# Patient Record
Sex: Male | Born: 1955
Health system: Southern US, Community
[De-identification: ages and names within clinical notes are randomized; demographics above are authoritative.]

## PROBLEM LIST (undated history)

## (undated) DIAGNOSIS — K922 Gastrointestinal hemorrhage, unspecified: Secondary | ICD-10-CM

## (undated) DIAGNOSIS — K760 Fatty (change of) liver, not elsewhere classified: Secondary | ICD-10-CM

## (undated) DIAGNOSIS — E669 Obesity, unspecified: Secondary | ICD-10-CM

## (undated) DIAGNOSIS — H269 Unspecified cataract: Secondary | ICD-10-CM

## (undated) DIAGNOSIS — E7800 Pure hypercholesterolemia, unspecified: Secondary | ICD-10-CM

## (undated) DIAGNOSIS — K2970 Gastritis, unspecified, without bleeding: Secondary | ICD-10-CM

## (undated) DIAGNOSIS — E291 Testicular hypofunction: Secondary | ICD-10-CM

## (undated) DIAGNOSIS — E66812 Obesity, class 2: Secondary | ICD-10-CM

## (undated) DIAGNOSIS — B9681 Helicobacter pylori [H. pylori] as the cause of diseases classified elsewhere: Secondary | ICD-10-CM

## (undated) DIAGNOSIS — N200 Calculus of kidney: Secondary | ICD-10-CM

## (undated) DIAGNOSIS — K635 Polyp of colon: Secondary | ICD-10-CM

## (undated) DIAGNOSIS — M1990 Unspecified osteoarthritis, unspecified site: Secondary | ICD-10-CM

## (undated) DIAGNOSIS — K219 Gastro-esophageal reflux disease without esophagitis: Secondary | ICD-10-CM

## (undated) DIAGNOSIS — R1011 Right upper quadrant pain: Secondary | ICD-10-CM

## (undated) HISTORY — DX: Pure hypercholesterolemia, unspecified: E78.00

## (undated) HISTORY — DX: Unspecified cataract: H26.9

## (undated) HISTORY — DX: Gastritis, unspecified, without bleeding: K29.70

## (undated) HISTORY — PX: UPPER GASTROINTESTINAL ENDOSCOPY: SHX188

## (undated) HISTORY — DX: Testicular hypofunction: E29.1

## (undated) HISTORY — PX: KNEE SURGERY: SHX244

## (undated) HISTORY — DX: Right upper quadrant pain: R10.11

## (undated) HISTORY — DX: Obesity, unspecified: E66.9

## (undated) HISTORY — DX: Polyp of colon: K63.5

## (undated) HISTORY — DX: Fatty (change of) liver, not elsewhere classified: K76.0

## (undated) HISTORY — PX: COLONOSCOPY: SHX174

## (undated) HISTORY — DX: Obesity, class 2: E66.812

## (undated) HISTORY — DX: Helicobacter pylori (H. pylori) as the cause of diseases classified elsewhere: B96.81

## (undated) HISTORY — PX: POLYPECTOMY: SHX149

## (undated) HISTORY — DX: Gastro-esophageal reflux disease without esophagitis: K21.9

## (undated) HISTORY — DX: Calculus of kidney: N20.0

## (undated) HISTORY — DX: Unspecified osteoarthritis, unspecified site: M19.90

## (undated) HISTORY — PX: ESOPHAGOGASTRODUODENOSCOPY: SHX1529

## (undated) HISTORY — DX: Gastrointestinal hemorrhage, unspecified: K92.2

---

## 1976-12-11 HISTORY — PX: TONSILLECTOMY: SUR1361

## 1979-12-12 HISTORY — PX: NASAL SEPTUM SURGERY: SHX37

## 1998-12-11 HISTORY — PX: LUMBAR SPINE SURGERY: SHX701

## 1999-04-07 ENCOUNTER — Encounter: Payer: Self-pay | Admitting: Neurosurgery

## 1999-04-08 ENCOUNTER — Encounter: Payer: Self-pay | Admitting: Neurosurgery

## 1999-04-08 ENCOUNTER — Inpatient Hospital Stay (HOSPITAL_COMMUNITY): Admission: RE | Admit: 1999-04-08 | Discharge: 1999-04-09 | Payer: Self-pay | Admitting: Neurosurgery

## 1999-04-25 ENCOUNTER — Encounter: Admission: RE | Admit: 1999-04-25 | Discharge: 1999-07-22 | Payer: Self-pay | Admitting: Neurosurgery

## 1999-06-15 ENCOUNTER — Encounter: Admission: RE | Admit: 1999-06-15 | Discharge: 1999-07-22 | Payer: Self-pay | Admitting: Specialist

## 1999-08-16 ENCOUNTER — Encounter: Admission: RE | Admit: 1999-08-16 | Discharge: 1999-09-26 | Payer: Self-pay | Admitting: Specialist

## 2004-03-17 ENCOUNTER — Inpatient Hospital Stay (HOSPITAL_COMMUNITY): Admission: AD | Admit: 2004-03-17 | Discharge: 2004-03-19 | Payer: Self-pay | Admitting: Orthopedic Surgery

## 2008-01-07 ENCOUNTER — Emergency Department (HOSPITAL_COMMUNITY): Admission: EM | Admit: 2008-01-07 | Discharge: 2008-01-07 | Payer: Self-pay | Admitting: Emergency Medicine

## 2008-12-11 HISTORY — PX: CHOLECYSTECTOMY: SHX55

## 2008-12-24 ENCOUNTER — Ambulatory Visit: Payer: Self-pay | Admitting: Family Medicine

## 2008-12-24 DIAGNOSIS — R109 Unspecified abdominal pain: Secondary | ICD-10-CM | POA: Insufficient documentation

## 2008-12-25 ENCOUNTER — Ambulatory Visit: Payer: Self-pay | Admitting: Gastroenterology

## 2008-12-25 DIAGNOSIS — R1011 Right upper quadrant pain: Secondary | ICD-10-CM

## 2008-12-28 ENCOUNTER — Ambulatory Visit (HOSPITAL_COMMUNITY): Admission: RE | Admit: 2008-12-28 | Discharge: 2008-12-28 | Payer: Self-pay | Admitting: Gastroenterology

## 2008-12-28 DIAGNOSIS — K8020 Calculus of gallbladder without cholecystitis without obstruction: Secondary | ICD-10-CM | POA: Insufficient documentation

## 2008-12-28 LAB — CONVERTED CEMR LAB
ALT: 31 units/L (ref 0–53)
Basophils Absolute: 0.1 10*3/uL (ref 0.0–0.1)
CO2: 32 meq/L (ref 19–32)
Calcium: 9.4 mg/dL (ref 8.4–10.5)
Chloride: 103 meq/L (ref 96–112)
GFR calc Af Amer: 100 mL/min
Lymphocytes Relative: 26.4 % (ref 12.0–46.0)
MCHC: 35.8 g/dL (ref 30.0–36.0)
Neutrophils Relative %: 58 % (ref 43.0–77.0)
Potassium: 4.1 meq/L (ref 3.5–5.1)
RBC: 4.69 M/uL (ref 4.22–5.81)
RDW: 12.3 % (ref 11.5–14.6)
Sodium: 140 meq/L (ref 135–145)
Total Protein: 6.8 g/dL (ref 6.0–8.3)

## 2008-12-30 ENCOUNTER — Encounter: Payer: Self-pay | Admitting: Gastroenterology

## 2009-01-08 ENCOUNTER — Encounter: Payer: Self-pay | Admitting: Gastroenterology

## 2009-01-20 ENCOUNTER — Encounter: Payer: Self-pay | Admitting: Gastroenterology

## 2009-01-20 ENCOUNTER — Ambulatory Visit: Payer: Self-pay | Admitting: Gastroenterology

## 2009-01-20 DIAGNOSIS — K635 Polyp of colon: Secondary | ICD-10-CM

## 2009-01-20 HISTORY — PX: COLONOSCOPY W/ POLYPECTOMY: SHX1380

## 2009-01-20 HISTORY — DX: Polyp of colon: K63.5

## 2009-01-25 ENCOUNTER — Encounter: Payer: Self-pay | Admitting: Gastroenterology

## 2009-03-09 ENCOUNTER — Encounter (INDEPENDENT_AMBULATORY_CARE_PROVIDER_SITE_OTHER): Payer: Self-pay | Admitting: General Surgery

## 2009-03-09 ENCOUNTER — Ambulatory Visit (HOSPITAL_COMMUNITY): Admission: RE | Admit: 2009-03-09 | Discharge: 2009-03-09 | Payer: Self-pay | Admitting: General Surgery

## 2009-08-04 ENCOUNTER — Encounter: Payer: Self-pay | Admitting: Gastroenterology

## 2011-04-25 NOTE — Op Note (Signed)
NAMEODES, LOLLI NO.:  [id number]   MEDICAL RECORD NO.:  [id number]          PATIENT TYPE:  AMB   LOCATION:  DAY                          FACILITY:  [facility]   PHYSICIAN:  Sharlet Salina T. Hoxworth, M.D.DATE OF BIRTH:  Jul 08, 1956   DATE OF PROCEDURE:  03/09/2009  DATE OF DISCHARGE:                               OPERATIVE REPORT   PREOPERATIVE DIAGNOSES:  Cholelithiasis and cholecystitis.   POSTOPERATIVE DIAGNOSES:  Cholelithiasis and cholecystitis.   SURGICAL PROCEDURES:  Laparoscopic cholecystectomy with intraoperative  cholangiogram.   SURGEON:  Sharlet Salina T. Hoxworth, M.D.   ASSISTANTAngelia Mould. Derrell Lolling, M.D.   ANESTHESIA:  General.   BRIEF HISTORY:  Mr. Sabino is a 55 year old male with some persistent  episodic right upper quadrant postprandial discomfort over several  months.  He had a workup including a gallbladder ultrasound revealing  multiple gallstones.  He is felt to likely have symptomatic  cholelithiasis and I have recommended proceeding with laparoscopic  cholecystectomy with cholangiogram.  The nature of the procedure, its  indications, the risks of bleeding, infection, bile leak, bile duct  injury were discussed and understood.  He is now brought to the  operating room for this procedure.   DESCRIPTION OF OPERATION:  The patient was brought to the operating  room. placed in the supine position on the operating room table and  general endotracheal anesthesia was induced.  The abdomen was widely  sterilely prepped and draped.  Local anesthesia was used to infiltrate  the trocar sites.  A 1 cm incision was made at the umbilicus and  dissection was carried down to the midline fascia which was sharply  incised 1 cm and the peritoneum entered under direct vision.  Through a  mattress suture of zero Vicryl the Hasson trocar was placed and  pneumoperitoneum established.  Under direct vision an 11 mm trocar was  placed in the subxiphoid area and two 5  mm trocars along the right  subcostal margin.  The gallbladder was visualized and did not appear  acutely inflamed.  The fundus was grasped and elevated up over the  liver.  There were some omental adhesions up to the gallbladder that  were taken down with cautery and then the infundibulum retracted  inferolaterally.  The peritoneum anterior and posterior to Calot  triangle was incised and fibrofatty tissue was stripped off the neck of  the gallbladder toward the porta hepatis.  Calot triangle was thoroughly  dissected and the cystic artery identified coursing up on the  gallbladder wall.  The cystic duct gallbladder junction was dissected  360 degrees and the cystic duct dissected out over about a centimeter.  When the anatomy was clear the cystic artery was doubly clipped  proximally, clipped distally and the cystic duct clipped at the  gallbladder junction.  An operative cholangiogram was then obtained  through the cystic duct which showed good filling of common bile duct,  intrahepatic ducts with free flow into the duodenum and no filling  defects.  Following this, the Cholangiocath was removed and the cystic  duct was triply clipped  proximally and divided and the cystic artery was  divided.  The gallbladder then dissected free from its bed using hook  cautery and removed through the umbilicus.  The right upper quadrant was  thoroughly irrigated and complete hemostasis was assured.  The trocars  were removed and all CO2 evacuated and the mattress suture secured in  the umbilicus.  The skin incisions were closed with subcuticular Monocryl and Dermabond.  Sponge, needle and instrument counts correct.  The patient was taken to recovery in good condition.      Lorne Skeens. Hoxworth, M.D.  Electronically Signed     BTH/MEDQ  D:  03/09/2009  T:  03/09/2009  Job:  [id number]   cc:   Rachael Fee, MD  [address]  [place], [place] [address]   Donetta Potts, D.O.

## 2011-04-28 NOTE — Op Note (Signed)
NAME:  Jordan Berg, Jordan Berg NO.:  [id number]   MEDICAL RECORD NO.:  [id number]                   PATIENT TYPE:  INP   LOCATION:  5028                                 FACILITY:  [facility]   PHYSICIAN:  Katy Fitch. Naaman Plummer., M.D.          DATE OF BIRTH:  [birth year]   DATE OF PROCEDURE:  03/17/2004  DATE OF DISCHARGE:                                 OPERATIVE REPORT   PREOPERATIVE DIAGNOSIS:  Acute septic flexor tenosynovitis left index finger  status post trauma on March 15, 2004, with development of acute septic flexor  tenosynovitis identified at Occup[facility] and referred for an urgent  hand trauma activation consultation for incision and drainage.   POSTOPERATIVE DIAGNOSIS:  Acute septic flexor tenosynovitis left index  finger status post trauma on March 15, 2004, with development of acute septic  flexor tenosynovitis identified at Occupational Health and referred for an  urgent hand trauma activation consultation for incision and drainage.   OPERATION:  Incision and drainage of left index finger flexor sheath with  aerobic and anaerobic culture and subsequent antibiotic solution irrigation  with implantation of a #5 pediatric feeding tube.   SURGEON:  Katy Fitch. Sypher, M.D.   ASSISTANT:  Marveen Reeks. Dasnoit, P.A.-C.   ANESTHESIA:  General by LMA.   ANESTHESIOLOGIST:  Zenon Mayo, MD   INDICATIONS FOR PROCEDURE:  Jordan Berg is a 55 year old [employer] employee  who, on March 15, 2004, sustained a crushing injury to his left index finger.  He split the skin at the P2 segment adjacent to the distal interphalangeal  joint flexion crease and was seen by his plant nurse.  His wound was cleaned  and dressed.  He was not placed on any antibiotic therapy.  He subsequently  developed an acute flexor tenosynovitis within 36 hours and presented to  [facility] today with severe pain, swelling, and rubor.  A  hand trauma activation  consultation was requested.   On examination, he was noted to have all the cardinal signs of flexor  tenosynovitis with significant pain.  His CBC revealed an elevated white  count at 11,100.  His laboratory studies were, otherwise, within normal  limits.  Arrangements were made of urgent incision and drainage followed by  antibiotic irrigation of his flexor sheath and implantation of a pediatric  feeding tube for continuous drainage.   PROCEDURE:  Jordan Berg is brought to the operating room and placed on supine  position on the operating table.  Following induction of general anesthesia  by LMA, the left arm was prepped with Betadine solution and sterilely  draped.  Following exsanguination of the left arm by elevation for a few  minutes, the arterial tourniquet was inflated to 240 mmHg.  The procedure  commenced with a short transverse incision at the level of the A1 pulley  proximally.  The flexor sheath was identified at this level after  careful  retraction of the neurovascular bundles.  The flexor sheath was bulging with  turbid seropurulent fluid.  The membranes of the retinacular sheath were  incised and aerobic and anaerobic culture specimens were obtained.  The area  of the traumatic wound was then explored with a Brunners zig-zag incision.  Once again, purulent fluid was identified within the flexor sheath.  A #5  pediatric feeding tube was threaded deep to the A2 pulley and used to  continuously irrigate the flexor sheath until the affluent was clear.  The  catheter was then sewn in place with a 5-0 nylon suture.  The distal wound  was loosely closed to cover the flexor sheath with a single corner suture of  5-0 nylon.  The finger was dressed with Xeroform, sterile gauze, and a  voluminous dressing.  Jordan Berg was given 3 grams of Unasyn IV and will  begin an IV antibiotic regimen with Unasyn q.6h.  He will be admitted to the  5000 orthopedic floor for observation of his vital  signs and appropriate  analgesic management.                                               Katy Fitch Naaman Plummer., M.D.    RVS/MEDQ  D:  03/17/2004  T:  03/17/2004  Job:  [id number]

## 2014-01-28 ENCOUNTER — Emergency Department
Admission: EM | Admit: 2014-01-28 | Discharge: 2014-01-28 | Disposition: A | Discharge status: Home / Self Care | Payer: BC Managed Care – PPO | Source: Home / Self Care | Attending: Family Medicine | Admitting: Family Medicine

## 2014-01-28 ENCOUNTER — Encounter: Payer: Self-pay | Admitting: Emergency Medicine

## 2014-01-28 DIAGNOSIS — K219 Gastro-esophageal reflux disease without esophagitis: Secondary | ICD-10-CM

## 2014-01-28 DIAGNOSIS — R079 Chest pain, unspecified: Secondary | ICD-10-CM

## 2014-01-28 MED ORDER — OMEPRAZOLE 40 MG PO CPDR: DELAYED_RELEASE_CAPSULE | ORAL | Status: DC

## 2014-01-28 NOTE — Discharge Instructions (Signed)
Gastroesophageal Reflux Disease, Adult  Gastroesophageal reflux disease (GERD) happens when acid from your stomach flows up into the esophagus. When acid comes in contact with the esophagus, the acid causes soreness (inflammation) in the esophagus. Over time, GERD may create small holes (ulcers) in the lining of the esophagus.  CAUSES   · Increased body weight. This puts pressure on the stomach, making acid rise from the stomach into the esophagus.  · Smoking. This increases acid production in the stomach.  · Drinking alcohol. This causes decreased pressure in the lower esophageal sphincter (valve or ring of muscle between the esophagus and stomach), allowing acid from the stomach into the esophagus.  · Late evening meals and a full stomach. This increases pressure and acid production in the stomach.  · A malformed lower esophageal sphincter.  Sometimes, no cause is found.  SYMPTOMS   · Burning pain in the lower part of the mid-chest behind the breastbone and in the mid-stomach area. This may occur twice a week or more often.  · Trouble swallowing.  · Sore throat.  · Dry cough.  · Asthma-like symptoms including chest tightness, shortness of breath, or wheezing.  DIAGNOSIS   Your caregiver may be able to diagnose GERD based on your symptoms. In some cases, X-rays and other tests may be done to check for complications or to check the condition of your stomach and esophagus.  TREATMENT   Your caregiver may recommend over-the-counter or prescription medicines to help decrease acid production. Ask your caregiver before starting or adding any new medicines.   HOME CARE INSTRUCTIONS   · Change the factors that you can control. Ask your caregiver for guidance concerning weight loss, quitting smoking, and alcohol consumption.  · Avoid foods and drinks that make your symptoms worse, such as:  · Caffeine or alcoholic drinks.  · Chocolate.  · Peppermint or mint flavorings.  · Garlic and onions.  · Spicy foods.  · Citrus fruits,  such as oranges, lemons, or limes.  · Tomato-based foods such as sauce, chili, salsa, and pizza.  · Fried and fatty foods.  · Avoid lying down for the 3 hours prior to your bedtime or prior to taking a nap.  · Eat small, frequent meals instead of large meals.  · Wear loose-fitting clothing. Do not wear anything tight around your waist that causes pressure on your stomach.  · Raise the head of your bed 6 to 8 inches with wood blocks to help you sleep. Extra pillows will not help.  · Only take over-the-counter or prescription medicines for pain, discomfort, or fever as directed by your caregiver.  · Do not take aspirin, ibuprofen, or other nonsteroidal anti-inflammatory drugs (NSAIDs).  SEEK IMMEDIATE MEDICAL CARE IF:   · You have pain in your arms, neck, jaw, teeth, or back.  · Your pain increases or changes in intensity or duration.  · You develop nausea, vomiting, or sweating (diaphoresis).  · You develop shortness of breath, or you faint.  · Your vomit is green, yellow, black, or looks like coffee grounds or blood.  · Your stool is red, bloody, or black.  These symptoms could be signs of other problems, such as heart disease, gastric bleeding, or esophageal bleeding.  MAKE SURE YOU:   · Understand these instructions.  · Will watch your condition.  · Will get help right away if you are not doing well or get worse.  Document Released: 09/06/2005 Document Revised: 02/19/2012 Document Reviewed: 06/16/2011  [employer]® Patient   Information ©2014 [employer].

## 2014-01-28 NOTE — ED Notes (Signed)
Throat burning, pressure from stomach up to back of throat and roof of mouth, tenderness x 4 days

## 2014-01-28 NOTE — ED Provider Notes (Signed)
CSN: [id number]     Arrival date & time 01/28/14  1353 History   First MD Initiated Contact with Patient 01/28/14 1530     Chief Complaint  Patient presents with  . Gastrophageal Reflux        HPI Comments: Patient complains of 3 to 4 days history of recurring burning sensation in his throat, especially upon awakening, with pressure-like sensation in his abdomen radiating up to his throat.  He has had occasional nausea without vomiting.  The discomfort does not radiate.  He has mild shortness of breath when the discomfort is worse.  His symptoms are not exacerbated by activity. Family history of MI in paternal grandfather and maternal grandmother.  Patient is a 59 y.o. male presenting with chest pain. The history is provided by the patient.  Chest Pain Pain location:  Substernal area Pain quality: burning and pressure   Radiates to: throat. Pain radiates to the back: no   Pain severity:  Mild Onset quality:  Gradual Timing:  Intermittent Progression:  Waxing and waning Chronicity:  Recurrent Context: at rest   Context: not breathing, no drug use, not eating, not lifting, no movement, not raising an arm, no stress and no trauma   Relieved by:  None tried Worsened by:  Nothing tried Ineffective treatments:  None tried Associated symptoms: abdominal pain, AICD problem, dysphagia, heartburn and nausea   Associated symptoms: no anorexia, no back pain, no cough, no diaphoresis, no dizziness, no fatigue, no fever, no lower extremity edema, no palpitations, no shortness of breath and not vomiting   Risk factors: male sex and obesity     History reviewed. No pertinent past medical history. History reviewed. No pertinent past surgical history. No family history on file. History  Substance Use Topics  . Smoking status: Never Smoker   . Smokeless tobacco: Not on file  . Alcohol Use: No    Review of Systems  Constitutional: Negative for fever, diaphoresis and fatigue.  HENT: Positive  for trouble swallowing.   Respiratory: Negative for cough and shortness of breath.   Cardiovascular: Positive for chest pain. Negative for palpitations.  Gastrointestinal: Positive for heartburn, nausea and abdominal pain. Negative for vomiting and anorexia.  Musculoskeletal: Negative for back pain.  Neurological: Negative for dizziness.  All other systems reviewed and are negative.      Allergies  Levofloxacin  Home Medications   Current Outpatient Rx  Name  Route  Sig  Dispense  Refill  . Testosterone (FORTESTA) 10 MG/ACT (2%) GEL   Transdermal   Place onto the skin.         Marland Kitchen omeprazole (PRILOSEC) 40 MG capsule      Take one capsule by mouth 30 minutes before evening meal   30 capsule   1    BP 160/78  Pulse 82  Temp(Src) 97.8 F (36.6 C) (Oral)  Ht 5\' 11"  (1.803 m)  Wt 248 lb (112.492 kg)  BMI 34.60 kg/m2  SpO2 97% Physical Exam Nursing notes and Vital Signs reviewed. Appearance:  Patient appears stated age, and in no acute distress.  Patient is obese (BMI 34.6) Eyes:  Pupils are equal, round, and reactive to light and accomodation.  Extraocular movement is intact.  Conjunctivae are not inflamed  Ears:  Canals normal.  Tympanic membranes normal.  Nose:   Normal  Pharynx:  Normal Neck:  Supple.  No adenopathy or thyromegaly Lungs:  Clear to auscultation.  Breath sounds are equal.  Heart:  Regular rate and rhythm  without murmurs, rubs, or gallops.  Abdomen:   Mild sub-xiphoid tenderness without masses or hepatosplenomegaly.  Bowel sounds are present.  No CVA or flank tenderness.  Extremities:  No edema.  No calf tenderness Skin:  No rash present.   ED Course  Procedures  None  EKG: RATE:  82 PR:  0.172 QT:  0.356 QRS:  0.10 Axis:  9 degrees Interpretation:  NSR; WNL           MDM   Final diagnoses:  Chest pain  GERD (gastroesophageal reflux disease)    Begin omeprazole 40mg  30 minutes before evening meal. Followup with Family Doctor if  not improved in two weeks.    Kandra Nicolas, MD 01/30/14 1800

## 2014-03-16 ENCOUNTER — Other Ambulatory Visit: Payer: Self-pay | Admitting: Family Medicine

## 2014-04-27 ENCOUNTER — Other Ambulatory Visit: Payer: Self-pay | Admitting: Family Medicine

## 2015-12-09 ENCOUNTER — Emergency Department
Admission: EM | Admit: 2015-12-09 | Discharge: 2015-12-09 | Disposition: A | Discharge status: Home / Self Care | Payer: BLUE CROSS/BLUE SHIELD | Source: Home / Self Care | Attending: Family Medicine | Admitting: Family Medicine

## 2015-12-09 ENCOUNTER — Encounter: Payer: Self-pay | Admitting: Emergency Medicine

## 2015-12-09 DIAGNOSIS — J069 Acute upper respiratory infection, unspecified: Secondary | ICD-10-CM | POA: Diagnosis not present

## 2015-12-09 DIAGNOSIS — H6983 Other specified disorders of Eustachian tube, bilateral: Secondary | ICD-10-CM | POA: Diagnosis not present

## 2015-12-09 DIAGNOSIS — J0110 Acute frontal sinusitis, unspecified: Secondary | ICD-10-CM

## 2015-12-09 DIAGNOSIS — B9789 Other viral agents as the cause of diseases classified elsewhere: Secondary | ICD-10-CM

## 2015-12-09 MED ORDER — TRIAMCINOLONE ACETONIDE 40 MG/ML IJ SUSP
40.0000 mg | Freq: Once | INTRAMUSCULAR | Status: AC
  Administered 2015-12-09: 40 mg via INTRAMUSCULAR

## 2015-12-09 MED ORDER — CEFDINIR 300 MG PO CAPS: 300.0000 mg | ORAL_CAPSULE | Freq: Two times a day (BID) | ORAL | Status: DC

## 2015-12-09 NOTE — Discharge Instructions (Signed)
Take plain guaifenesin (1200mg  extended release tabs such as Mucinex) twice daily, with plenty of water, for cough and congestion.  May add Pseudoephedrine (30mg , one or two every 4 to 6 hours) for sinus congestion.  Get adequate rest.   May use Afrin nasal spray (or generic oxymetazoline) twice daily for about 5 days and then discontinue.  Also recommend using saline nasal spray several times daily and saline nasal irrigation (AYR is a common brand).  Use Flonase nasal spray each morning after using Afrin nasal spray and saline nasal irrigation. Try warm salt water gargles for sore throat.  Stop all antihistamines for now, and other non-prescription cough/cold preparations. May take Delsym Cough Suppressant at bedtime for nighttime cough.  Follow-up with ENT physician if not improved about 10 days

## 2015-12-09 NOTE — ED Provider Notes (Signed)
CSN: [id number]     Arrival date & time 12/09/15  [id number] History   First MD Initiated Contact with Patient 12/09/15 [phone]     Chief Complaint  Patient presents with  . Otalgia      HPI Comments:  About 6 days ago patient developed typical cold-like symptoms including mild sore throat, sinus congestion, headache, fatigue, and cough.  He has developed increasing pressure in his forehead and cheeks, and his upper teeth hurt.  He has had chills during the past two days.  He states that his ears feel full and he is unable to equalize pressure in his ears.  The history is provided by the patient.    History reviewed. No pertinent past medical history. History reviewed. No pertinent past surgical history. No family history on file. Social History  Substance Use Topics  . Smoking status: Never Smoker   . Smokeless tobacco: None  . Alcohol Use: No    Review of Systems + sore throat + cough No pleuritic pain No wheezing + nasal congestion + post-nasal drainage + sinus pain/pressure No itchy/red eyes ? earache No hemoptysis No SOB No fever, + chills No nausea No vomiting No abdominal pain No diarrhea No urinary symptoms No skin rash + fatigue + myalgias + headache Used OTC meds without relief  Allergies  Levofloxacin  Home Medications   Prior to Admission medications   Medication Sig Start Date End Date Taking? Authorizing Provider  cefdinir (OMNICEF) 300 MG capsule Take 1 capsule (300 mg total) by mouth 2 (two) times daily. 12/09/15   Kandra Nicolas, MD  omeprazole (PRILOSEC) 40 MG capsule Take one capsule by mouth 30 minutes before evening meal 01/28/14   Kandra Nicolas, MD  Testosterone (FORTESTA) 10 MG/ACT (2%) GEL Place onto the skin.    Historical Provider, MD   Meds Ordered and Administered this Visit   Medications  triamcinolone acetonide (KENALOG-40) injection 40 mg (not administered)    BP 132/87 mmHg  Pulse 82  Temp(Src) 97.9 F (36.6 C) (Oral)  Ht 5'  11" (1.803 m)  Wt 247 lb (112.038 kg)  BMI 34.46 kg/m2  SpO2 96% No data found.   Physical Exam Nursing notes and Vital Signs reviewed. Appearance:  Patient appears stated age, and in no acute distress Eyes:  Pupils are equal, round, and reactive to light and accomodation.  Extraocular movement is intact.  Conjunctivae are not inflamed  Ears:  Canals normal.  Tympanic membranes normal.  Nose:  ongested turbinates.   Maxillary and frontal sinus tenderness is present.  Pharynx:  Normal Neck:  Supple.   Nontender enlarged posterior nodes are palpated bilaterally  Lungs:  Clear to auscultation.  Breath sounds are equal.  Moving air well. Heart:  Regular rate and rhythm without murmurs, rubs, or gallops.  Abdomen:  Nontender without masses or hepatosplenomegaly.  Bowel sounds are present.  No CVA or flank tenderness.  Extremities:  No edema.  No calf tenderness Skin:  No rash present.   ED Course  Procedures  None    Labs Reviewed -  Tympanogram:  Left ear normal; right ear high peak height.     MDM   1. Acute frontal sinusitis, recurrence not specified   2. Eustachian tube dysfunction, bilateral   3. Viral URI with cough    Kenalog 40mg  IM Begin Omnicef 300mg  BID  Take plain guaifenesin (1200mg  extended release tabs such as Mucinex) twice daily, with plenty of water, for cough and congestion.  May  add Pseudoephedrine (30mg , one or two every 4 to 6 hours) for sinus congestion.  Get adequate rest.   May use Afrin nasal spray (or generic oxymetazoline) twice daily for about 5 days and then discontinue.  Also recommend using saline nasal spray several times daily and saline nasal irrigation (AYR is a common brand).    Try warm salt water gargles for sore throat.  Stop all antihistamines for now, and other non-prescription cough/cold preparations. May take Delsym Cough Suppressant at bedtime for nighttime cough.  Follow-up with ENT physician if not improved about 10  days    Kandra Nicolas, MD 12/09/15 [phone]

## 2015-12-09 NOTE — ED Notes (Signed)
Sinus pain, pressure, headache, ears hurt, eyes burning, headache x 1 week

## 2015-12-11 ENCOUNTER — Telehealth: Payer: Self-pay | Admitting: Emergency Medicine

## 2016-02-05 ENCOUNTER — Emergency Department
Admission: EM | Admit: 2016-02-05 | Discharge: 2016-02-05 | Disposition: A | Discharge status: Home / Self Care | Payer: BLUE CROSS/BLUE SHIELD | Source: Home / Self Care | Attending: Family Medicine | Admitting: Family Medicine

## 2016-02-05 ENCOUNTER — Encounter: Payer: Self-pay | Admitting: Emergency Medicine

## 2016-02-05 DIAGNOSIS — R1030 Lower abdominal pain, unspecified: Secondary | ICD-10-CM

## 2016-02-05 DIAGNOSIS — R195 Other fecal abnormalities: Secondary | ICD-10-CM | POA: Diagnosis not present

## 2016-02-05 LAB — POCT URINALYSIS DIP (MANUAL ENTRY)
Bilirubin, UA: NEGATIVE
Glucose, UA: NEGATIVE
Ketones, POC UA: NEGATIVE
Leukocytes, UA: NEGATIVE
Nitrite, UA: NEGATIVE
Protein Ur, POC: NEGATIVE
Spec Grav, UA: >=1.03 (ref 1.005–1.03)
Urobilinogen, UA: 0.2 (ref 0–1)
pH, UA: 5.5 (ref 5–8)

## 2016-02-05 LAB — POCT CBC W AUTO DIFF (K'VILLE URGENT CARE)

## 2016-02-05 LAB — COMPLETE METABOLIC PANEL WITH GFR
ALT: 32 U/L (ref 9–46)
AST: 28 U/L (ref 10–35)
Albumin: 4.4 g/dL (ref 3.6–5.1)
Alkaline Phosphatase: 39 U/L — ABNORMAL LOW (ref 40–115)
BUN: 16 mg/dL (ref 7–25)
CO2: 23 mmol/L (ref 20–31)
Calcium: 9.2 mg/dL (ref 8.6–10.3)
Chloride: 101 mmol/L (ref 98–110)
Creat: 1.25 mg/dL (ref 0.70–1.25)
GFR, Est African American: 71 mL/min (ref >=60)
GFR, Est Non African American: 62 mL/min (ref >=60)
Glucose, Bld: 83 mg/dL (ref 65–99)
Potassium: 4.4 mmol/L (ref 3.5–5.3)
Sodium: 137 mmol/L (ref 135–146)
Total Bilirubin: 0.9 mg/dL (ref 0.2–1.2)
Total Protein: 6.9 g/dL (ref 6.1–8.1)

## 2016-02-05 NOTE — ED Provider Notes (Signed)
CSN: [id number]     Arrival date & time 02/05/16  1040 History   First MD Initiated Contact with Patient 02/05/16 1129     Chief Complaint  Patient presents with  . Abdominal Pain   (Consider location/radiation/quality/duration/timing/severity/associated sxs/prior Treatment) HPI The pt is a 60yo male presenting to [facility] with c/o lower abdominal pain that is described as a discomfort and cramping/spasm sensation for about 1 week.  He also reports hx of loose stools for about 3-4 weeks. Denies blood or mucous in stool.  He does report a malodorous smell with urination but denies dysuria or hematuria. Denies concern for STDs. Does report hx of kidney stones but denies back pain. He does report getting a routine colonoscopy about 5-6 years ago and had a lot of polyps at that time but was advised not to f/u for another 10 years.  He also reports drinking a special drink with vinegar for about 1-2 weeks and noticed he has to have a BM within 1 hour of drinking the drink.  Denies upper abdominal pain, nausea, vomiting or fevers. Denies hx of diverticulitis. No sick contacts or recent travel. Denies hx of abdominal surgeries.   History reviewed. No pertinent past medical history. History reviewed. No pertinent past surgical history. History reviewed. No pertinent family history. Social History  Substance Use Topics  . Smoking status: Never Smoker   . Smokeless tobacco: None  . Alcohol Use: No    Review of Systems  Constitutional: Negative for fever and chills.  Gastrointestinal: Positive for abdominal pain and diarrhea ( loose stools). Negative for nausea, vomiting and blood in stool.  Genitourinary: Negative for dysuria, urgency, frequency, hematuria, flank pain, decreased urine volume, discharge, penile swelling, scrotal swelling, penile pain and testicular pain.  Musculoskeletal: Negative for myalgias and back pain.    Allergies  Levofloxacin  Home Medications   Prior to Admission  medications   Medication Sig Start Date End Date Taking? Authorizing Provider  cefdinir (OMNICEF) 300 MG capsule Take 1 capsule (300 mg total) by mouth 2 (two) times daily. 12/09/15   Kandra Nicolas, MD  omeprazole (PRILOSEC) 40 MG capsule Take one capsule by mouth 30 minutes before evening meal 01/28/14   Kandra Nicolas, MD  Testosterone (FORTESTA) 10 MG/ACT (2%) GEL Place onto the skin.    Historical Provider, MD   Meds Ordered and Administered this Visit  Medications - No data to display  BP 146/80 mmHg  Pulse 80  Temp(Src) 98.3 F (36.8 C) (Oral)  Resp 16  Ht 5\' 11"  (1.803 m)  Wt 242 lb 4 oz (109.884 kg)  BMI 33.80 kg/m2  SpO2 95% No data found.   Physical Exam  Constitutional: He appears well-developed and well-nourished.  HENT:  Head: Normocephalic and atraumatic.  Mouth/Throat: Oropharynx is clear and moist.  Eyes: Conjunctivae are normal. No scleral icterus.  Neck: Normal range of motion.  Cardiovascular: Normal rate, regular rhythm and normal heart sounds.   Pulmonary/Chest: Effort normal and breath sounds normal. No respiratory distress. He has no wheezes. He has no rales. He exhibits no tenderness.  Abdominal: Soft. Bowel sounds are normal. He exhibits no distension and no mass. There is tenderness. There is no rebound and no guarding.  Soft, non-distended. Minimal tenderness in suprapubic region. No rebound, guarding, or masses palpated. No CVAT  Musculoskeletal: Normal range of motion.  Neurological: He is alert.  Skin: Skin is warm and dry.  Nursing note and vitals reviewed.   ED Course  Procedures (  including critical care time)  Labs Review Labs Reviewed  POCT URINALYSIS DIP (MANUAL ENTRY) - Abnormal; Notable for the following:    Blood, UA small (*)    All other components within normal limits  URINE CULTURE  COMPLETE METABOLIC PANEL WITH GFR  POCT CBC W AUTO DIFF ([facility])    Imaging Review No results found.   MDM   1. Lower  abdominal pain   2. Loose stools    Pt c/o lower abdominal pain and loose stools. Reports drinking a vinegar drink for about 1-2 weeks. Malodorous urine but denies dysuria or hematuria.   Mild tenderness to suprapubic area w/o rebound, guarding or mass. No CVAT. Pt is afebrile. Moist mucous membranes.  Low concern for surgical abdomen at this time. Will get labs. CBC- WNL CMP- pending UA- small blood. Will send culture.  Advised pt we could try a trial of antibiotics for possible diverticulitis, however, unable to definitely dx without CT.  Also advised we would wait on labs and have pt stop drinking vinegar drink to see if any difference.  Pt opted to discontinue drinking vinegar and wait on labs.   Encouraged pt to call 911 or go to closest emergency department if symptoms worsen as he may need stat labs and/or CT scan.  Patient verbalized understanding and agreement with treatment plan.    Noland Fordyce, PA-C 02/05/16 1222

## 2016-02-05 NOTE — ED Notes (Signed)
Patient presents to the [facility] with c/o bladder spasms and a smell to urine he stated he has been drinking a drink with vinegar for a little over a week.

## 2016-02-05 NOTE — Discharge Instructions (Signed)

## 2016-02-06 LAB — URINE CULTURE
Colony Count: NO GROWTH
Organism ID, Bacteria: NO GROWTH

## 2016-02-07 ENCOUNTER — Telehealth: Payer: Self-pay | Admitting: *Deleted

## 2016-02-24 ENCOUNTER — Emergency Department
Admission: EM | Admit: 2016-02-24 | Discharge: 2016-02-24 | Disposition: A | Discharge status: Home / Self Care | Payer: BLUE CROSS/BLUE SHIELD | Source: Home / Self Care

## 2016-02-24 ENCOUNTER — Encounter: Payer: Self-pay | Admitting: Emergency Medicine

## 2016-02-24 DIAGNOSIS — H10023 Other mucopurulent conjunctivitis, bilateral: Secondary | ICD-10-CM | POA: Diagnosis not present

## 2016-02-24 DIAGNOSIS — H6593 Unspecified nonsuppurative otitis media, bilateral: Secondary | ICD-10-CM

## 2016-02-24 MED ORDER — POLYMYXIN B-TRIMETHOPRIM 10000-0.1 UNIT/ML-% OP SOLN: 1.0000 [drp] | OPHTHALMIC | Status: DC

## 2016-02-24 MED ORDER — FLUTICASONE PROPIONATE 50 MCG/ACT NA SUSP: 2.0000 | Freq: Every day | NASAL | Status: DC

## 2016-02-24 NOTE — Discharge Instructions (Signed)
Bacterial Conjunctivitis °Bacterial conjunctivitis, commonly called pink eye, is an inflammation of the clear membrane that covers the white part of the eye (conjunctiva). The inflammation can also happen on the underside of the eyelids. The blood vessels in the conjunctiva become inflamed, causing the eye to become red or pink. Bacterial conjunctivitis may spread easily from one eye to another and from person to person (contagious).  °CAUSES  °Bacterial conjunctivitis is caused by bacteria. The bacteria may come from your own skin, your upper respiratory tract, or from someone else with bacterial conjunctivitis. °SYMPTOMS  °The normally white color of the eye or the underside of the eyelid is usually pink or red. The pink eye is usually associated with irritation, tearing, and some sensitivity to light. Bacterial conjunctivitis is often associated with a thick, yellowish discharge from the eye. The discharge may turn into a crust on the eyelids overnight, which causes your eyelids to stick together. If a discharge is present, there may also be some blurred vision in the affected eye. °DIAGNOSIS  °Bacterial conjunctivitis is diagnosed by your caregiver through an eye exam and the symptoms that you report. Your caregiver looks for changes in the surface tissues of your eyes, which may point to the specific type of conjunctivitis. A sample of any discharge may be collected on a cotton-tip swab if you have a severe case of conjunctivitis, if your cornea is affected, or if you keep getting repeat infections that do not respond to treatment. The sample will be sent to a lab to see if the inflammation is caused by a bacterial infection and to see if the infection will respond to antibiotic medicines. °TREATMENT  °1. Bacterial conjunctivitis is treated with antibiotics. Antibiotic eyedrops are most often used. However, antibiotic ointments are also available. Antibiotics pills are sometimes used. Artificial tears or eye  washes may ease discomfort. °HOME CARE INSTRUCTIONS  °1. To ease discomfort, apply a cool, clean washcloth to your eye for 10-20 minutes, 3-4 times a day. °2. Gently wipe away any drainage from your eye with a warm, wet washcloth or a cotton ball. °3. Wash your hands often with soap and water. Use paper towels to dry your hands. °4. Do not share towels or washcloths. This may spread the infection. °5. Change or wash your pillowcase every day. °6. You should not use eye makeup until the infection is gone. °7. Do not operate machinery or drive if your vision is blurred. °8. Stop using contact lenses. Ask your caregiver how to sterilize or replace your contacts before using them again. This depends on the type of contact lenses that you use. °9. When applying medicine to the infected eye, do not touch the edge of your eyelid with the eyedrop bottle or ointment tube. °SEEK IMMEDIATE MEDICAL CARE IF:  °· Your infection has not improved within 3 days after beginning treatment. °· You had yellow discharge from your eye and it returns. °· You have increased eye pain. °· Your eye redness is spreading. °· Your vision becomes blurred. °· You have a fever or persistent symptoms for more than 2-3 days. °· You have a fever and your symptoms suddenly get worse. °· You have facial pain, redness, or swelling. °MAKE SURE YOU:  °· Understand these instructions. °· Will watch your condition. °· Will get help right away if you are not doing well or get worse. °  °This information is not intended to replace advice given to you by your health care provider. Make sure you   discuss any questions you have with your health care provider. °  °Document Released: 11/27/2005 Document Revised: 12/18/2014 Document Reviewed: 04/29/2012 °Elsevier Interactive Patient Education ©2016 [employer] ° °How to Use Eye Drops and Eye Ointments °HOW TO APPLY EYE DROPS °Follow these steps when applying eye drops: °2. Wash your hands. °3. Tilt your head  back. °4. Put a finger under your eye and use it to gently pull your lower lid downward. Keep that finger in place. °5. Using your other hand, hold the dropper between your thumb and index finger. °6. Position the dropper just over the edge of the lower lid. Hold it as close to your eye as you can without touching the dropper to your eye. °7. Steady your hand. One way to do this is to lean your index finger against your brow. °8. Look up. °9. Slowly and gently squeeze one drop of medicine into your eye. °10. Close your eye. °11. Place a finger between your lower eyelid and your nose. Press gently for 2 minutes. This increases the amount of time that the medicine is exposed to the eye. It also reduces side effects that can develop if the drop gets into the bloodstream through the nose. °HOW TO APPLY EYE OINTMENTS °Follow these steps when applying eye ointments: °10. Wash your hands. °11. Put a finger under your eye and use it to gently pull your lower lid downward. Keep that finger in place. °12. Using your other hand, place the tip of the tube between your thumb and index finger with the remaining fingers braced against your cheek or nose. °13. Hold the tube just over the edge of your lower lid without touching the tube to your lid or eyeball. °14. Look up. °15. Line the inner part of your lower lid with ointment. °16. Gently pull up on your upper lid and look down. This will force the ointment to spread over the surface of the eye. °17. Release the upper lid. °18. If you can, close your eyes for 1-2 minutes. °Do not rub your eyes. If you applied the ointment correctly, your vision will be blurry for a few minutes. This is normal. °ADDITIONAL INFORMATION °· Make sure to use the eye drops or ointment as told by your health care provider. °· If you have been told to use both eye drops and an eye ointment, apply the eye drops first, then wait 3-4 minutes before you apply the ointment. °· Try not to touch the tip of the  dropper or tube to your eye. A dropper or tube that has touched the eye can become contaminated. °  °This information is not intended to replace advice given to you by your health care provider. Make sure you discuss any questions you have with your health care provider. °  °Document Released: 03/05/2001 Document Revised: 04/13/2015 Document Reviewed: 11/23/2014 °Elsevier Interactive Patient Education ©2016 [employer] ° °

## 2016-02-24 NOTE — ED Provider Notes (Signed)
CSN: [id number]     Arrival date & time 02/24/16  1329 History   None    Chief Complaint  Patient presents with  . Eye Problem   (Consider location/radiation/quality/duration/timing/severity/associated sxs/prior Treatment) HPI The pt is a pleasant 60yo male presenting today with c/o bilateral eye redness, burning, itching, and discharge that started about 2 days ago, associated sinus congestion.  He notes his wife was just treated for the same symptoms and prescribed Polytrim.  Her symptoms have since resolved.  He denies cough, fever, chills, sore throat or ear pain but does have occasional ear "fullness."   He wears glasses but not contacts. Denies trauma to his eyes.   History reviewed. No pertinent past medical history. History reviewed. No pertinent past surgical history. History reviewed. No pertinent family history. Social History  Substance Use Topics  . Smoking status: Never Smoker   . Smokeless tobacco: None  . Alcohol Use: No    Review of Systems  Constitutional: Negative for fever and chills.  HENT: Positive for congestion ( mild nasal), ear pain ( "fullness") and rhinorrhea. Negative for sneezing and sore throat.   Eyes: Positive for pain, discharge, redness, itching and visual disturbance. Negative for photophobia.  Respiratory: Negative for cough and wheezing.   Gastrointestinal: Negative for nausea, vomiting and diarrhea.  Neurological: Negative for dizziness, light-headedness and headaches.    Allergies  Levofloxacin  Home Medications   Prior to Admission medications   Medication Sig Start Date End Date Taking? Authorizing Provider  cefdinir (OMNICEF) 300 MG capsule Take 1 capsule (300 mg total) by mouth 2 (two) times daily. 12/09/15   Kandra Nicolas, MD  fluticasone (FLONASE) 50 MCG/ACT nasal spray Place 2 sprays into both nostrils daily. For at least 2 weeks, or seasonally 02/24/16   Noland Fordyce, PA-C  omeprazole (PRILOSEC) 40 MG capsule Take one capsule by  mouth 30 minutes before evening meal 01/28/14   Kandra Nicolas, MD  Testosterone (FORTESTA) 10 MG/ACT (2%) GEL Place onto the skin.    Historical Provider, MD  trimethoprim-polymyxin b (POLYTRIM) ophthalmic solution Place 1 drop into both eyes every 4 (four) hours. For 5-7 days 02/24/16   Noland Fordyce, PA-C   Meds Ordered and Administered this Visit  Medications - No data to display  BP 133/87 mmHg  Pulse 97  Temp(Src) 98.2 F (36.8 C) (Oral)  Ht 5\' 11"  (1.803 m)  Wt 250 lb (113.399 kg)  BMI 34.88 kg/m2  SpO2 96% No data found.   Physical Exam  Constitutional: He appears well-developed and well-nourished.  HENT:  Head: Normocephalic and atraumatic.  Right Ear: Tympanic membrane normal.  Left Ear: Tympanic membrane normal.  Nose: Rhinorrhea present. Right sinus exhibits no maxillary sinus tenderness and no frontal sinus tenderness. Left sinus exhibits no maxillary sinus tenderness and no frontal sinus tenderness.  Mouth/Throat: Uvula is midline, oropharynx is clear and moist and mucous membranes are normal.  Eyes: EOM are normal. Pupils are equal, round, and reactive to light. Right eye exhibits discharge. Left eye exhibits discharge. Right conjunctiva is injected. Left conjunctiva is injected. No scleral icterus.  Bilateral eyes: injected. Right eye: moderate amount of thick yellow-white discharge.  Left eye: minimal amount of thick yellow-white discharge.  No periorbital edema or tenderness.  Neck: Normal range of motion. Neck supple.  Cardiovascular: Normal rate, regular rhythm and normal heart sounds.   Pulmonary/Chest: Effort normal and breath sounds normal. No stridor. No respiratory distress. He has no wheezes. He has no rales.  Abdominal: Soft. He exhibits no distension. There is no tenderness.  Musculoskeletal: Normal range of motion.  Lymphadenopathy:    He has no cervical adenopathy.  Neurological: He is alert.  Skin: Skin is warm and dry.  Nursing note and vitals  reviewed.   ED Course  Procedures (including critical care time)  Labs Review Labs Reviewed - No data to display  Imaging Review No results found.   Visual Acuity Review  Right Eye Distance: 20/25 Left Eye Distance: 20/30 Bilateral Distance: 20/25 (with correction)   MDM   1. Acute bacterial conjunctivitis, bilateral   2. Middle ear effusion, bilateral    Pt c/o bilateral eye redness, pain, and itching.  Wife recently treated for conjunctivitis.   Exam c/w bacterial conjunctivitis w/o evidence of periorbital cellulitis.  Encouraged good hand washing between cleaning away discharge and when applying antibiotic eyedrops.  Rx: Polytrim and Flonase (pt c/o bilateral ear fullness, no evidence of AOM) also discussed use of sinus rinses.  Encouraged to f/u with PCP in 1 week if not improving, sooner if worsening. No indication for PO antibiotics at this time, however, if sinus congestion or symptoms worsen or persistent fever develops, PO antibiotics may be called in.    Noland Fordyce, PA-C 02/24/16 1444

## 2016-02-24 NOTE — ED Notes (Addendum)
Bi-lateral eye redness, itching, burning, discharge x 2 days, Sinus congestion

## 2016-03-01 ENCOUNTER — Emergency Department
Admission: EM | Admit: 2016-03-01 | Discharge: 2016-03-01 | Disposition: A | Discharge status: Home / Self Care | Payer: BLUE CROSS/BLUE SHIELD | Source: Home / Self Care | Attending: Family Medicine | Admitting: Family Medicine

## 2016-03-01 ENCOUNTER — Encounter: Payer: Self-pay | Admitting: *Deleted

## 2016-03-01 DIAGNOSIS — J029 Acute pharyngitis, unspecified: Secondary | ICD-10-CM | POA: Diagnosis not present

## 2016-03-01 DIAGNOSIS — R1030 Lower abdominal pain, unspecified: Secondary | ICD-10-CM | POA: Diagnosis not present

## 2016-03-01 DIAGNOSIS — R1011 Right upper quadrant pain: Secondary | ICD-10-CM

## 2016-03-01 DIAGNOSIS — H109 Unspecified conjunctivitis: Secondary | ICD-10-CM

## 2016-03-01 LAB — POCT URINALYSIS DIP (MANUAL ENTRY)
BILIRUBIN UA: NEGATIVE
BILIRUBIN UA: NEGATIVE
Glucose, UA: NEGATIVE
LEUKOCYTES UA: NEGATIVE
Nitrite, UA: NEGATIVE
Protein Ur, POC: NEGATIVE
Urobilinogen, UA: 1 (ref 0–1)
pH, UA: 5.5 (ref 5–8)

## 2016-03-01 LAB — POCT CBC W AUTO DIFF (K'VILLE URGENT CARE)

## 2016-03-01 LAB — POCT RAPID STREP A (OFFICE): Rapid Strep A Screen: NEGATIVE

## 2016-03-01 MED ORDER — GENTAMICIN SULFATE 0.3 % OP SOLN: 2.0000 [drp] | OPHTHALMIC | Status: DC

## 2016-03-01 MED ORDER — PENICILLIN G BENZATHINE 1200000 UNIT/2ML IM SUSP
1.2000 10*6.[IU] | Freq: Once | INTRAMUSCULAR | Status: AC
  Administered 2016-03-01: 1.2 10*6.[IU] via INTRAMUSCULAR

## 2016-03-01 NOTE — ED Notes (Signed)
Pt c/o lower abd pain and "movement" feeling in his lower abd, his pink eye is worse, sore throat and dysuria x 3 wks.

## 2016-03-01 NOTE — ED Provider Notes (Signed)
CSN: [id number]     Arrival date & time 03/01/16  1450 History   First MD Initiated Contact with Patient 03/01/16 1539     Chief Complaint  Patient presents with  . Abdominal Pain  . Conjunctivitis      HPI Comments: Patient presents with several complaints: 1)  One week ago he developed redness in his left eye, followed by the right eye.  He was started on Polytrim ophthalmic suspension without improvement.  His eyes have become more red with increased discharge, especially in the morning. 2)  He complains of mild dysuria for about a week without frequency, urethral discharge, hesitancy, or urgency. 3)  About 3 weeks ago he developed an intermittent vague "quivering" sensation in his lower abdomen/pelvic area.  He states that 3 months ago he delivered a calf, which required straining/pulling and he wonders if he could have developed a hernia.  The history is provided by the patient.    History reviewed. No pertinent past medical history. History reviewed. No pertinent past surgical history. History reviewed. No pertinent family history. Social History  Substance Use Topics  . Smoking status: Never Smoker   . Smokeless tobacco: None  . Alcohol Use: No    Review of Systems + sore throat No cough No pleuritic pain No wheezing No nasal congestion ? post-nasal drainage No sinus pain/pressure + itchy/red eyes ? earache No hemoptysis No SOB No fever/chills No nausea No vomiting + abdominal pain No diarrhea + dysuria No skin rash + fatigue No myalgias No headache Used OTC meds without relief  Allergies  Levofloxacin  Home Medications   Prior to Admission medications   Medication Sig Start Date End Date Taking? Authorizing Provider  fluticasone (FLONASE) 50 MCG/ACT nasal spray Place 2 sprays into both nostrils daily. For at least 2 weeks, or seasonally 02/24/16   Noland Fordyce, PA-C  omeprazole (PRILOSEC) 40 MG capsule Take one capsule by mouth 30 minutes before  evening meal 01/28/14   Kandra Nicolas, MD  Testosterone (FORTESTA) 10 MG/ACT (2%) GEL Place onto the skin.    Historical Provider, MD  trimethoprim-polymyxin b (POLYTRIM) ophthalmic solution Place 1 drop into both eyes every 4 (four) hours. For 5-7 days 02/24/16   Noland Fordyce, PA-C   Meds Ordered and Administered this Visit   Medications  penicillin g benzathine (BICILLIN LA) 1200000 UNIT/2ML injection 1.2 Million Units (not administered)    BP 150/96 mmHg  Pulse 93  Temp(Src) 97.8 F (36.6 C) (Oral)  Resp 18  Ht 5\' 11"  (1.803 m)  Wt 243 lb (110.224 kg)  BMI 33.91 kg/m2  SpO2 96% No data found.   Physical Exam  Constitutional: He is oriented to person, place, and time. He appears well-developed and well-nourished. No distress.  HENT:  Head: Normocephalic.  Right Ear: Tympanic membrane and ear canal normal.  Left Ear: Tympanic membrane and ear canal normal.  Pharynx erythematous. Tonsillar nodes enlarged and tender to palpation.  Eyes: EOM are normal. Pupils are equal, round, and reactive to light. Right eye exhibits no discharge. Left eye exhibits no discharge.  Conjunctivae are diffusely injected bilaterally without exudate.  No photophobia.  No lid swelling or tenderness.  No peri-orbital swelling.  Neck: Neck supple.  Cardiovascular: Normal heart sounds.   Pulmonary/Chest: Breath sounds normal.  Abdominal: Soft. Bowel sounds are normal. He exhibits no distension and no mass. There is tenderness.  Genitourinary: Testes normal and penis normal.    Right testis shows no tenderness. Left testis shows  no tenderness.  No indirect hernia palpated.  On the right, there is suggestion of a direct hernia on exam, as noted on diagram.  Mild pain is elicited with valsalva.    Musculoskeletal: He exhibits no edema.  Lymphadenopathy:    He has cervical adenopathy.  Neurological: He is alert and oriented to person, place, and time.  Skin: Skin is warm and dry. No rash noted.    Nursing note and vitals reviewed.   ED Course  Procedures None    Labs Reviewed  POCT URINALYSIS DIP (MANUAL ENTRY) - Abnormal; Notable for the following:    Blood, UA small (*)   SG >= 1.030     All other components within normal limits  POCT RAPID STREP A (OFFICE) negative  POCT CBC W AUTO DIFF ([facility]):  WBC 9.9; LY 21.3; MO 3.8; GR 74.9; Hgb 17.1; Platelets 214      MDM   1. Bilateral conjunctivitis   2. Suprapubic abdominal pain, unspecified laterality   3. Acute pharyngitis, unspecified etiology; ?false negative rapid strep    Schedule ultrasound lower abdomen to rule out direct inguinal hernia. Begin gentamycin ophth soln Bicillin LA IM Followup with PCP.            Kandra Nicolas, MD 03/06/16 [phone]

## 2016-03-01 NOTE — ED Notes (Signed)
appt sch'ed for abd [place] for 03/03/16 @ 1:15pm. Pt notified. Charna Archer, LPN

## 2016-03-01 NOTE — Discharge Instructions (Signed)
Increase fluid intake   Bacterial Conjunctivitis Bacterial conjunctivitis, commonly called pink eye, is an inflammation of the clear membrane that covers the white part of the eye (conjunctiva). The inflammation can also happen on the underside of the eyelids. The blood vessels in the conjunctiva become inflamed, causing the eye to become red or pink. Bacterial conjunctivitis may spread easily from one eye to another and from person to person (contagious).  CAUSES  Bacterial conjunctivitis is caused by bacteria. The bacteria may come from your own skin, your upper respiratory tract, or from someone else with bacterial conjunctivitis. SYMPTOMS  The normally white color of the eye or the underside of the eyelid is usually pink or red. The pink eye is usually associated with irritation, tearing, and some sensitivity to light. Bacterial conjunctivitis is often associated with a thick, yellowish discharge from the eye. The discharge may turn into a crust on the eyelids overnight, which causes your eyelids to stick together. If a discharge is present, there may also be some blurred vision in the affected eye. DIAGNOSIS  Bacterial conjunctivitis is diagnosed by your caregiver through an eye exam and the symptoms that you report. Your caregiver looks for changes in the surface tissues of your eyes, which may point to the specific type of conjunctivitis. A sample of any discharge may be collected on a cotton-tip swab if you have a severe case of conjunctivitis, if your cornea is affected, or if you keep getting repeat infections that do not respond to treatment. The sample will be sent to a lab to see if the inflammation is caused by a bacterial infection and to see if the infection will respond to antibiotic medicines. TREATMENT   Bacterial conjunctivitis is treated with antibiotics. Antibiotic eyedrops are most often used. However, antibiotic ointments are also available. Antibiotics pills are sometimes used.  Artificial tears or eye washes may ease discomfort. HOME CARE INSTRUCTIONS   To ease discomfort, apply a cool, clean washcloth to your eye for 10-20 minutes, 3-4 times a day.  Gently wipe away any drainage from your eye with a warm, wet washcloth or a cotton ball.  Wash your hands often with soap and water. Use paper towels to dry your hands.  Do not share towels or washcloths. This may spread the infection.  Change or wash your pillowcase every day.  You should not use eye makeup until the infection is gone.  Do not operate machinery or drive if your vision is blurred.  Stop using contact lenses. Ask your caregiver how to sterilize or replace your contacts before using them again. This depends on the type of contact lenses that you use.  When applying medicine to the infected eye, do not touch the edge of your eyelid with the eyedrop bottle or ointment tube. SEEK IMMEDIATE MEDICAL CARE IF:   Your infection has not improved within 3 days after beginning treatment.  You had yellow discharge from your eye and it returns.  You have increased eye pain.  Your eye redness is spreading.  Your vision becomes blurred.  You have a fever or persistent symptoms for more than 2-3 days.  You have a fever and your symptoms suddenly get worse.  You have facial pain, redness, or swelling. MAKE SURE YOU:   Understand these instructions.  Will watch your condition.  Will get help right away if you are not doing well or get worse.   This information is not intended to replace advice given to you by your health  care provider. Make sure you discuss any questions you have with your health care provider.   Document Released: 11/27/2005 Document Revised: 12/18/2014 Document Reviewed: 04/29/2012 Elsevier Interactive Patient Education [employer].

## 2016-03-02 LAB — STREP A DNA PROBE: GASP: NOT DETECTED

## 2016-03-03 ENCOUNTER — Telehealth: Payer: Self-pay | Admitting: *Deleted

## 2016-03-03 ENCOUNTER — Other Ambulatory Visit: Payer: BLUE CROSS/BLUE SHIELD

## 2016-03-03 ENCOUNTER — Ambulatory Visit (INDEPENDENT_AMBULATORY_CARE_PROVIDER_SITE_OTHER): Payer: BLUE CROSS/BLUE SHIELD

## 2016-03-03 DIAGNOSIS — R1030 Lower abdominal pain, unspecified: Secondary | ICD-10-CM | POA: Diagnosis not present

## 2016-03-03 LAB — URINE CULTURE
COLONY COUNT: NO GROWTH
Organism ID, Bacteria: NO GROWTH

## 2016-03-04 ENCOUNTER — Telehealth: Payer: Self-pay

## 2017-07-19 ENCOUNTER — Other Ambulatory Visit: Payer: Self-pay | Admitting: Family Medicine

## 2017-07-19 ENCOUNTER — Emergency Department (INDEPENDENT_AMBULATORY_CARE_PROVIDER_SITE_OTHER)
Admission: EM | Admit: 2017-07-19 | Discharge: 2017-07-19 | Disposition: A | Discharge status: Home / Self Care | Payer: BLUE CROSS/BLUE SHIELD | Source: Home / Self Care | Attending: Family Medicine | Admitting: Family Medicine

## 2017-07-19 DIAGNOSIS — W57XXXA Bitten or stung by nonvenomous insect and other nonvenomous arthropods, initial encounter: Secondary | ICD-10-CM

## 2017-07-19 DIAGNOSIS — R5383 Other fatigue: Secondary | ICD-10-CM | POA: Diagnosis not present

## 2017-07-19 LAB — POCT CBC W AUTO DIFF (K'VILLE URGENT CARE)

## 2017-07-19 MED ORDER — DOXYCYCLINE HYCLATE 100 MG PO CAPS: 100.0000 mg | ORAL_CAPSULE | Freq: Two times a day (BID) | ORAL | 0 refills | Status: DC

## 2017-07-19 NOTE — ED Triage Notes (Signed)
Patient c/o receiving multiple tick bites 5 days ago while clearing out woods. He c/o 3-4 days of fatigue, HA, weakness, head and chest pressure with SOB at times.

## 2017-07-19 NOTE — Discharge Instructions (Signed)
Wear sun protection during sun exposure while taking doxycycline.

## 2017-07-19 NOTE — ED Provider Notes (Signed)
[name]    CSN: [id number] Arrival date & time: 07/19/17  1618     History   Chief Complaint Chief Complaint  Patient presents with  . Tick Bites  . Fatigue  . Headache  . Chest Pain    HPI Jordan Berg is a 61 y.o. male.   Patient complains of receiving multiple tick bites five days ago while clearing out woods.   During the past 3 to 4 days he has felt fatigued and developed headache and occasional shortness of breath.  He denies rash.  No fevers, chills, and sweats.     The history is provided by the patient.    No past medical history on file.  Patient Active Problem List   Diagnosis Date Noted  . CHOLELITHIASIS 12/28/2008  . ABDOMINAL PAIN-RUQ 12/25/2008  . ABDOMINAL PAIN 12/24/2008    No past surgical history on file.     Home Medications    Prior to Admission medications   Medication Sig Start Date End Date Taking? Authorizing Provider  doxycycline (VIBRAMYCIN) 100 MG capsule Take 1 capsule (100 mg total) by mouth 2 (two) times daily. Take with food. 07/19/17   Kandra Nicolas, MD  fluticasone (FLONASE) 50 MCG/ACT nasal spray Place 2 sprays into both nostrils daily. For at least 2 weeks, or seasonally 02/24/16   Noe Gens, PA-C  omeprazole (PRILOSEC) 40 MG capsule Take one capsule by mouth 30 minutes before evening meal 01/28/14   Kandra Nicolas, MD  Testosterone (FORTESTA) 10 MG/ACT (2%) GEL Place onto the skin.    [provider]    Family History No family history on file.  Social History Social History  Substance Use Topics  . Smoking status: Never Smoker  . Smokeless tobacco: Not on file  . Alcohol use No     Allergies   Levofloxacin   Review of Systems Review of Systems  No sore throat No cough No pleuritic pain, but has tightness in anterior chest No wheezing No nasal congestion No post-nasal drainage No sinus pain/pressure No itchy/red eyes No earache No hemoptysis + SOB No fever/chills +  nausea No vomiting No abdominal pain No diarrhea No urinary symptoms No skin rash + fatigue No myalgias + headache Used OTC meds without relief    Physical Exam Triage Vital Signs ED Triage Vitals [07/19/17 1650]  Enc Vitals Group     BP (!) 151/99     Pulse Rate 78     Resp      Temp 97.8 F (36.6 C)     Temp Source Oral     SpO2 96 %     Weight 254 lb (115.2 kg)     Height      Head Circumference      Peak Flow      Pain Score 3     Pain Loc      Pain Edu?      Excl. in [place]?    No data found.   Updated Vital Signs BP (!) 151/99 (BP Location: Left Arm)   Pulse 78   Temp 97.8 F (36.6 C) (Oral)   Wt 254 lb (115.2 kg)   SpO2 96%   BMI 35.43 kg/m   Visual Acuity Right Eye Distance:   Left Eye Distance:   Bilateral Distance:    Right Eye Near:   Left Eye Near:    Bilateral Near:     Physical Exam Nursing notes and Vital Signs reviewed.  Appearance:  Patient appears stated age, and in no acute distress Eyes:  Pupils are equal, round, and reactive to light and accomodation.  Extraocular movement is intact.  Conjunctivae are not inflamed  Ears:  Canals normal.  Tympanic membranes normal.  Nose:  Mildly congested turbinates.  No sinus tenderness.   Pharynx:  Normal Neck:  Supple.  No adenopathy.   Lungs:  Clear to auscultation.  Breath sounds are equal.  Moving air well. Heart:  Regular rate and rhythm without murmurs, rubs, or gallops.  Abdomen:  Nontender without masses or hepatosplenomegaly.  Bowel sounds are present.  No CVA or flank tenderness.  Extremities:  No edema.  Skin:  No rash present.  No obvious tick bites present.  UC Treatments / Results  Labs (all labs ordered are listed, but only abnormal results are displayed) Labs Reviewed  B. BURGDORFI ANTIBODIES  ROCKY MTN SPOTTED FVR ABS PNL(IGG+IGM)  POCT CBC W AUTO DIFF ([facility]):  WBC 7.1; LY 32.5; MO 8.7; GR 58.8; Hgb 16.1; Platelets 165     EKG  EKG Interpretation None         Radiology No results found.  Procedures Procedures (including critical care time)  Medications Ordered in UC Medications - No data to display   Initial Impression / Assessment and Plan / UC Course  I have reviewed the triage vital signs and the nursing notes.  Pertinent labs & imaging results that were available during my care of the patient were reviewed by me and considered in my medical decision making (see chart for details).    RMSF and Lyme Disease titers pending. Begin empiric doxycycline. Wear sun protection during sun exposure while taking doxycycline. Followup with Family Doctor if not improved in 10 days.    Final Clinical Impressions(s) / UC Diagnoses   Final diagnoses:  Fatigue, unspecified type  Tick bite, initial encounter    New Prescriptions New Prescriptions   DOXYCYCLINE (VIBRAMYCIN) 100 MG CAPSULE    Take 1 capsule (100 mg total) by mouth 2 (two) times daily. Take with food.         Kandra Nicolas, MD 07/30/17 1410

## 2017-07-20 LAB — ROCKY MTN SPOTTED FVR ABS PNL(IGG+IGM)
RMSF IGG: NOT DETECTED
RMSF IGM: NOT DETECTED

## 2017-07-20 LAB — LYME AB/WESTERN BLOT REFLEX: B burgdorferi Ab IgG+IgM: <0.9 Index (ref <0.90)

## 2017-07-23 ENCOUNTER — Telehealth: Payer: Self-pay | Admitting: *Deleted

## 2017-07-23 NOTE — Telephone Encounter (Signed)
Spoke to pt given lab results.  

## 2017-12-11 DIAGNOSIS — E7800 Pure hypercholesterolemia, unspecified: Secondary | ICD-10-CM | POA: Insufficient documentation

## 2017-12-11 HISTORY — DX: Pure hypercholesterolemia, unspecified: E78.00

## 2017-12-13 ENCOUNTER — Ambulatory Visit: Payer: Self-pay

## 2017-12-13 NOTE — Telephone Encounter (Signed)
FYI

## 2017-12-13 NOTE — Telephone Encounter (Signed)
Patient called in with c/o "abdominal pain." He described the location of the pain as "from my waist on the right side half way up to my arm pit. It's a round spot there and it hurts the most after I eat, but then calms down once the food is digested. I had my gallbladder out and it's in the same area as where my gallbladder would be." He reports over 4-6 weeks, the pain has gradually gotten worse, not better, but not severe pain. He says the pain is "4 when I'm not eating all the time, but increases higher when I eat." He also reports after he eats having 3-4 soft BM's a day x 3-4 days, which is not his normal, then it goes back to his normal pattern. He denies the BM's are diarrhea or constipated. The patient does not have a PCP and says he wants to go to [place], if possible.  He denies having yellow sclera, abdominal swelling, constipation, nausea, vomiting. A new established patient appointment is made with Dr. Anitra Lauth for 12/14/17 and this is within the protocol to see a PCP in 24 hours. Care advice given, patient verbalized understanding.   Reason for Disposition . Age > 60 years  Answer Assessment - Initial Assessment Questions 1. LOCATION: "Where does it hurt?"      Rt. Side from waist to arm pit 1/2 way up to one round spot 2. RADIATION: "Does the pain shoot anywhere else?" (e.g., chest, back)     Denies 3. ONSET: "When did the pain begin?" (Minutes, hours or days ago)      4-6 weeks ago 4. SUDDEN: "Gradual or sudden onset?"     Gradual 5. PATTERN "Does the pain come and go, or is it constant?"    - If constant: "Is it getting better, staying the same, or worsening?"      (Note: Constant means the pain never goes away completely; most serious pain is constant and it progresses)     - If intermittent: "How long does it last?" "Do you have pain now?"     (Note: Intermittent means the pain goes away completely between bouts)     Worse after eat, constant low grade all the time 6. SEVERITY:  "How bad is the pain?"  (e.g., Scale 1-10; mild, moderate, or severe)    - MILD (1-3): doesn't interfere with normal activities, abdomen soft and not tender to touch     - MODERATE (4-7): interferes with normal activities or awakens from sleep, tender to touch     - SEVERE (8-10): excruciating pain, doubled over, unable to do any normal activities       4 7. RECURRENT SYMPTOM: "Have you ever had this type of abdominal pain before?" If so, ask: "When was the last time?" and "What happened that time?"      Not like this. When I had my gallbladder, the pain was similar to that side. 8. CAUSE: "What do you think is causing the abdominal pain?"     I have no idea 9. RELIEVING/AGGRAVATING FACTORS: "What makes it better or worse?" (e.g., movement, antacids, bowel movement)     Eating makes it worse. Better-not eating 10. OTHER SYMPTOMS: "Has there been any vomiting, diarrhea, constipation, or urine problems?"       BM's 3-4 times a day, soft for 3-4 days, then back to normal  Protocols used: ABDOMINAL PAIN - MALE-A-AH

## 2017-12-13 NOTE — Telephone Encounter (Signed)
Noted  

## 2017-12-14 ENCOUNTER — Ambulatory Visit (INDEPENDENT_AMBULATORY_CARE_PROVIDER_SITE_OTHER): Payer: BLUE CROSS/BLUE SHIELD | Admitting: Family Medicine

## 2017-12-14 ENCOUNTER — Encounter: Payer: Self-pay | Admitting: Family Medicine

## 2017-12-14 ENCOUNTER — Other Ambulatory Visit: Payer: Self-pay | Admitting: Family Medicine

## 2017-12-14 ENCOUNTER — Ambulatory Visit (HOSPITAL_BASED_OUTPATIENT_CLINIC_OR_DEPARTMENT_OTHER)
Admission: RE | Admit: 2017-12-14 | Discharge: 2017-12-14 | Disposition: A | Discharge status: Home / Self Care | Payer: BLUE CROSS/BLUE SHIELD | Source: Ambulatory Visit | Attending: Family Medicine | Admitting: Family Medicine

## 2017-12-14 VITALS — BP 135/85 | HR 81 | Temp 98.5°F | Resp 16 | Ht 71.5 in | Wt 256.5 lb

## 2017-12-14 DIAGNOSIS — R1011 Right upper quadrant pain: Secondary | ICD-10-CM | POA: Diagnosis not present

## 2017-12-14 DIAGNOSIS — Z9049 Acquired absence of other specified parts of digestive tract: Secondary | ICD-10-CM | POA: Diagnosis present

## 2017-12-14 LAB — CBC WITH DIFFERENTIAL/PLATELET
Basophils Absolute: 0.1 10*3/uL (ref 0.0–0.1)
Basophils Relative: 1.2 % (ref 0.0–3.0)
Eosinophils Absolute: 0.4 10*3/uL (ref 0.0–0.7)
Eosinophils Relative: 5.4 % — ABNORMAL HIGH (ref 0.0–5.0)
HCT: 51.4 % (ref 39.0–52.0)
Hemoglobin: 17.2 g/dL — ABNORMAL HIGH (ref 13.0–17.0)
LYMPHS ABS: 1.6 10*3/uL (ref 0.7–4.0)
Lymphocytes Relative: 24.2 % (ref 12.0–46.0)
MCHC: 33.5 g/dL (ref 30.0–36.0)
MCV: 95.7 fl (ref 78.0–100.0)
MONO ABS: 0.7 10*3/uL (ref 0.1–1.0)
MONOS PCT: 10.5 % (ref 3.0–12.0)
NEUTROS ABS: 4 10*3/uL (ref 1.4–7.7)
NEUTROS PCT: 58.7 % (ref 43.0–77.0)
PLATELETS: 193 10*3/uL (ref 150.0–400.0)
RBC: 5.37 Mil/uL (ref 4.22–5.81)
RDW: 13.7 % (ref 11.5–15.5)
WBC: 6.7 10*3/uL (ref 4.0–10.5)

## 2017-12-14 LAB — COMPREHENSIVE METABOLIC PANEL
ALBUMIN: 4.5 g/dL (ref 3.5–5.2)
ALK PHOS: 47 U/L (ref 39–117)
ALT: 29 U/L (ref 0–53)
AST: 21 U/L (ref 0–37)
BUN: 16 mg/dL (ref 6–23)
CO2: 30 mEq/L (ref 19–32)
Calcium: 9.5 mg/dL (ref 8.4–10.5)
Chloride: 100 mEq/L (ref 96–112)
Creatinine, Ser: 1.09 mg/dL (ref 0.40–1.50)
GFR: 72.61 mL/min (ref >60.00)
Glucose, Bld: 76 mg/dL (ref 70–99)
POTASSIUM: 4.1 meq/L (ref 3.5–5.1)
Sodium: 138 mEq/L (ref 135–145)
TOTAL PROTEIN: 7 g/dL (ref 6.0–8.3)
Total Bilirubin: 0.9 mg/dL (ref 0.2–1.2)

## 2017-12-14 LAB — POCT URINALYSIS DIPSTICK
Bilirubin, UA: NEGATIVE
GLUCOSE UA: NEGATIVE
KETONES UA: NEGATIVE
Leukocytes, UA: NEGATIVE
Nitrite, UA: NEGATIVE
Protein, UA: NEGATIVE
SPEC GRAV UA: 1.025 (ref 1.010–1.025)
Urobilinogen, UA: 0.2 E.U./dL
pH, UA: 6 (ref 5.0–8.0)

## 2017-12-14 LAB — GAMMA GT: GGT: 42 U/L (ref 7–51)

## 2017-12-14 LAB — SEDIMENTATION RATE: SED RATE: 8 mm/h (ref 0–20)

## 2017-12-14 LAB — C-REACTIVE PROTEIN: CRP: 0.3 mg/dL — AB (ref 0.5–20.0)

## 2017-12-14 LAB — LIPASE: LIPASE: 28 U/L (ref 11.0–59.0)

## 2017-12-14 NOTE — Progress Notes (Signed)
Office Note 12/14/2017  CC:  Chief Complaint  Patient presents with  . Establish Care  . Abdominal Pain    Right side x 4-6 weeks    HPI:  Jordan Berg is a 62 y.o. male who is here to establish care and discuss acute problem of abdominal pain. Patient's most recent primary MD: none Old records EPIC/HL EMR were reviewed prior to or during today's visit.  Onset about 6 weeks ago, started having RUQ discomfort, worse after eating, describes a focal RUQ burning pressure that occasionally shoots down R middle abd.  No nausea or vomiting.  He sometimes notes RUQ distention. Consistently gets worse when he eats, but the sensation is present all the time at mild intensity. Any type of food makes it worse.  Sometimes quantity makes it worse (larger meal=worse).   Denies GERD.  Has 4-5 BMs per day during this time but they are formed and often his abd distention improves after this. No meds tried for these sx's.  No significant use of NSAIDs.  Past Medical History:  Diagnosis Date  . Colon polyps 01/20/2009   NON-adenomatous-recall 10 yrs  . Hypogonadism male     Past Surgical History:  Procedure Laterality Date  . BACK SURGERY  2000  . CHOLECYSTECTOMY  2010  . COLONOSCOPY W/ POLYPECTOMY  01/20/2009   Multiple nonadenomatous polyps.  Recall 10 yrs.  Marland Kitchen KNEE SURGERY Left 2000 and 2017  . NASAL SEPTUM SURGERY  1981  . TONSILLECTOMY  1978    Family History  Problem Relation Age of Onset  . Arthritis Mother   . Depression Mother   . Alzheimer's disease Mother   . Alcohol abuse Father   . Arthritis Father   . COPD Father   . Hearing loss Father   . Heart disease Father   . Colon cancer Sister     Social History   Socioeconomic History  . Marital status: Married    Spouse name: Not on file  . Number of children: Not on file  . Years of education: Not on file  . Highest education level: Not on file  Social Needs  . Financial resource strain: Not on file  . Food  insecurity - worry: Not on file  . Food insecurity - inability: Not on file  . Transportation needs - medical: Not on file  . Transportation needs - non-medical: Not on file  Occupational History  . Not on file  Tobacco Use  . Smoking status: Former Smoker    Packs/day: 1.50    Years: 3.00    Pack years: 4.50    Types: Cigarettes    Last attempt to quit: 12/11/1976    Years since quitting: 41.0  . Smokeless tobacco: Never Used  Substance and Sexual Activity  . Alcohol use: No  . Drug use: No  . Sexual activity: Not on file  Other Topics Concern  . Not on file  Social History Narrative  . Not on file    Outpatient Encounter Medications as of 12/14/2017  Medication Sig  . B-D 3CC LUER-LOK SYR 18GX1-1/2 18G X 1-1/2" 3 ML MISC UUD FOR TESTOSTERONE  . clobetasol cream (TEMOVATE) 1.61 % 1 APPLICATION TO AFFECTED AREA TWICE A DAY EXTERNALLY 14  . PAZEO 0.7 % SOLN Place 1 drop into both eyes daily.  Marland Kitchen testosterone cypionate (DEPOTESTOSTERONE CYPIONATE) 200 MG/ML injection Inject 0.4 mLs into the muscle once a week.  . [DISCONTINUED] doxycycline (VIBRAMYCIN) 100 MG capsule Take 1 capsule (100 mg  total) by mouth 2 (two) times daily. Take with food.  . [DISCONTINUED] fluticasone (FLONASE) 50 MCG/ACT nasal spray Place 2 sprays into both nostrils daily. For at least 2 weeks, or seasonally  . [DISCONTINUED] omeprazole (PRILOSEC) 40 MG capsule Take one capsule by mouth 30 minutes before evening meal  . [DISCONTINUED] Testosterone (FORTESTA) 10 MG/ACT (2%) GEL Place onto the skin.   No facility-administered encounter medications on file as of 12/14/2017.     Allergies  Allergen Reactions  . Levofloxacin     REACTION: whelps itching    ROS Review of Systems  Constitutional: Negative for appetite change, chills, fatigue, fever and unexpected weight change.  HENT: Negative for congestion, dental problem, ear pain and sore throat.   Eyes: Negative for discharge, redness and visual disturbance.   Respiratory: Negative for cough, chest tightness, shortness of breath and wheezing.   Cardiovascular: Negative for chest pain, palpitations and leg swelling.  Gastrointestinal: Positive for abdominal pain (see hpi). Negative for blood in stool, diarrhea, nausea and vomiting.  Genitourinary: Negative for difficulty urinating, dysuria, flank pain, frequency, hematuria and urgency.  Musculoskeletal: Negative for arthralgias, back pain, joint swelling, myalgias and neck stiffness.  Skin: Negative for pallor and rash.  Neurological: Negative for dizziness, speech difficulty, weakness and headaches.  Hematological: Negative for adenopathy. Does not bruise/bleed easily.  Psychiatric/Behavioral: Negative for confusion and sleep disturbance. The patient is not nervous/anxious.     PE; Blood pressure 135/85, pulse 81, temperature 98.5 F (36.9 C), temperature source Oral, resp. rate 16, height 5' 11.5" (1.816 m), weight 256 lb 8 oz (116.3 kg), SpO2 94 %. Gen: Alert, well appearing.  Patient is oriented to person, place, time, and situation. AFFECT: pleasant, lucid thought and speech. WNI:OEVO: no injection, icteris, swelling, or exudate.  EOMI, PERRLA. Mouth: lips without lesion/swelling.  Oral mucosa pink and moist. Oropharynx without erythema, exudate, or swelling.  Neck - No masses or thyromegaly or limitation in range of motion CV: RRR, no m/r/g.   LUNGS: CTA bilat, nonlabored resps, good aeration in all lung fields. ABD: soft, rotund, ND, BS normal.  No hepatospenomegaly or mass.  No bruits. Focal RUQ TTP in anterior axillary line just inferior to the ribs.  No guarding or rebound. EXT: no clubbing, cyanosis, or edema.  Skin - no sores or suspicious lesions or rashes or color changes Neuro: CN 2-12 intact bilaterally, strength 5/5 in proximal and distal upper extremities and lower extremities bilaterally.   No tremor.  No ataxia.  Marland Kitchen  No pronator drift.   Pertinent labs:  No results found  for: TSH Lab Results  Component Value Date   WBC 7.5 12/25/2008   HGB 15.8 03/09/2009   HCT 46.6 03/09/2009   MCV 92.3 12/25/2008   PLT 198 12/25/2008   Lab Results  Component Value Date   CREATININE 1.25 02/05/2016   BUN 16 02/05/2016   NA 137 02/05/2016   K 4.4 02/05/2016   CL 101 02/05/2016   CO2 23 02/05/2016   Lab Results  Component Value Date   ALT 32 02/05/2016   AST 28 02/05/2016   ALKPHOS 39 (L) 02/05/2016   BILITOT 0.9 02/05/2016    CC UA today: trace lysed blood, o/w normal.  ASSESSMENT AND PLAN:   New pt: no prior PCP records available.  1) Subacute RUQ pain, mild low intensity all the time, made much worse with eating. R/o biliary tract abnormality: stone vs mass vs bile duct stricture.  ?Sphincter of oddi dysfunction much less  likely.  Abd wall pain?--dx of exclusion at this point in time. Start with checking CBC, CMET, lipase, and UA. UA normal here today. Check acute abdominal series. Next step if all of this is unrevealing is MRCP vs GI referral.  An After Visit Summary was printed and given to the patient.  F/u 2 weeks for recheck abd pain + get CPE.  Signed:  Crissie Sickles, MD           12/14/2017

## 2017-12-17 ENCOUNTER — Encounter: Payer: Self-pay | Admitting: *Deleted

## 2017-12-24 ENCOUNTER — Other Ambulatory Visit: Payer: Self-pay | Admitting: Family Medicine

## 2017-12-24 ENCOUNTER — Encounter: Payer: Self-pay | Admitting: Family Medicine

## 2017-12-24 ENCOUNTER — Ambulatory Visit (INDEPENDENT_AMBULATORY_CARE_PROVIDER_SITE_OTHER): Payer: BLUE CROSS/BLUE SHIELD

## 2017-12-24 DIAGNOSIS — K760 Fatty (change of) liver, not elsewhere classified: Secondary | ICD-10-CM

## 2017-12-24 DIAGNOSIS — R1011 Right upper quadrant pain: Secondary | ICD-10-CM

## 2017-12-24 DIAGNOSIS — Z9049 Acquired absence of other specified parts of digestive tract: Secondary | ICD-10-CM

## 2017-12-24 MED ORDER — GADOBENATE DIMEGLUMINE 529 MG/ML IV SOLN
20.0000 mL | Freq: Once | INTRAVENOUS | Status: AC | PRN
  Administered 2017-12-24: 20 mL via INTRAVENOUS

## 2017-12-25 ENCOUNTER — Encounter: Payer: Self-pay | Admitting: Gastroenterology

## 2017-12-27 ENCOUNTER — Ambulatory Visit (INDEPENDENT_AMBULATORY_CARE_PROVIDER_SITE_OTHER): Payer: BLUE CROSS/BLUE SHIELD | Admitting: Family Medicine

## 2017-12-27 ENCOUNTER — Encounter: Payer: Self-pay | Admitting: Family Medicine

## 2017-12-27 VITALS — BP 125/81 | HR 79 | Temp 98.6°F | Resp 16 | Ht 71.5 in | Wt 255.5 lb

## 2017-12-27 DIAGNOSIS — Z0000 Encounter for general adult medical examination without abnormal findings: Secondary | ICD-10-CM

## 2017-12-27 DIAGNOSIS — Z9189 Other specified personal risk factors, not elsewhere classified: Secondary | ICD-10-CM | POA: Diagnosis not present

## 2017-12-27 DIAGNOSIS — R1011 Right upper quadrant pain: Secondary | ICD-10-CM

## 2017-12-27 DIAGNOSIS — E66812 Obesity, class 2: Secondary | ICD-10-CM

## 2017-12-27 DIAGNOSIS — Z1159 Encounter for screening for other viral diseases: Secondary | ICD-10-CM | POA: Diagnosis not present

## 2017-12-27 DIAGNOSIS — Z23 Encounter for immunization: Secondary | ICD-10-CM

## 2017-12-27 DIAGNOSIS — Z114 Encounter for screening for human immunodeficiency virus [HIV]: Secondary | ICD-10-CM | POA: Diagnosis not present

## 2017-12-27 DIAGNOSIS — E669 Obesity, unspecified: Secondary | ICD-10-CM

## 2017-12-27 LAB — LIPID PANEL
CHOL/HDL RATIO: 5
Cholesterol: 168 mg/dL (ref 0–200)
HDL: 36.8 mg/dL — ABNORMAL LOW (ref >39.00)
LDL CALC: 112 mg/dL — AB (ref 0–99)
NonHDL: 131.61
TRIGLYCERIDES: 96 mg/dL (ref 0.0–149.0)
VLDL: 19.2 mg/dL (ref 0.0–40.0)

## 2017-12-27 NOTE — Patient Instructions (Signed)
Please drop off your record of flu and shingrix vaccines that were done through [facility], [facility] A healthy lifestyle and preventive care is important for your health and wellness. Ask your health care provider about what schedule of regular examinations is right for you. What should I know about weight and diet? Eat a Healthy Diet  Eat plenty of vegetables, fruits, whole grains, low-fat dairy products, and lean protein.  Do not eat a lot of foods high in solid fats, added sugars, or salt.  Maintain a Healthy Weight Regular exercise can help you achieve or maintain a healthy weight. You should:  Do at least 150 minutes of exercise each week. The exercise should increase your heart rate and make you sweat (moderate-intensity exercise).  Do strength-training exercises at least twice a week.  Watch Your Levels of Cholesterol and Blood Lipids  Have your blood tested for lipids and cholesterol every 5 years starting at 64 years of age. If you are at high risk for heart disease, you should start having your blood tested when you are 64 years old. You may need to have your cholesterol levels checked more often if: ? Your lipid or cholesterol levels are high. ? You are older than 64 years of age. ? You are at high risk for heart disease.  What should I know about cancer screening? Many types of cancers can be detected early and may often be prevented. Lung Cancer  You should be screened every year for lung cancer if: ? You are a current smoker who has smoked for at least 30 years. ? You are a former smoker who has quit within the past 15 years.  Talk to your health care provider about your screening options, when you should start screening, and how often you should be screened.  Colorectal Cancer  Routine colorectal cancer screening usually begins at 64 years of age and should be repeated every 5-10 years until you are 64 years old. You may need to be screened more  often if early forms of precancerous polyps or small growths are found. Your health care provider may recommend screening at an earlier age if you have risk factors for colon cancer.  Your health care provider may recommend using home test kits to check for hidden blood in the stool.  A small camera at the end of a tube can be used to examine your colon (sigmoidoscopy or colonoscopy). This checks for the earliest forms of colorectal cancer.  Prostate and Testicular Cancer  Depending on your age and overall health, your health care provider may do certain tests to screen for prostate and testicular cancer.  Talk to your health care provider about any symptoms or concerns you have about testicular or prostate cancer.  Skin Cancer  Check your skin from head to toe regularly.  Tell your health care provider about any new moles or changes in moles, especially if: ? There is a change in a mole's size, shape, or color. ? You have a mole that is larger than a pencil eraser.  Always use sunscreen. Apply sunscreen liberally and repeat throughout the day.  Protect yourself by wearing long sleeves, pants, a wide-brimmed hat, and sunglasses when outside.  What should I know about heart disease, diabetes, and high blood pressure?  If you are 36-31 years of age, have your blood pressure checked every 3-5 years. If you are 69 years of age or older, have your blood pressure checked every year. You should  have your blood pressure measured twice-once when you are at a hospital or clinic, and once when you are not at a hospital or clinic. Record the average of the two measurements. To check your blood pressure when you are not at a hospital or clinic, you can use: ? An automated blood pressure machine at a pharmacy. ? A home blood pressure monitor.  Talk to your health care provider about your target blood pressure.  If you are between 74-45 years old, ask your health care provider if you should take  aspirin to prevent heart disease.  Have regular diabetes screenings by checking your fasting blood sugar level. ? If you are at a normal weight and have a low risk for diabetes, have this test once every three years after the age of 24. ? If you are overweight and have a high risk for diabetes, consider being tested at a younger age or more often.  A one-time screening for abdominal aortic aneurysm (AAA) by ultrasound is recommended for men aged 9-75 years who are current or former smokers. What should I know about preventing infection? Hepatitis B If you have a higher risk for hepatitis B, you should be screened for this virus. Talk with your health care provider to find out if you are at risk for hepatitis B infection. Hepatitis C Blood testing is recommended for:  Everyone born from [birth year] through 1967.  Anyone with known risk factors for hepatitis C.  Sexually Transmitted Diseases (STDs)  You should be screened each year for STDs including gonorrhea and chlamydia if: ? You are sexually active and are younger than 64 years of age. ? You are older than 64 years of age and your health care provider tells you that you are at risk for this type of infection. ? Your sexual activity has changed since you were last screened and you are at an increased risk for chlamydia or gonorrhea. Ask your health care provider if you are at risk.  Talk with your health care provider about whether you are at high risk of being infected with HIV. Your health care provider may recommend a prescription medicine to help prevent HIV infection.  What else can I do?  Schedule regular health, dental, and eye exams.  Stay current with your vaccines (immunizations).  Do not use any tobacco products, such as cigarettes, chewing tobacco, and e-cigarettes. If you need help quitting, ask your health care provider.  Limit alcohol intake to no more than 2 drinks per day. One drink equals 12 ounces of beer, 5 ounces of  wine, or 1 ounces of hard liquor.  Do not use street drugs.  Do not share needles.  Ask your health care provider for help if you need support or information about quitting drugs.  Tell your health care provider if you often feel depressed.  Tell your health care provider if you have ever been abused or do not feel safe at home. This information is not intended to replace advice given to you by your health care provider. Make sure you discuss any questions you have with your health care provider. Document Released: 05/25/2008 Document Revised: 07/26/2016 Document Reviewed: 08/31/2015 Elsevier Interactive Patient Education  [employer].

## 2017-12-27 NOTE — Progress Notes (Signed)
Office Note 12/27/2017  CC:  Chief Complaint  Patient presents with  . Annual Exam    Pt is fasting.   . Follow-up    Abdominal Pain    HPI:  Jordan Berg is a 62 y.o. White male who is 2 week f/u abd pain plus annual health maintenance exam. Recent w/u for RUQ pain has been unrevealing: see PMH section below. I have referred him to GI as the next step. Update: had a great day 2 d/a---no pain at all.  Then yesterday he ate a large shrimp supper and pain returned but mild in intensity.  NO associated sx's.  No pain today so far.  Gets hypogonadism managed through his urologist.  Exercise: none. Diet: not working on anything in particular.   Past Medical History:  Diagnosis Date  . Colon polyps 01/20/2009   NON-adenomatous-recall 10 yrs  . Hepatic steatosis    Noted on MRI abd 12/2017 (done for RUQ pain)  . Hypogonadism male   . RUQ abdominal pain Dec 2018;Jan 2019   Labs nl, abd plain films nl, MRCP nl.    Past Surgical History:  Procedure Laterality Date  . CHOLECYSTECTOMY  2010  . COLONOSCOPY W/ POLYPECTOMY  01/20/2009   Multiple nonadenomatous polyps.  Recall 10 yrs.  Marland Kitchen KNEE SURGERY Left 2000 and 2017   arthroscopic  . LUMBAR SPINE SURGERY  2000   No hardware  . NASAL SEPTUM SURGERY  1981  . TONSILLECTOMY  1978    Family History  Problem Relation Age of Onset  . Arthritis Mother   . Depression Mother   . Alzheimer's disease Mother   . Alcohol abuse Father   . Arthritis Father   . COPD Father   . Hearing loss Father   . Heart disease Father   . Colon cancer Sister     Social History   Socioeconomic History  . Marital status: Married    Spouse name: Not on file  . Number of children: Not on file  . Years of education: Not on file  . Highest education level: Not on file  Social Needs  . Financial resource strain: Not on file  . Food insecurity - worry: Not on file  . Food insecurity - inability: Not on file  . Transportation needs - medical:  Not on file  . Transportation needs - non-medical: Not on file  Occupational History  . Not on file  Tobacco Use  . Smoking status: Former Smoker    Packs/day: 1.50    Years: 3.00    Pack years: 4.50    Types: Cigarettes    Last attempt to quit: 12/11/1976    Years since quitting: 41.0  . Smokeless tobacco: Never Used  Substance and Sexual Activity  . Alcohol use: No  . Drug use: No  . Sexual activity: Not on file  Other Topics Concern  . Not on file  Social History Narrative  . Not on file    Outpatient Medications Prior to Visit  Medication Sig Dispense Refill  . B-D 3CC LUER-LOK SYR 18GX1-1/2 18G X 1-1/2" 3 ML MISC UUD FOR TESTOSTERONE  6  . clobetasol cream (TEMOVATE) 2.44 % 1 APPLICATION TO AFFECTED AREA TWICE A DAY EXTERNALLY 14  0  . PAZEO 0.7 % SOLN Place 1 drop into both eyes daily.    . Salicylic Acid 3 % OINT Apply 1 application topically daily as needed.    . testosterone cypionate (DEPOTESTOSTERONE CYPIONATE) 200 MG/ML injection Inject 0.4  mLs into the muscle once a week.  1   No facility-administered medications prior to visit.     Allergies  Allergen Reactions  . Levofloxacin     REACTION: whelps itching    ROS Review of Systems  Constitutional: Negative for appetite change, chills, fatigue and fever.  HENT: Negative for congestion, dental problem, ear pain and sore throat.   Eyes: Negative for discharge, redness and visual disturbance.  Respiratory: Negative for cough, chest tightness, shortness of breath and wheezing.   Cardiovascular: Negative for chest pain, palpitations and leg swelling.  Gastrointestinal: Positive for abdominal pain (see HPI and note done on 12/14/17 for details). Negative for blood in stool, diarrhea, nausea and vomiting.  Genitourinary: Negative for difficulty urinating, dysuria, flank pain, frequency, hematuria and urgency.  Musculoskeletal: Positive for neck pain (he has neurosurgeon who he wants to see about this.). Negative for  arthralgias, back pain, joint swelling, myalgias and neck stiffness.  Skin: Negative for pallor and rash.  Neurological: Negative for dizziness, speech difficulty, weakness and headaches.  Hematological: Negative for adenopathy. Does not bruise/bleed easily.  Psychiatric/Behavioral: Negative for confusion and sleep disturbance. The patient is not nervous/anxious.    PE; Blood pressure 125/81, pulse 79, temperature 98.6 F (37 C), temperature source Oral, resp. rate 16, height 5' 11.5" (1.816 m), weight 255 lb 8 oz (115.9 kg), SpO2 95 %. Body mass index is 35.14 kg/m.  Gen: Alert, well appearing.  Patient is oriented to person, place, time, and situation. AFFECT: pleasant, lucid thought and speech. ENT: Ears: EACs clear, normal epithelium.  TMs with good light reflex and landmarks bilaterally.  Eyes: no injection, icteris, swelling, or exudate.  EOMI, PERRLA. Nose: no drainage or turbinate edema/swelling.  No injection or focal lesion.  Mouth: lips without lesion/swelling.  Oral mucosa pink and moist.  Dentition intact and without obvious caries or gingival swelling.  Oropharynx without erythema, exudate, or swelling.  Neck: supple/nontender.  No LAD, mass, or TM.  Carotid pulses 2+ bilaterally, without bruits. CV: RRR, no m/r/g.   LUNGS: CTA bilat, nonlabored resps, good aeration in all lung fields. ABD: soft, NT, ND, BS normal.  No hepatospenomegaly or mass.  No bruits. EXT: no clubbing, cyanosis, or edema.  Musculoskeletal: no joint swelling, erythema, warmth, or tenderness.  ROM of all joints intact. Skin - no sores or suspicious lesions or rashes or color changes   Pertinent labs:  No results found for: TSH Lab Results  Component Value Date   WBC 6.7 12/14/2017   HGB 17.2 (H) 12/14/2017   HCT 51.4 12/14/2017   MCV 95.7 12/14/2017   PLT 193.0 12/14/2017   Lab Results  Component Value Date   CREATININE 1.09 12/14/2017   BUN 16 12/14/2017   NA 138 12/14/2017   K 4.1 12/14/2017    CL 100 12/14/2017   CO2 30 12/14/2017   Lab Results  Component Value Date   ALT 29 12/14/2017   AST 21 12/14/2017   ALKPHOS 47 12/14/2017   BILITOT 0.9 12/14/2017   No results found for: CHOL No results found for: HDL No results found for: LDLCALC No results found for: TRIG No results found for: CHOLHDL No results found for: PSA  No results found for: HGBA1C   ASSESSMENT AND PLAN:   Health maintenance exam: Reviewed age and gender appropriate health maintenance issues (prudent diet, regular exercise, health risks of tobacco and excessive alcohol, use of seatbelts, fire alarms in home, use of sunscreen).  Also reviewed age and  gender appropriate health screening as well as vaccine recommendations. Vaccines: Tdap-- given today.  Shingrix-- had #1 via employer [employer]).  Gave #2 today. Flu UTD through [facility] as well (will have pt bring in records of vaccines through [place]). Labs: FLP, HIV and Hep C screening.   Recent CBC and CMET were fine. Prostate ca screening: DRE and PSA is done biannually by urology b/c they manage his testosterone replacement. Colon ca screening: next colonoscopy 2020.  Recent 7 weeks of R sided abd pain--no etiology found on w/u so far. He will see his GI MD about this soon.  An After Visit Summary was printed and given to the patient.  FOLLOW UP:  Return in about 1 year (around 12/27/2018) for annual CPE (fasting).  Signed:  Crissie Sickles, MD           12/27/2017

## 2017-12-27 NOTE — Addendum Note (Signed)
Addended by: Onalee Hua on: 12/27/2017 09:32 AM   Modules accepted: Orders

## 2017-12-28 ENCOUNTER — Encounter: Payer: Self-pay | Admitting: Family Medicine

## 2017-12-28 LAB — HIV ANTIBODY (ROUTINE TESTING W REFLEX): HIV 1&2 Ab, 4th Generation: NONREACTIVE

## 2017-12-28 LAB — HEPATITIS C ANTIBODY
Hepatitis C Ab: NONREACTIVE
SIGNAL TO CUT-OFF: 0.01 (ref <1.00)

## 2017-12-31 ENCOUNTER — Encounter: Payer: Self-pay | Admitting: *Deleted

## 2017-12-31 ENCOUNTER — Other Ambulatory Visit: Payer: Self-pay | Admitting: *Deleted

## 2017-12-31 DIAGNOSIS — E7800 Pure hypercholesterolemia, unspecified: Secondary | ICD-10-CM

## 2018-02-01 ENCOUNTER — Encounter: Payer: Self-pay | Admitting: Gastroenterology

## 2018-02-01 ENCOUNTER — Ambulatory Visit (INDEPENDENT_AMBULATORY_CARE_PROVIDER_SITE_OTHER): Payer: BLUE CROSS/BLUE SHIELD | Admitting: Gastroenterology

## 2018-02-01 VITALS — BP 140/80 | HR 78 | Ht 71.0 in | Wt 249.0 lb

## 2018-02-01 DIAGNOSIS — R1011 Right upper quadrant pain: Secondary | ICD-10-CM

## 2018-02-01 NOTE — Progress Notes (Signed)
Review of pertinent gastrointestinal problems: 1.  Symptomatic cholelithiasis 2010 confirmed by ultrasound, eventual laparoscopic cholecystectomy with normal-appearing Intra-Op cholangiogram 2010. 2.  Routine risk for colon cancer, colonoscopy Dr. 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  He was recommended to have repeat screening at 10-year interval   HPI: This is a very pleasant 62 year old man who was referred to me by Tammi Sou, MD  to evaluate right upper quadrant pains.    Chief complaint is right upper quadrant, right flank pain  For the past 3 months he has had a stabbing pain in the right upper quadrant, right flank pain that is extremely similar to his gallstone related pain 8 or 9 years ago.  He does not have any associated nausea or vomiting.  The pain comes on with excessive eating and sometimes with lifting heavy objects.  He has not lost any weight.  He has had no fevers or chills.   No weight changes  nsaids rarely.  No GERD since he stopped caffeine.    Old Data Reviewed: Blood work January 2019: Normal CBC, normal complete metabolic profile, normal lipase, normal sed rate and CRP. MRI with MRCP images January 2019 was essentially normal for some possible fatty liver.  Biliary tract was completely normal status post remote cholecystectomy.     Review of systems: Pertinent positive and negative review of systems were noted in the above HPI section. All other review negative.   Past Medical History:  Diagnosis Date  . Colon polyps 01/20/2009   NON-adenomatous-recall 10 yrs  . Hepatic steatosis    Noted on MRI abd 12/2017 (done for RUQ pain)  . Hypercholesterolemia 12/2017   Recommended statin 12/2017: pt chose [facility] and repeat chol 3 mo.  .  Hypogonadism male    Managed by pt's urologist as of 12/2017 (including biannual DREs and PSAs).  . RUQ abdominal pain Dec 2018;Jan 2019   Labs nl, abd plain films nl, MRCP nl.    Past Surgical History:  Procedure Laterality Date  . CHOLECYSTECTOMY  2010  . COLONOSCOPY W/ POLYPECTOMY  01/20/2009   Multiple nonadenomatous polyps.  Recall 10 yrs.  Marland Kitchen KNEE SURGERY Left 2000 and 2017   arthroscopic  . LUMBAR SPINE SURGERY  2000   No hardware  . NASAL SEPTUM SURGERY  1981  . TONSILLECTOMY  1978    Current Outpatient Medications  Medication Sig Dispense Refill  . B-D 3CC LUER-LOK SYR 18GX1-1/2 18G X 1-1/2" 3 ML MISC UUD FOR TESTOSTERONE  6  . clobetasol cream (TEMOVATE) 7.86 % 1 APPLICATION TO AFFECTED AREA TWICE A DAY EXTERNALLY 14  0  . PAZEO 0.7 % SOLN Place 1 drop into both eyes daily.    . Salicylic Acid 3 % OINT Apply 1 application topically daily as needed.    . testosterone cypionate (DEPOTESTOSTERONE CYPIONATE) 200 MG/ML injection Inject 0.4 mLs into the muscle once a week.  1   No current facility-administered medications for this visit.     Allergies as of 02/01/2018 - Review Complete 02/01/2018  Allergen Reaction Noted  . Levofloxacin  12/23/2002    Family History  Problem Relation Age of Onset  . Arthritis Mother   . Depression Mother   . Alzheimer's disease Mother   . Alcohol abuse Father   . Arthritis  Father   . COPD Father   . Hearing loss Father   . Heart disease Father   . Colon cancer Sister     Social History   Socioeconomic History  . Marital status: Married    Spouse name: Not on file  . Number of children: Not on file  . Years of education: Not on file  . Highest education level: Not on file  Social Needs  . Financial resource strain: Not on file  . Food insecurity - worry: Not on file  . Food insecurity - inability: Not on file  . Transportation needs - medical: Not on file  . Transportation needs - non-medical: Not on file  Occupational  History  . Not on file  Tobacco Use  . Smoking status: Former Smoker    Packs/day: 1.50    Years: 3.00    Pack years: 4.50    Types: Cigarettes    Last attempt to quit: 12/11/1976    Years since quitting: 41.1  . Smokeless tobacco: Never Used  Substance and Sexual Activity  . Alcohol use: No  . Drug use: No  . Sexual activity: Not on file  Other Topics Concern  . Not on file  Social History Narrative  . Not on file     Physical Exam: BP 140/80   Pulse 78   Ht 5\' 11"  (1.803 m)   Wt 249 lb (112.9 kg)   BMI 34.73 kg/m  Constitutional: generally well-appearing Psychiatric: alert and oriented x3 Eyes: extraocular movements intact Mouth: oral pharynx moist, no lesions Neck: supple no lymphadenopathy Cardiovascular: heart regular rate and rhythm Lungs: clear to auscultation bilaterally Abdomen: soft, nontender, nondistended, no obvious ascites, no peritoneal signs, normal bowel sounds Extremities: no lower extremity edema bilaterally Skin: no lesions on visible extremities   Assessment and plan: 62 y.o. male with right upper quadrant, right flank pain  Unclear etiology but MRI and normal liver tests seem to argue against retained bile duct stone.  Given how similar the symptoms are to his previous biliary colic however I want to proceed with upper endoscopy with endoscopic ultrasound at the same time since MRI can sometimes miss small bile duct stones.  In the meantime he will start an over-the-counter proton pump inhibitor in case this is GERD related.    Please see the "Patient Instructions" section for addition details about the plan.   Owens Loffler, MD [facility] 02/01/2018, 8:31 AM  Cc: McGowen, Adrian Blackwater, MD

## 2018-02-01 NOTE — H&P (View-Only) (Signed)
Review of pertinent gastrointestinal problems: 1.  Symptomatic cholelithiasis 2010 confirmed by ultrasound, eventual laparoscopic cholecystectomy with normal-appearing Intra-Op cholangiogram 2010. 2.  Routine risk for colon cancer, colonoscopy Dr. 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  He was recommended to have repeat screening at 10-year interval   HPI: This is a very pleasant 62 year old man who was referred to me by Tammi Sou, MD  to evaluate right upper quadrant pains.    Chief complaint is right upper quadrant, right flank pain  For the past 3 months he has had a stabbing pain in the right upper quadrant, right flank pain that is extremely similar to his gallstone related pain 8 or 9 years ago.  He does not have any associated nausea or vomiting.  The pain comes on with excessive eating and sometimes with lifting heavy objects.  He has not lost any weight.  He has had no fevers or chills.   No weight changes  nsaids rarely.  No GERD since he stopped caffeine.    Old Data Reviewed: Blood work January 2019: Normal CBC, normal complete metabolic profile, normal lipase, normal sed rate and CRP. MRI with MRCP images January 2019 was essentially normal for some possible fatty liver.  Biliary tract was completely normal status post remote cholecystectomy.     Review of systems: Pertinent positive and negative review of systems were noted in the above HPI section. All other review negative.   Past Medical History:  Diagnosis Date  . Colon polyps 01/20/2009   NON-adenomatous-recall 10 yrs  . Hepatic steatosis    Noted on MRI abd 12/2017 (done for RUQ pain)  . Hypercholesterolemia 12/2017   Recommended statin 12/2017: pt chose TLC and repeat chol 3 mo.  .  Hypogonadism male    Managed by pt's urologist as of 12/2017 (including biannual DREs and PSAs).  . RUQ abdominal pain Dec 2018;Jan 2019   Labs nl, abd plain films nl, MRCP nl.    Past Surgical History:  Procedure Laterality Date  . CHOLECYSTECTOMY  2010  . COLONOSCOPY W/ POLYPECTOMY  01/20/2009   Multiple nonadenomatous polyps.  Recall 10 yrs.  Marland Kitchen KNEE SURGERY Left 2000 and 2017   arthroscopic  . LUMBAR SPINE SURGERY  2000   No hardware  . NASAL SEPTUM SURGERY  1981  . TONSILLECTOMY  1978    Current Outpatient Medications  Medication Sig Dispense Refill  . B-D 3CC LUER-LOK SYR 18GX1-1/2 18G X 1-1/2" 3 ML MISC UUD FOR TESTOSTERONE  6  . clobetasol cream (TEMOVATE) 0.86 % 1 APPLICATION TO AFFECTED AREA TWICE A DAY EXTERNALLY 14  0  . PAZEO 0.7 % SOLN Place 1 drop into both eyes daily.    . Salicylic Acid 3 % OINT Apply 1 application topically daily as needed.    . testosterone cypionate (DEPOTESTOSTERONE CYPIONATE) 200 MG/ML injection Inject 0.4 mLs into the muscle once a week.  1   No current facility-administered medications for this visit.     Allergies as of 02/01/2018 - Review Complete 02/01/2018  Allergen Reaction Noted  . Levofloxacin  12/23/2002    Family History  Problem Relation Age of Onset  . Arthritis Mother   . Depression Mother   . Alzheimer's disease Mother   . Alcohol abuse Father   . Arthritis  Father   . COPD Father   . Hearing loss Father   . Heart disease Father   . Colon cancer Sister     Social History   Socioeconomic History  . Marital status: Married    Spouse name: Not on file  . Number of children: Not on file  . Years of education: Not on file  . Highest education level: Not on file  Social Needs  . Financial resource strain: Not on file  . Food insecurity - worry: Not on file  . Food insecurity - inability: Not on file  . Transportation needs - medical: Not on file  . Transportation needs - non-medical: Not on file  Occupational  History  . Not on file  Tobacco Use  . Smoking status: Former Smoker    Packs/day: 1.50    Years: 3.00    Pack years: 4.50    Types: Cigarettes    Last attempt to quit: 12/11/1976    Years since quitting: 41.1  . Smokeless tobacco: Never Used  Substance and Sexual Activity  . Alcohol use: No  . Drug use: No  . Sexual activity: Not on file  Other Topics Concern  . Not on file  Social History Narrative  . Not on file     Physical Exam: BP 140/80   Pulse 78   Ht 5\' 11"  (1.803 m)   Wt 249 lb (112.9 kg)   BMI 34.73 kg/m  Constitutional: generally well-appearing Psychiatric: alert and oriented x3 Eyes: extraocular movements intact Mouth: oral pharynx moist, no lesions Neck: supple no lymphadenopathy Cardiovascular: heart regular rate and rhythm Lungs: clear to auscultation bilaterally Abdomen: soft, nontender, nondistended, no obvious ascites, no peritoneal signs, normal bowel sounds Extremities: no lower extremity edema bilaterally Skin: no lesions on visible extremities   Assessment and plan: 62 y.o. male with right upper quadrant, right flank pain  Unclear etiology but MRI and normal liver tests seem to argue against retained bile duct stone.  Given how similar the symptoms are to his previous biliary colic however I want to proceed with upper endoscopy with endoscopic ultrasound at the same time since MRI can sometimes miss small bile duct stones.  In the meantime he will start an over-the-counter proton pump inhibitor in case this is GERD related.    Please see the "Patient Instructions" section for addition details about the plan.   Owens Loffler, MD [facility] 02/01/2018, 8:31 AM  Cc: McGowen, Adrian Blackwater, MD

## 2018-02-01 NOTE — Patient Instructions (Addendum)
Upper EUS with radial at [facility] for RUQ pains.  Next available Thursday.  Omeprazole 20mg  OTC once daily for now.  Normal BMI (Body Mass Index- based on height and weight) is between 19 and 25. Your BMI today is Body mass index is 34.73 kg/m. Marland Kitchen Please consider follow up  regarding your BMI with your Primary Care Provider.

## 2018-02-08 DIAGNOSIS — K2970 Gastritis, unspecified, without bleeding: Secondary | ICD-10-CM

## 2018-02-08 DIAGNOSIS — B9681 Helicobacter pylori [H. pylori] as the cause of diseases classified elsewhere: Secondary | ICD-10-CM

## 2018-02-08 HISTORY — DX: Gastritis, unspecified, without bleeding: K29.70

## 2018-02-08 HISTORY — DX: Helicobacter pylori (H. pylori) as the cause of diseases classified elsewhere: B96.81

## 2018-02-10 ENCOUNTER — Encounter: Payer: Self-pay | Admitting: Family Medicine

## 2018-02-12 ENCOUNTER — Other Ambulatory Visit: Payer: Self-pay

## 2018-02-12 ENCOUNTER — Encounter (HOSPITAL_COMMUNITY): Payer: Self-pay | Admitting: Emergency Medicine

## 2018-02-14 ENCOUNTER — Other Ambulatory Visit: Payer: Self-pay

## 2018-02-14 ENCOUNTER — Ambulatory Visit (HOSPITAL_COMMUNITY): Payer: BLUE CROSS/BLUE SHIELD | Admitting: Anesthesiology

## 2018-02-14 ENCOUNTER — Encounter (HOSPITAL_COMMUNITY): Payer: Self-pay | Admitting: *Deleted

## 2018-02-14 ENCOUNTER — Encounter (HOSPITAL_COMMUNITY)
Admission: RE | Disposition: A | Discharge status: Home / Self Care | Payer: Self-pay | Source: Ambulatory Visit | Attending: Gastroenterology

## 2018-02-14 ENCOUNTER — Ambulatory Visit (HOSPITAL_COMMUNITY)
Admission: RE | Admit: 2018-02-14 | Discharge: 2018-02-14 | Disposition: A | Discharge status: Home / Self Care | Payer: BLUE CROSS/BLUE SHIELD | Source: Ambulatory Visit | Attending: Gastroenterology | Admitting: Gastroenterology

## 2018-02-14 DIAGNOSIS — Z87891 Personal history of nicotine dependence: Secondary | ICD-10-CM | POA: Insufficient documentation

## 2018-02-14 DIAGNOSIS — Z9049 Acquired absence of other specified parts of digestive tract: Secondary | ICD-10-CM | POA: Diagnosis not present

## 2018-02-14 DIAGNOSIS — K2990 Gastroduodenitis, unspecified, without bleeding: Secondary | ICD-10-CM

## 2018-02-14 DIAGNOSIS — R1011 Right upper quadrant pain: Secondary | ICD-10-CM

## 2018-02-14 DIAGNOSIS — K2950 Unspecified chronic gastritis without bleeding: Secondary | ICD-10-CM | POA: Insufficient documentation

## 2018-02-14 DIAGNOSIS — K2970 Gastritis, unspecified, without bleeding: Secondary | ICD-10-CM

## 2018-02-14 DIAGNOSIS — E7800 Pure hypercholesterolemia, unspecified: Secondary | ICD-10-CM | POA: Diagnosis not present

## 2018-02-14 DIAGNOSIS — Z79899 Other long term (current) drug therapy: Secondary | ICD-10-CM | POA: Diagnosis not present

## 2018-02-14 DIAGNOSIS — B9681 Helicobacter pylori [H. pylori] as the cause of diseases classified elsewhere: Secondary | ICD-10-CM | POA: Insufficient documentation

## 2018-02-14 DIAGNOSIS — Z86010 Personal history of colonic polyps: Secondary | ICD-10-CM | POA: Diagnosis not present

## 2018-02-14 HISTORY — PX: ESOPHAGOGASTRODUODENOSCOPY: SHX1529

## 2018-02-14 HISTORY — PX: EUS: SHX5427

## 2018-02-14 SURGERY — UPPER ENDOSCOPIC ULTRASOUND (EUS) LINEAR
Anesthesia: Monitor Anesthesia Care

## 2018-02-14 MED ORDER — PROPOFOL 500 MG/50ML IV EMUL
INTRAVENOUS | Status: DC | PRN
  Administered 2018-02-14: 150 ug/kg/min via INTRAVENOUS

## 2018-02-14 MED ORDER — PROPOFOL 10 MG/ML IV BOLUS
INTRAVENOUS | Status: AC
  Filled 2018-02-14: qty 40

## 2018-02-14 MED ORDER — PROPOFOL 500 MG/50ML IV EMUL
INTRAVENOUS | Status: DC | PRN
  Administered 2018-02-14: 10 mg via INTRAVENOUS
  Administered 2018-02-14: 60 mg via INTRAVENOUS

## 2018-02-14 MED ORDER — ONDANSETRON HCL 4 MG/2ML IJ SOLN
INTRAMUSCULAR | Status: DC | PRN
  Administered 2018-02-14: 4 mg via INTRAVENOUS

## 2018-02-14 MED ORDER — LACTATED RINGERS IV SOLN
INTRAVENOUS | Status: DC
  Administered 2018-02-14: 1000 mL via INTRAVENOUS

## 2018-02-14 MED ORDER — SODIUM CHLORIDE 0.9 % IV SOLN: INTRAVENOUS | Status: DC

## 2018-02-14 MED ORDER — PROPOFOL 10 MG/ML IV BOLUS
INTRAVENOUS | Status: AC
  Filled 2018-02-14: qty 20

## 2018-02-14 NOTE — Discharge Instructions (Signed)
YOU HAD AN ENDOSCOPIC PROCEDURE TODAY: Refer to the procedure report and other information in the discharge instructions given to you for any specific questions about what was found during the examination. If this information does not answer your questions, please call [place] office at [phone] to clarify.   YOU SHOULD EXPECT: Some feelings of bloating in the abdomen. Passage of more gas than usual. Walking can help get rid of the air that was put into your GI tract during the procedure and reduce the bloating. If you had a lower endoscopy (such as a colonoscopy or flexible sigmoidoscopy) you may notice spotting of blood in your stool or on the toilet paper. Some abdominal soreness may be present for a day or two, also.  DIET: Your first meal following the procedure should be a light meal and then it is ok to progress to your normal diet. A half-sandwich or bowl of soup is an example of a good first meal. Heavy or fried foods are harder to digest and may make you feel nauseous or bloated. Drink plenty of fluids but you should avoid alcoholic beverages for 24 hours. If you had a esophageal dilation, please see attached instructions for diet.    ACTIVITY: Your care partner should take you home directly after the procedure. You should plan to take it easy, moving slowly for the rest of the day. You can resume normal activity the day after the procedure however YOU SHOULD NOT DRIVE, use power tools, machinery or perform tasks that involve climbing or major physical exertion for 24 hours (because of the sedation medicines used during the test).   SYMPTOMS TO REPORT IMMEDIATELY: A gastroenterologist can be reached at any hour. Please call [phone]  for any of the following symptoms:   Following upper endoscopy (EGD, EUS, ERCP, esophageal dilation) Vomiting of blood or coffee ground material  New, significant abdominal pain  New, significant chest pain or pain under the shoulder blades  Painful or  persistently difficult swallowing  New shortness of breath  Black, tarry-looking or red, bloody stools  FOLLOW UP:  If any biopsies were taken you will be contacted by phone or by letter within the next 1-3 weeks. Call [phone]  if you have not heard about the biopsies in 3 weeks.  Please also call with any specific questions about appointments or follow up tests.  

## 2018-02-14 NOTE — Interval H&P Note (Signed)
History and Physical Interval Note:  02/14/2018 9:55 AM  Jordan Berg  has presented today for surgery, with the diagnosis of RUQ pain  The various methods of treatment have been discussed with the patient and family. After consideration of risks, benefits and other options for treatment, the patient has consented to  Procedure(s): UPPER ENDOSCOPIC ULTRASOUND (EUS) LINEAR (N/A) as a surgical intervention .  The patient's history has been reviewed, patient examined, no change in status, stable for surgery.  I have reviewed the patient's chart and labs.  Questions were answered to the patient's satisfaction.     Milus Banister

## 2018-02-14 NOTE — Anesthesia Preprocedure Evaluation (Addendum)
Anesthesia Evaluation  Patient identified by MRN, date of birth, ID band Patient awake    Reviewed: Allergy & Precautions, NPO status , Patient's Chart, lab work & pertinent test results  Airway Mallampati: II  TM Distance: >3 FB Neck ROM: Full    Dental   Pulmonary former smoker,    breath sounds clear to auscultation       Cardiovascular negative cardio ROS   Rhythm:Regular Rate:Normal     Neuro/Psych negative neurological ROS     GI/Hepatic Neg liver ROS, GERD  Medicated,  Endo/Other  negative endocrine ROS  Renal/GU negative Renal ROS     Musculoskeletal   Abdominal (+) + obese,   Peds  Hematology negative hematology ROS (+)   Anesthesia Other Findings   Reproductive/Obstetrics                             Anesthesia Physical Anesthesia Plan  ASA: II  Anesthesia Plan: MAC   Post-op Pain Management:    Induction: Intravenous  PONV Risk Score and Plan: 1 and Propofol infusion, Ondansetron and Treatment may vary due to age or medical condition  Airway Management Planned: Natural Airway and Nasal Cannula  Additional Equipment:   Intra-op Plan:   Post-operative Plan:   Informed Consent: I have reviewed the patients History and Physical, chart, labs and discussed the procedure including the risks, benefits and alternatives for the proposed anesthesia with the patient or authorized representative who has indicated his/her understanding and acceptance.     Plan Discussed with:   Anesthesia Plan Comments:         Anesthesia Quick Evaluation

## 2018-02-14 NOTE — Anesthesia Postprocedure Evaluation (Signed)
Anesthesia Post Note  Patient: Jordan Berg  Procedure(s) Performed: UPPER ENDOSCOPIC ULTRASOUND (EUS) LINEAR (N/A )     Patient location during evaluation: PACU Anesthesia Type: MAC Level of consciousness: awake and alert Pain management: pain level controlled Vital Signs Assessment: post-procedure vital signs reviewed and stable Respiratory status: spontaneous breathing, nonlabored ventilation, respiratory function stable and patient connected to nasal cannula oxygen Cardiovascular status: stable and blood pressure returned to baseline Postop Assessment: no apparent nausea or vomiting Anesthetic complications: no    Last Vitals:  Vitals:   02/14/18 1110 02/14/18 1120  BP: 123/82 (!) 155/91  Pulse: 63 63  Resp: 11 14  Temp:    SpO2: 95% 98%    Last Pain:  Vitals:   02/14/18 1056  TempSrc: Oral                 Tiajuana Amass

## 2018-02-14 NOTE — Op Note (Signed)
[facility] Patient Name: Jordan Berg Procedure Date: 02/14/2018 MRN: [id number] Attending MD: Milus Banister , MD Date of Birth: 27-Jul-1956 CSN: [id number] Age: 62 Admit Type: Outpatient Procedure:                Upper EUS Indications:              Abdominal pain in the right upper quadrant;                            previous cholecystectomy; normal recent MRI/MRCP,                            normal LFTS Providers:                Milus Banister, MD, Zenon Mayo, RN, Laurena Spies, Technician Referring MD:              Medicines:                Monitored Anesthesia Care Complications:            No immediate complications. Estimated blood loss:                            None. Estimated Blood Loss:     Estimated blood loss: none. Procedure:                Pre-Anesthesia Assessment:                           - Prior to the procedure, a History and Physical                            was performed, and patient medications and                            allergies were reviewed. The patient's tolerance of                            previous anesthesia was also reviewed. The risks                            and benefits of the procedure and the sedation                            options and risks were discussed with the patient.                            All questions were answered, and informed consent                            was obtained. Prior Anticoagulants: The patient has                            taken no previous anticoagulant or antiplatelet  agents. ASA Grade Assessment: II - A patient with                            mild systemic disease. After reviewing the risks                            and benefits, the patient was deemed in                            satisfactory condition to undergo the procedure.                           After obtaining informed consent, the endoscope was              passed under direct vision. Throughout the                            procedure, the patient's blood pressure, pulse, and                            oxygen saturations were monitored continuously. The                            [id number] ([id number]) scope was introduced through                            the mouth, and advanced to the second part of                            duodenum. The upper EUS was accomplished without                            difficulty. The patient tolerated the procedure                            well. Scope In: Scope Out: Findings:      Endoscopic Finding :      1. Mild inflammation was found in the gastric antrum. Biopsies were       taken with a cold forceps for histology.      2. Otherwise normal UGI tract.      Endosonographic Finding :      1. Gallbladder surgicall absent      2. CBD was normal (non-dilated and without filling defects).      3. Pancreatic parenchyma and main pancreatic duct were normal.      4. No periportal adenopathy.      5. Limited views of the liver, spleen, portal and splenic vessels were       all normal. Impression:               - No retained bile duct stones.                           - Mild non-specific gastritis, biopsied to check  for H. pylori.                           - Pending the biopsy results, will consider further                            workup (CT scan abdomen) for the RUQ pains. These                            may be scar tissue related. Moderate Sedation:      N/A- Per Anesthesia Care Recommendation:           - Discharge patient to home (ambulatory). Procedure Code(s):        --- Professional ---                           [phone], Esophagogastroduodenoscopy, flexible,                            transoral; with endoscopic ultrasound examination                            limited to the esophagus, stomach or duodenum, and                            adjacent structures                            43239, 59, Esophagogastroduodenoscopy, flexible,                            transoral; with biopsy, single or multiple Diagnosis Code(s):        --- Professional ---                           K29.70, Gastritis, unspecified, without bleeding                           R10.11, Right upper quadrant pain CPT copyright 2016 American Medical Association. All rights reserved. The codes documented in this report are preliminary and upon coder review may  be revised to meet current compliance requirements. Milus Banister, MD 02/14/2018 10:54:43 AM This report has been signed electronically. Number of Addenda: 0

## 2018-02-14 NOTE — Transfer of Care (Signed)
Immediate Anesthesia Transfer of Care Note  Patient: Jordan Berg  Procedure(s) Performed: UPPER ENDOSCOPIC ULTRASOUND (EUS) LINEAR (N/A )  Patient Location: PACU  Anesthesia Type:MAC  Level of Consciousness: drowsy, patient cooperative and responds to stimulation  Airway & Oxygen Therapy: Patient Spontanous Breathing and Patient connected to nasal cannula oxygen  Post-op Assessment: Report given to RN and Post -op Vital signs reviewed and stable  Post vital signs: Reviewed and stable  Last Vitals:  Vitals:   02/14/18 0926  BP: (!) 141/97  Pulse: 67  Resp: 15  Temp: 36.7 C    Last Pain:  Vitals:   02/14/18 0926  TempSrc: Oral         Complications: No apparent anesthesia complications

## 2018-02-14 NOTE — Anesthesia Procedure Notes (Signed)
Date/Time: 02/14/2018 10:13 AM Performed by: Glory Buff, CRNA Oxygen Delivery Method: Nasal cannula

## 2018-02-18 ENCOUNTER — Encounter: Payer: Self-pay | Admitting: Family Medicine

## 2018-02-19 ENCOUNTER — Telehealth: Payer: Self-pay | Admitting: Gastroenterology

## 2018-02-19 ENCOUNTER — Other Ambulatory Visit: Payer: Self-pay

## 2018-02-19 MED ORDER — BIS SUBCIT-METRONID-TETRACYC 140-125-125 MG PO CAPS: 3.0000 | ORAL_CAPSULE | Freq: Three times a day (TID) | ORAL | 0 refills | Status: DC

## 2018-02-19 MED ORDER — OMEPRAZOLE 20 MG PO CPDR: 20.0000 mg | DELAYED_RELEASE_CAPSULE | Freq: Two times a day (BID) | ORAL | 0 refills | Status: DC

## 2018-02-19 NOTE — Telephone Encounter (Signed)
Patient wife concerned about pt not seeing Dr.Jaocbs until May for follow up and still has several questions regarding results of endo done on 3.7.19.

## 2018-02-20 NOTE — Telephone Encounter (Signed)
The pt has been advised to call PCP for musculoskeletal pain he feels when lifting, etc.  The right pain is on the side and felt to be possible scar tissue per Dr Ardis Hughs note.  The pt will call PCP and discuss.

## 2018-04-09 ENCOUNTER — Other Ambulatory Visit: Payer: Self-pay

## 2018-04-09 ENCOUNTER — Encounter: Payer: Self-pay | Admitting: *Deleted

## 2018-04-09 ENCOUNTER — Emergency Department
Admission: EM | Admit: 2018-04-09 | Discharge: 2018-04-09 | Disposition: A | Discharge status: Home / Self Care | Payer: BLUE CROSS/BLUE SHIELD | Source: Home / Self Care

## 2018-04-09 DIAGNOSIS — H65191 Other acute nonsuppurative otitis media, right ear: Secondary | ICD-10-CM | POA: Diagnosis not present

## 2018-04-09 MED ORDER — AMOXICILLIN-POT CLAVULANATE 875-125 MG PO TABS: 1.0000 | ORAL_TABLET | Freq: Two times a day (BID) | ORAL | 0 refills | Status: DC

## 2018-04-09 NOTE — ED Provider Notes (Signed)
[facility]    CSN: [id number] Arrival date & time: 04/09/18  1345     History   Chief Complaint Chief Complaint  Patient presents with  . Otalgia    HPI Jordan Berg is a 62 y.o. male.   The history is provided by the patient. No language interpreter was used.  Otalgia  Location:  Right Behind ear:  No abnormality Quality:  Aching Severity:  Moderate Duration:  3 days Timing:  Constant Progression:  Worsening Chronicity:  New Relieved by:  Nothing Worsened by:  Nothing Ineffective treatments:  None tried Associated symptoms: congestion   Risk factors: recent travel     Past Medical History:  Diagnosis Date  . Colon polyps 01/20/2009   NON-adenomatous-recall 10 yrs  . Helicobacter pylori gastritis 02/2018   EGD+  . Hepatic steatosis    Noted on MRI abd 12/2017 (done for RUQ pain)  . Hypercholesterolemia 12/2017   Recommended statin 12/2017: pt chose TLC and repeat chol 3 mo.  . Hypogonadism male    Managed by pt's urologist as of 12/2017 (including biannual DREs and PSAs).  . RUQ abdominal pain Dec 2018;Jan 2019   Labs nl, abd plain films nl, MRCP nl.  GI 01/2018--plan is upper EUS.    Patient Active Problem List   Diagnosis Date Noted  . Gastritis and gastroduodenitis   . Hypercholesterolemia 12/11/2017  . CHOLELITHIASIS 12/28/2008  . RUQ pain 12/25/2008  . ABDOMINAL PAIN 12/24/2008    Past Surgical History:  Procedure Laterality Date  . CHOLECYSTECTOMY  2010  . COLONOSCOPY W/ POLYPECTOMY  01/20/2009   Multiple nonadenomatous polyps.  Recall 10 yrs.  . ESOPHAGOGASTRODUODENOSCOPY  02/14/2018   Chronic active gastritis, + H PYLORI  . EUS N/A 02/14/2018   Procedure: UPPER ENDOSCOPIC ULTRASOUND (EUS) LINEAR;  Surgeon: Milus Banister, MD;  Location: [facility];  Service: Endoscopy;  Laterality: N/A;  . KNEE SURGERY Left 2000 and 2017   arthroscopic  . LUMBAR SPINE SURGERY  2000   No hardware  . NASAL SEPTUM SURGERY  1981  .  TONSILLECTOMY  1978       Home Medications    Prior to Admission medications   Medication Sig Start Date End Date Taking? Authorizing Provider  amoxicillin-clavulanate (AUGMENTIN) 875-125 MG tablet Take 1 tablet by mouth every 12 (twelve) hours. 04/09/18   Fransico Meadow, PA-C  bismuth-metronidazole-tetracycline [facility]) [phone] MG capsule Take 3 capsules by mouth 4 (four) times daily -  before meals and at bedtime. 02/19/18   Milus Banister, MD  clobetasol cream (TEMOVATE) 0.05 % Apply to affected area twice daily as needed for dry skin 11/06/17   [provider]  ibuprofen (ADVIL,MOTRIN) 200 MG tablet Take 400 mg by mouth every 6 (six) hours as needed for headache or moderate pain.    [provider]  Misc Natural Products (PROSTATE HEALTH PO) Take 1 capsule by mouth daily.    [provider]  omeprazole (PRILOSEC) 20 MG capsule Take 20 mg by mouth daily. For 14 days    [provider]  omeprazole (PRILOSEC) 20 MG capsule Take 1 capsule (20 mg total) by mouth 2 (two) times daily before a meal. 02/19/18   Milus Banister, MD  PAZEO 0.7 % SOLN Place 1 drop into both eyes daily. 11/24/17   [provider]  Salicylic Acid 3 % OINT Apply 1 application topically daily as needed (for remove dry, dead skin).     [provider]  testosterone cypionate (DEPOTESTOSTERONE CYPIONATE) 200 MG/ML injection Inject 60 mg into the muscle every Thursday.  11/02/17   [provider]    Family History Family History  Problem Relation Age of Onset  . Arthritis Mother   . Depression Mother   . Alzheimer's disease Mother   . Alcohol abuse Father   . Arthritis Father   . COPD Father   . Hearing loss Father   . Heart disease Father   . Colon cancer Sister     Social History Social History   Tobacco Use  . Smoking status: Former Smoker    Packs/day: 1.50    Years: 3.00    Pack years: 4.50    Types: Cigarettes    Last attempt to  quit: 12/11/1976    Years since quitting: 41.3  . Smokeless tobacco: Never Used  Substance Use Topics  . Alcohol use: No  . Drug use: No     Allergies   Levofloxacin   Review of Systems Review of Systems  HENT: Positive for congestion and ear pain.   All other systems reviewed and are negative.    Physical Exam Triage Vital Signs ED Triage Vitals [04/09/18 1406]  Enc Vitals Group     BP 124/86     Pulse Rate 99     Resp      Temp 98.4 F (36.9 C)     Temp Source Oral     SpO2 95 %     Weight 252 lb (114.3 kg)     Height      Head Circumference      Peak Flow      Pain Score 6     Pain Loc      Pain Edu?      Excl. in [place]?    No data found.  Updated Vital Signs BP 124/86 (BP Location: Right Arm)   Pulse 99   Temp 98.4 F (36.9 C) (Oral)   Wt 252 lb (114.3 kg)   SpO2 95%   BMI 35.15 kg/m   Visual Acuity Right Eye Distance:   Left Eye Distance:   Bilateral Distance:    Right Eye Near:   Left Eye Near:    Bilateral Near:     Physical Exam  Constitutional: He appears well-developed and well-nourished.  HENT:  Head: Normocephalic and atraumatic.  Nose: Nose normal.  Mouth/Throat: Oropharynx is clear and moist.  Right tm erythematous, canal red distally,    Eyes: Conjunctivae are normal.  Neck: Neck supple.  Cardiovascular: Normal rate and regular rhythm.  No murmur heard. Pulmonary/Chest: Effort normal and breath sounds normal. No respiratory distress.  Abdominal: Soft. There is no tenderness.  Musculoskeletal: He exhibits no edema.  Neurological: He is alert.  Skin: Skin is warm and dry.  Psychiatric: He has a normal mood and affect.  Nursing note and vitals reviewed.    UC Treatments / Results  Labs (all labs ordered are listed, but only abnormal results are displayed) Labs Reviewed - No data to display  EKG None  Radiology No results found.  Procedures Procedures (including critical care time)  Medications Ordered in  UC Medications - No data to display  Initial Impression / Assessment and Plan / UC Course  I have reviewed the triage vital signs and the nursing notes.  Pertinent labs & imaging results that were available during my care of the patient were reviewed by me and considered in my medical decision making (see chart for details).  Final Clinical Impressions(s) / UC Diagnoses   Final diagnoses:  Other non-recurrent acute nonsuppurative otitis media of right ear   Discharge Instructions   None    ED Prescriptions    Medication Sig Dispense Auth. Provider   amoxicillin-clavulanate (AUGMENTIN) 875-125 MG tablet Take 1 tablet by mouth every 12 (twelve) hours. 20 tablet Fransico Meadow, [place]     Controlled Substance Prescriptions [place] Controlled Substance Registry consulted? Not Applicable  An After Visit Summary was printed and given to the patient.    Fransico Meadow, [place] 04/09/18 1433

## 2018-04-09 NOTE — ED Triage Notes (Signed)
Patient c/o eight ear pain that is now radiating to his throat x 2 days.

## 2018-05-01 ENCOUNTER — Encounter: Payer: Self-pay | Admitting: Gastroenterology

## 2018-05-01 ENCOUNTER — Ambulatory Visit: Payer: BLUE CROSS/BLUE SHIELD | Admitting: Gastroenterology

## 2018-05-01 ENCOUNTER — Other Ambulatory Visit: Payer: BLUE CROSS/BLUE SHIELD

## 2018-05-01 ENCOUNTER — Ambulatory Visit (INDEPENDENT_AMBULATORY_CARE_PROVIDER_SITE_OTHER): Payer: BLUE CROSS/BLUE SHIELD | Admitting: Gastroenterology

## 2018-05-01 VITALS — BP 120/70 | HR 76 | Ht 71.0 in | Wt 243.0 lb

## 2018-05-01 DIAGNOSIS — Z8619 Personal history of other infectious and parasitic diseases: Secondary | ICD-10-CM | POA: Diagnosis not present

## 2018-05-01 NOTE — Patient Instructions (Addendum)
H. Pylori stool antigen testing to check for eradication.  Normal BMI (Body Mass Index- based on height and weight) is between 19 and 25. Your BMI today is Body mass index is 33.89 kg/m. Marland Kitchen Please consider follow up  regarding your BMI with your Primary Care Provider.

## 2018-05-01 NOTE — Progress Notes (Signed)
Review of pertinent gastrointestinal problems: 1.  Symptomatic cholelithiasis 2010 confirmed by ultrasound, eventual laparoscopic cholecystectomy with normal-appearing Intra-Op cholangiogram 2010. 2.  Routine risk for colon cancer, colonoscopy Dr. 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  He was recommended to have repeat screening at 10-year interval 3. RUQ pains 2019, likely from H. Pylori gastritis: LFTs normal, MRI with MRCP essentially normal except fatty appearing liver.  EUS Dr. Ardis Hughs 02/2018 was essentially normal as well however mild gastritis was biospied and path + for H. Pylori. Was treated with pylera.  HPI: This is a very pleasant 62 year old man whom I last saw the time of an upper endoscopic ultrasound with EGD about 2 months ago.  See those results summarized above  Chief complaint is right upper quadrant pain, H. pylori gastritis He completed the Pylera treatment for H pylori along with over-the-counter proton pump inhibitor at the same time.  His right upper quadrant pains have nearly completely subsided.  He still does have them occasionally with certain body positions.  He had a vomiting illness about 10 days ago and was having the same pain again.   ROS: complete GI ROS as described in HPI, all other review negative.  Constitutional:  No unintentional weight loss   Past Medical History:  Diagnosis Date  . Colon polyps 01/20/2009   NON-adenomatous-recall 10 yrs  . Helicobacter pylori gastritis 02/2018   EGD+  . Hepatic steatosis    Noted on MRI abd 12/2017 (done for RUQ pain)  . Hypercholesterolemia 12/2017   Recommended statin 12/2017: pt chose [facility] and repeat chol 3 mo.  . Hypogonadism male    Managed by pt's urologist as of 12/2017 (including biannual DREs and  PSAs).  . RUQ abdominal pain Dec 2018;Jan 2019   Labs nl, abd plain films nl, MRCP nl.  GI 01/2018--plan is upper EUS.    Past Surgical History:  Procedure Laterality Date  . CHOLECYSTECTOMY  2010  . COLONOSCOPY W/ POLYPECTOMY  01/20/2009   Multiple nonadenomatous polyps.  Recall 10 yrs.  . ESOPHAGOGASTRODUODENOSCOPY  02/14/2018   Chronic active gastritis, + H PYLORI  . EUS N/A 02/14/2018   Procedure: UPPER ENDOSCOPIC ULTRASOUND (EUS) LINEAR;  Surgeon: Milus Banister, MD;  Location: [facility];  Service: Endoscopy;  Laterality: N/A;  . KNEE SURGERY Left 2000 and 2017   arthroscopic  . LUMBAR SPINE SURGERY  2000   No hardware  . NASAL SEPTUM SURGERY  1981  . TONSILLECTOMY  1978    Current Outpatient Medications  Medication Sig Dispense Refill  . bismuth-metronidazole-tetracycline (PYLERA) 140-125-125 MG capsule Take 3 capsules by mouth 4 (four) times daily -  before meals and at bedtime. 168 capsule 0  . clobetasol cream (TEMOVATE) 0.05 % Apply to affected area twice daily as needed for dry skin  0  . ibuprofen (ADVIL,MOTRIN) 200 MG tablet Take 400 mg by mouth every 6 (six) hours as needed for headache or moderate pain.    . Misc Natural Products (PROSTATE HEALTH PO) Take 1 capsule by mouth daily.    Marland Kitchen PAZEO 0.7 % SOLN Place 1 drop into both eyes daily.    . Salicylic Acid 3 % OINT Apply 1 application topically daily as needed (for remove dry, dead skin).     Marland Kitchen testosterone cypionate (DEPOTESTOSTERONE CYPIONATE) 200  MG/ML injection Inject 60 mg into the muscle every Thursday.   1   No current facility-administered medications for this visit.     Allergies as of 05/01/2018 - Review Complete 05/01/2018  Allergen Reaction Noted  . Levofloxacin Itching and Other (See Comments) 12/23/2002    Family History  Problem Relation Age of Onset  . Arthritis Mother   . Depression Mother   . Alzheimer's disease Mother   . Alcohol abuse Father   . Arthritis Father   . COPD Father   .  Hearing loss Father   . Heart disease Father   . Colon cancer Sister     Social History   Socioeconomic History  . Marital status: Married    Spouse name: Not on file  . Number of children: Not on file  . Years of education: Not on file  . Highest education level: Not on file  Occupational History  . Not on file  Social Needs  . Financial resource strain: Not on file  . Food insecurity:    Worry: Not on file    Inability: Not on file  . Transportation needs:    Medical: Not on file    Non-medical: Not on file  Tobacco Use  . Smoking status: Former Smoker    Packs/day: 1.50    Years: 3.00    Pack years: 4.50    Types: Cigarettes    Last attempt to quit: 12/11/1976    Years since quitting: 41.4  . Smokeless tobacco: Never Used  Substance and Sexual Activity  . Alcohol use: No  . Drug use: No  . Sexual activity: Not on file  Lifestyle  . Physical activity:    Days per week: Not on file    Minutes per session: Not on file  . Stress: Not on file  Relationships  . Social connections:    Talks on phone: Not on file    Gets together: Not on file    Attends religious service: Not on file    Active member of club or organization: Not on file    Attends meetings of clubs or organizations: Not on file    Relationship status: Not on file  . Intimate partner violence:    Fear of current or ex partner: Not on file    Emotionally abused: Not on file    Physically abused: Not on file    Forced sexual activity: Not on file  Other Topics Concern  . Not on file  Social History Narrative  . Not on file     Physical Exam: BP 120/70   Pulse 76   Ht 5\' 11"  (1.803 m)   Wt 243 lb (110.2 kg)   BMI 33.89 kg/m  Constitutional: generally well-appearing Psychiatric: alert and oriented x3 Abdomen: soft, nontender, nondistended, no obvious ascites, no peritoneal signs, normal bowel sounds No peripheral edema noted in lower extremities  Assessment and plan: 62 y.o. male with H.  pylori gastritis, right upper quadrant pains  First it is not clear to me that all of his right upper quadrant discomforts are related to the H pylori.  Either way work-up for the right upper quadrant pains has been otherwise unrevealing and the pains are nearly completely gone.  I would like to check to see if the H. pylori was eradicated with pyler with stool antigen testing.a   He may be changing insurance companies later this year, possibly retiring and was wondering if he would be able to have colon  cancer screening with colonoscopy of a bit sooner than his due date February 2020.  I told him as we get closer to that time if he knows if he is going to be changing his insurance finds he should contact us and I am sure we will be able to accommodate him.  Please see the "Patient Instructions" section for addition details about the plan.  Owens Loffler, MD [facility] 05/01/2018, 2:03 PM

## 2018-05-07 ENCOUNTER — Other Ambulatory Visit: Payer: BLUE CROSS/BLUE SHIELD

## 2018-05-07 DIAGNOSIS — Z8619 Personal history of other infectious and parasitic diseases: Secondary | ICD-10-CM

## 2018-05-08 LAB — HELICOBACTER PYLORI  SPECIAL ANTIGEN
MICRO NUMBER: 90640496
SPECIMEN QUALITY: ADEQUATE

## 2018-05-09 ENCOUNTER — Telehealth: Payer: Self-pay | Admitting: Gastroenterology

## 2018-05-09 NOTE — Telephone Encounter (Signed)
-----   Message from Milus Banister, MD sent at 05/09/2018  8:11 AM EDT -----   Please call the patient.  His stool testing was negative; the H. Pylori is gone.  Should continue with the suggestions outlined at recent visit.

## 2018-05-09 NOTE — Telephone Encounter (Signed)
I spoke to patient to inform him that his stool testing was negative. The H. Pylori is gone. Patient voiced understanding.

## 2018-05-21 ENCOUNTER — Encounter: Payer: Self-pay | Admitting: Family Medicine

## 2018-10-04 IMAGING — MR MR 3D RECON AT [facility]
18 of 19 series · 43 of 48 positions shown · IV contrast (20 ML MULTIHANCE)
Comparison: None.

CLINICAL DATA: Right upper quadrant pain.  Prior cholecystectomy.

EXAM:
MRI ABDOMEN WITHOUT AND WITH CONTRAST (INCLUDING MRCP)
TECHNIQUE: Multiplanar multisequence MR imaging of the abdomen was performed
both before and after the administration of intravenous contrast.
Heavily T2-weighted images of the biliary and pancreatic ducts were
obtained, and three-dimensional MRCP images were rendered by post
processing.
CONTRAST:  20mL MULTIHANCE GADOBENATE DIMEGLUMINE 529 MG/ML IV SOLN

[Series 2: bSSFP · coronal · 6.5mm · 0.74mm/px · 1 of 31 slices shown]
[im 1/31]
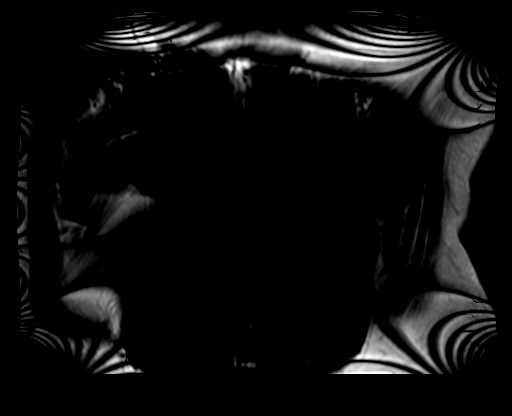

[Series 3: T2 fat-sat · axial · 6.5mm · 1.56mm/px · 1 of 32 slices shown]
[im 1/32]
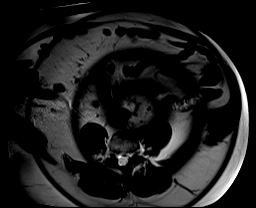

[Series 4: axial ssfse / · axial · 6.5mm · 1.25mm/px · 1 of 32 slices shown]
[im 1/32]
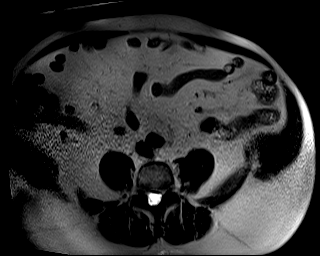

[Series 5: T1 · axial · 6.5mm · 0.78mm/px · z∈[-120,+132]mm · 2 of 64 slices shown]
[im 1/64]
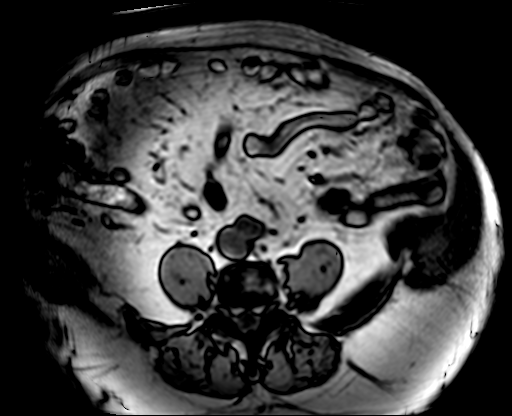
[im 64/64]
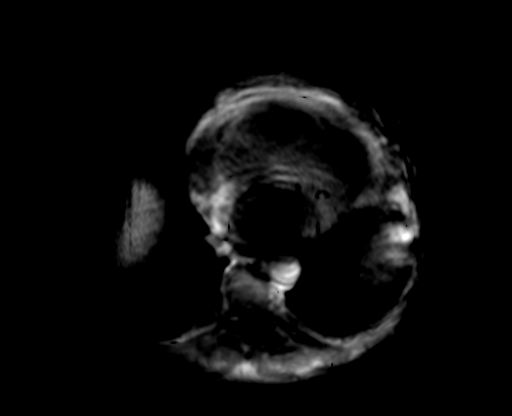

[Series 6: axial dynamic pre · axial · non-contrast · 4.0mm · 1.31mm/px · z∈[-120,+132]mm · 3 of 64 slices shown]
[im 1/64]
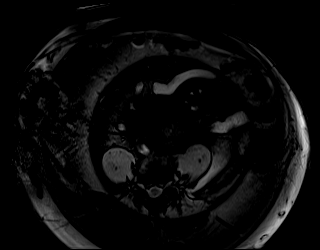
[im 32/64]
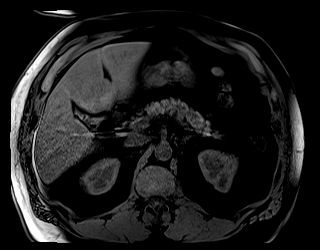
[im 64/64]
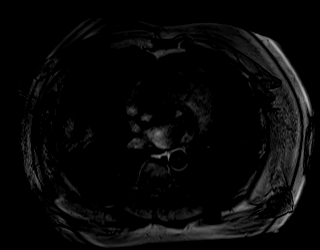

[Series 7: MRCP · coronal · 50.0mm · 0.78mm/px · 1 of 3 slices shown]
[im 1/3]
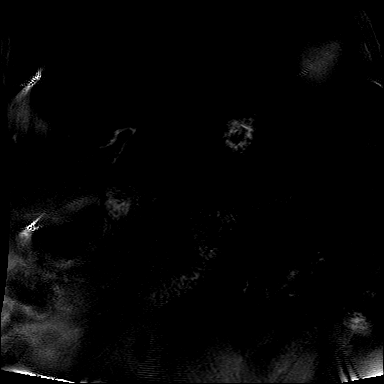

[Series 8: cor thins · coronal · 3.5mm · 0.94mm/px · 1 of 14 slices shown]
[im 1/14]
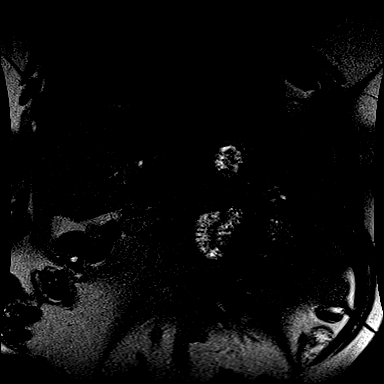

[Series 9: DWI · axial · 6.5mm · 2.08mm/px · z∈[-136,+148]mm · 5 of 108 slices shown]
[im 1/108]
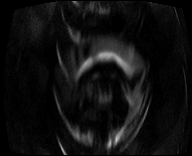
[im 27/108]
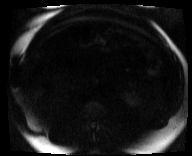
[im 54/108]
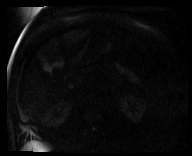
[im 81/108]
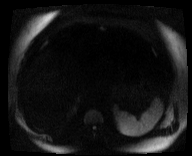
[im 108/108]
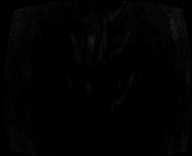

[Series 10: ax dwi_adc · axial · 6.5mm · 2.08mm/px · z∈[-136,+148]mm · 2 of 36 slices shown]
[im 1/36]
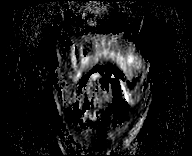
[im 36/36]
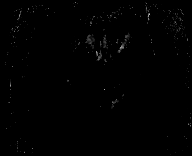

[Series 13: T2 · coronal · 1.5mm · 0.99mm/px · 2 of 52 slices shown]
[im 1/52]
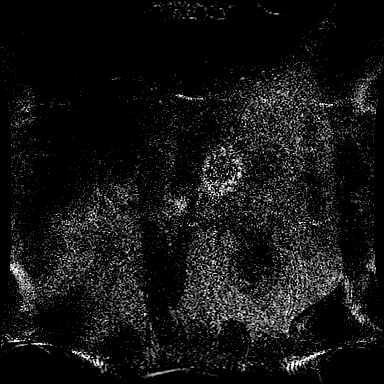
[im 52/52]
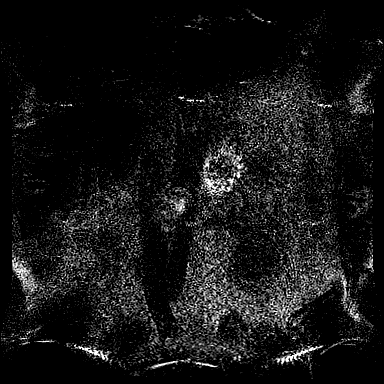

[Series 15: axial dynamic post · axial · 4.0mm · 1.31mm/px · z∈[-120,+132]mm · 3 of 64 slices shown (1 of 6)]
[im 1/64]
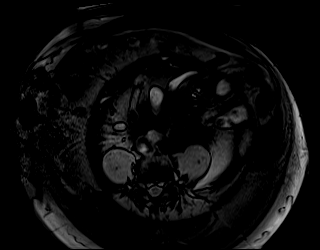
[im 32/64]
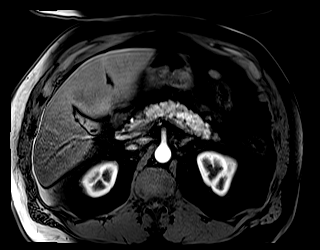
[im 64/64]
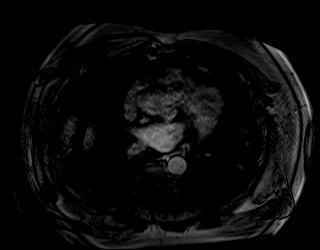

[Series 16: axial dynamic post · axial · 4.0mm · 1.31mm/px · z∈[-120,+132]mm · 3 of 64 slices shown (2 of 6)]
[im 1/64]
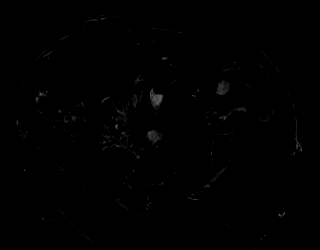
[im 32/64]
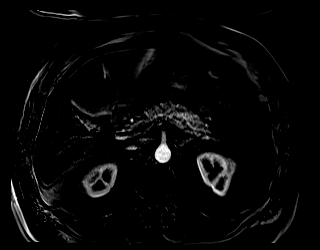
[im 64/64]
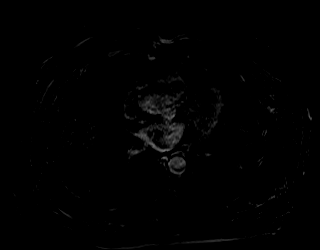

[Series 17: axial dynamic post · axial · 4.0mm · 1.31mm/px · z∈[-120,+132]mm · 3 of 64 slices shown (3 of 6)]
[im 1/64]
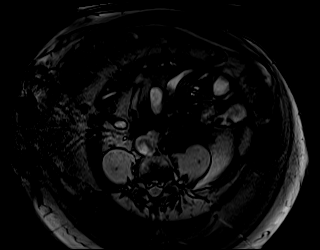
[im 32/64]
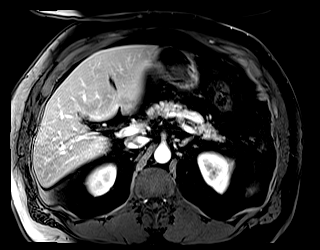
[im 64/64]
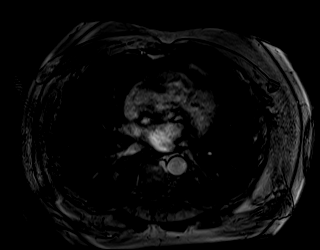

[Series 18: axial dynamic post · axial · 4.0mm · 1.31mm/px · z∈[-120,+132]mm · 3 of 64 slices shown (4 of 6)]
[im 1/64]
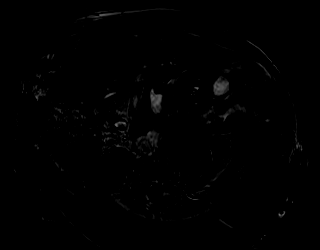
[im 32/64]
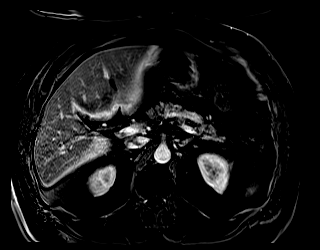
[im 64/64]
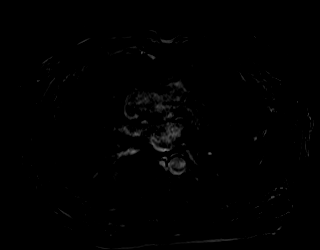

[Series 19: axial dynamic post · axial · 4.0mm · 1.31mm/px · z∈[-120,+132]mm · 3 of 64 slices shown (5 of 6)]
[im 1/64]
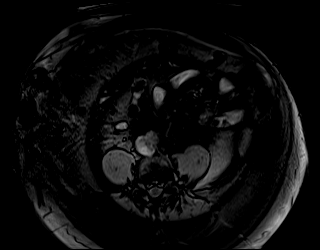
[im 32/64]
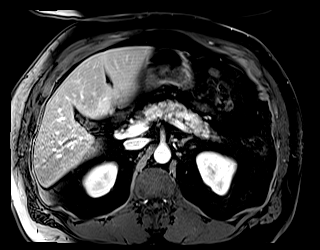
[im 64/64]
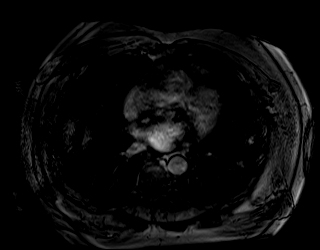

[Series 20: axial dynamic post · axial · 4.0mm · 1.31mm/px · z∈[-120,+132]mm · 3 of 64 slices shown (6 of 6)]
[im 1/64]
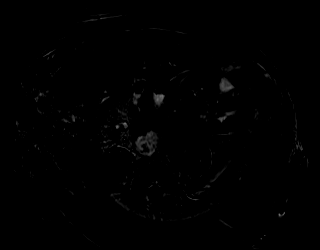
[im 32/64]
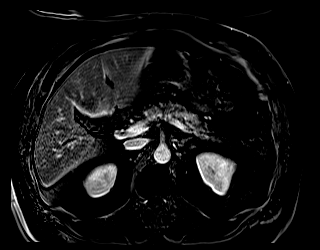
[im 64/64]
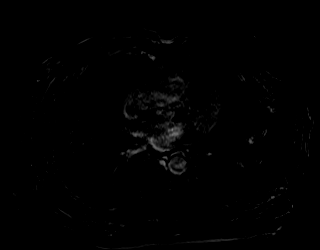

[Series 23: axial dynamic delayed · axial · 4.0mm · 1.31mm/px · z∈[-120,+132]mm · 3 of 64 slices shown]
[im 1/64]
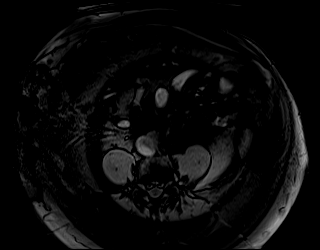
[im 32/64]
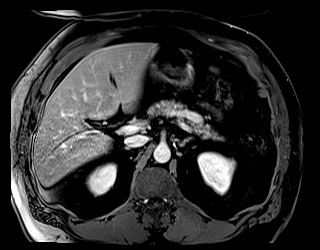
[im 64/64]
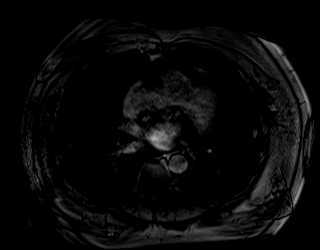

[Series 24: axial dynamic delayed_sub · axial · 4.0mm · 1.31mm/px · z∈[-120,+132]mm · 3 of 64 slices shown]
[im 1/64]
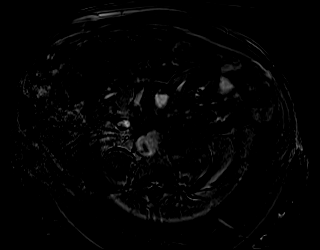
[im 32/64]
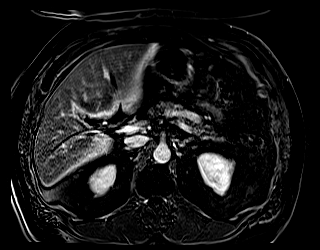
[im 64/64]
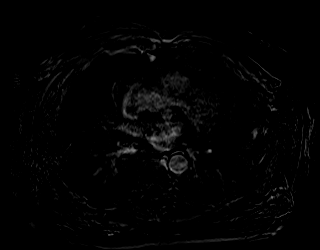

[43 of 48 positions shown; findings below may reference images not displayed]

FINDINGS: Lower chest: No acute findings.

Hepatobiliary: No hepatic masses identified. Mild diffuse hepatic
steatosis. Prior cholecystectomy. No evidence of biliary ductal
dilatation common bile duct measuring approximately 4 mm. No
evidence of choledocholithiasis.

Pancreas: No mass or inflammatory changes. No evidence of pancreatic
ductal dilatation or pancreas divisum.

Spleen:  Within normal limits in size and appearance.

Adrenals/Urinary Tract: No masses identified. No evidence of
hydronephrosis.

Stomach/Bowel: Visualized abdominal bowel unremarkable.

Vascular/Lymphatic: No pathologically enlarged lymph nodes
identified. No abdominal aortic aneurysm.

Other:  None.

Musculoskeletal:  No suspicious bone lesions identified.
IMPRESSION: Prior cholecystectomy. No evidence of biliary dilatation,
choledocholithiasis, or other acute findings.

Mild hepatic steatosis.

## 2018-11-11 ENCOUNTER — Encounter: Payer: Self-pay | Admitting: Gastroenterology

## 2018-11-15 ENCOUNTER — Ambulatory Visit (AMBULATORY_SURGERY_CENTER): Payer: Self-pay | Admitting: *Deleted

## 2018-11-15 ENCOUNTER — Encounter: Payer: Self-pay | Admitting: Gastroenterology

## 2018-11-15 VITALS — Ht 71.0 in | Wt 260.0 lb

## 2018-11-15 DIAGNOSIS — Z800 Family history of malignant neoplasm of digestive organs: Secondary | ICD-10-CM

## 2018-11-15 MED ORDER — PEG 3350-KCL-NA BICARB-NACL 420 G PO SOLR: 4000.0000 mL | Freq: Once | ORAL | 0 refills | Status: AC

## 2018-11-15 NOTE — Progress Notes (Signed)
No egg or soy allergy known to patient issues with past sedation with any surgeries  or procedures of PONV , no intubation problems  No diet pills per patient No home 02 use per patient  No blood thinners per patient  Pt denies issues with constipation  No A fib or A flutter  EMMI video sent to pt's e mail -

## 2018-11-20 LAB — TESTOSTERONE: Testosterone: 606.5

## 2018-11-20 LAB — PSA: PSA: 1.09

## 2018-11-22 ENCOUNTER — Encounter: Payer: Self-pay | Admitting: Gastroenterology

## 2018-11-22 ENCOUNTER — Ambulatory Visit (AMBULATORY_SURGERY_CENTER): Payer: BLUE CROSS/BLUE SHIELD | Admitting: Gastroenterology

## 2018-11-22 VITALS — BP 123/80 | HR 72 | Temp 98.4°F | Resp 12 | Ht 71.0 in | Wt 260.0 lb

## 2018-11-22 DIAGNOSIS — D124 Benign neoplasm of descending colon: Secondary | ICD-10-CM

## 2018-11-22 DIAGNOSIS — D122 Benign neoplasm of ascending colon: Secondary | ICD-10-CM

## 2018-11-22 DIAGNOSIS — D125 Benign neoplasm of sigmoid colon: Secondary | ICD-10-CM

## 2018-11-22 DIAGNOSIS — Z800 Family history of malignant neoplasm of digestive organs: Secondary | ICD-10-CM

## 2018-11-22 DIAGNOSIS — Z1211 Encounter for screening for malignant neoplasm of colon: Secondary | ICD-10-CM | POA: Diagnosis not present

## 2018-11-22 MED ORDER — SODIUM CHLORIDE 0.9 % IV SOLN: 500.0000 mL | Freq: Once | INTRAVENOUS | Status: DC

## 2018-11-22 NOTE — Patient Instructions (Signed)
YOU HAD AN ENDOSCOPIC PROCEDURE TODAY AT THE [facility]:   Refer to the procedure report that was given to you for any specific questions about what was found during the examination.  If the procedure report does not answer your questions, please call your gastroenterologist to clarify.  If you requested that your care partner not be given the details of your procedure findings, then the procedure report has been included in a sealed envelope for you to review at your convenience later.  YOU SHOULD EXPECT: Some feelings of bloating in the abdomen. Passage of more gas than usual.  Walking can help get rid of the air that was put into your GI tract during the procedure and reduce the bloating. If you had a lower endoscopy (such as a colonoscopy or flexible sigmoidoscopy) you may notice spotting of blood in your stool or on the toilet paper. If you underwent a bowel prep for your procedure, you may not have a normal bowel movement for a few days.  Please Note:  You might notice some irritation and congestion in your nose or some drainage.  This is from the oxygen used during your procedure.  There is no need for concern and it should clear up in a day or so.  SYMPTOMS TO REPORT IMMEDIATELY:   Following lower endoscopy (colonoscopy or flexible sigmoidoscopy):  Excessive amounts of blood in the stool  Significant tenderness or worsening of abdominal pains  Swelling of the abdomen that is new, acute  Fever of 100F or higher   For urgent or emergent issues, a gastroenterologist can be reached at any hour by calling [phone].   DIET:  We do recommend a small meal at first, but then you may proceed to your regular diet.  Drink plenty of fluids but you should avoid alcoholic beverages for 24 hours.  ACTIVITY:  You should plan to take it easy for the rest of today and you should NOT DRIVE or use heavy machinery until tomorrow (because of the sedation medicines used during the test).     FOLLOW UP: Our staff will call the number listed on your records the next business day following your procedure to check on you and address any questions or concerns that you may have regarding the information given to you following your procedure. If we do not reach you, we will leave a message.  However, if you are feeling well and you are not experiencing any problems, there is no need to return our call.  We will assume that you have returned to your regular daily activities without incident.  If any biopsies were taken you will be contacted by phone or by letter within the next 1-3 weeks.  Please call us at [phone] if you have not heard about the biopsies in 3 weeks.    SIGNATURES/CONFIDENTIALITY: You and/or your care partner have signed paperwork which will be entered into your electronic medical record.  These signatures attest to the fact that that the information above on your After Visit Summary has been reviewed and is understood.  Full responsibility of the confidentiality of this discharge information lies with you and/or your care-partner.  Polyp information given. 

## 2018-11-22 NOTE — Progress Notes (Signed)
Called to room to assist during endoscopic procedure.  Patient ID and intended procedure confirmed with present staff. Received instructions for my participation in the procedure from the performing physician.  

## 2018-11-22 NOTE — Progress Notes (Signed)
PT taken to PACU. Monitors in place. VSS. Report given to RN. 

## 2018-11-22 NOTE — Op Note (Signed)
[facility] Patient Name: Jordan Berg Procedure Date: 11/22/2018 3:40 PM MRN: [id number] Endoscopist: Milus Banister , MD Age: 62 Referring MD:  Date of Birth: November 06, 1956 Gender: Male Account #: [id number] Procedure:                Colonoscopy Indications:              Screening for colorectal malignant neoplasm Medicines:                Monitored Anesthesia Care Procedure:                Pre-Anesthesia Assessment:                           - Prior to the procedure, a History and Physical                            was performed, and patient medications and                            allergies were reviewed. The patient's tolerance of                            previous anesthesia was also reviewed. The risks                            and benefits of the procedure and the sedation                            options and risks were discussed with the patient.                            All questions were answered, and informed consent                            was obtained. Prior Anticoagulants: The patient has                            taken no previous anticoagulant or antiplatelet                            agents. ASA Grade Assessment: II - A patient with                            mild systemic disease. After reviewing the risks                            and benefits, the patient was deemed in                            satisfactory condition to undergo the procedure.                           After obtaining informed consent, the colonoscope  was passed under direct vision. Throughout the                            procedure, the patient's blood pressure, pulse, and                            oxygen saturations were monitored continuously. The                            Colonoscope was introduced through the anus and                            advanced to the the cecum, identified by                            appendiceal orifice and  ileocecal valve. The                            colonoscopy was performed without difficulty. The                            patient tolerated the procedure well. The quality                            of the bowel preparation was good. The ileocecal                            valve, appendiceal orifice, and rectum were                            photographed. Scope In: 3:47:55 PM Scope Out: 4:16:23 PM Scope Withdrawal Time: 0 hours 23 minutes 35 seconds  Total Procedure Duration: 0 hours 28 minutes 28 seconds  Findings:                 Three sessile polyps were found in the sigmoid                            colon, descending colon and ascending colon. The                            polyps were 2 to 4 mm in size. These polyps were                            removed with a cold snare. Resection and retrieval                            were complete. jar 1                           A 17 mm polyp was found in the ascending colon. The                            polyp was sessile. The polyp was  removed with a                            piecemeal using mixed snare/cautery and cold snare                            techniques. Resection and retrieval were complete.                            jar 2                           The exam was otherwise without abnormality on                            direct and retroflexion views. Complications:            No immediate complications. Estimated blood loss:                            None. Estimated Blood Loss:     Estimated blood loss: none. Impression:               - Three 2 to 4 mm polyps in the sigmoid colon, in                            the descending colon and in the ascending colon,                            removed with a cold snare. Resected and retrieved.                           - One 17 mm polyp in the ascending colon, removed                            piecemeal using a hot snare. Resected and retrieved.                           -  The examination was otherwise normal on direct                            and retroflexion views. Recommendation:           - Patient has a contact number available for                            emergencies. The signs and symptoms of potential                            delayed complications were discussed with the                            patient. Return to normal activities tomorrow.                            Written  discharge instructions were provided to the                            patient.                           - Resume previous diet.                           - Continue present medications.                           - Repeat colonoscopy is recommended. The                            colonoscopy date will be determined after pathology                            results from today's exam become available for                            review. Likely you will need repeat colonoscopy in                            6 months given the piecemeal resection requried to                            remove the larger ascending colon polyp. Milus Banister, MD 11/22/2018 4:22:09 PM This report has been signed electronically.

## 2018-11-24 ENCOUNTER — Observation Stay (HOSPITAL_COMMUNITY)
Admission: EM | Admit: 2018-11-24 | Discharge: 2018-11-26 | Disposition: A | Discharge status: Home / Self Care | Payer: BLUE CROSS/BLUE SHIELD | Attending: Internal Medicine | Admitting: Internal Medicine

## 2018-11-24 ENCOUNTER — Other Ambulatory Visit: Payer: Self-pay

## 2018-11-24 ENCOUNTER — Emergency Department (EMERGENCY_DEPARTMENT_HOSPITAL)
Admission: EM | Admit: 2018-11-24 | Discharge: 2018-11-24 | Disposition: A | Discharge status: Home / Self Care | Payer: BLUE CROSS/BLUE SHIELD | Source: Home / Self Care | Attending: Emergency Medicine | Admitting: Emergency Medicine

## 2018-11-24 ENCOUNTER — Encounter (HOSPITAL_COMMUNITY): Payer: Self-pay

## 2018-11-24 ENCOUNTER — Telehealth: Payer: Self-pay | Admitting: Gastroenterology

## 2018-11-24 ENCOUNTER — Encounter (HOSPITAL_COMMUNITY): Payer: Self-pay | Admitting: Emergency Medicine

## 2018-11-24 DIAGNOSIS — Z881 Allergy status to other antibiotic agents status: Secondary | ICD-10-CM | POA: Diagnosis not present

## 2018-11-24 DIAGNOSIS — Z860100 Personal history of colon polyps, unspecified: Secondary | ICD-10-CM

## 2018-11-24 DIAGNOSIS — K921 Melena: Secondary | ICD-10-CM | POA: Diagnosis not present

## 2018-11-24 DIAGNOSIS — Z6835 Body mass index (BMI) 35.0-35.9, adult: Secondary | ICD-10-CM | POA: Diagnosis not present

## 2018-11-24 DIAGNOSIS — K633 Ulcer of intestine: Secondary | ICD-10-CM | POA: Diagnosis not present

## 2018-11-24 DIAGNOSIS — Z87891 Personal history of nicotine dependence: Secondary | ICD-10-CM | POA: Insufficient documentation

## 2018-11-24 DIAGNOSIS — E669 Obesity, unspecified: Secondary | ICD-10-CM | POA: Diagnosis not present

## 2018-11-24 DIAGNOSIS — K625 Hemorrhage of anus and rectum: Secondary | ICD-10-CM

## 2018-11-24 DIAGNOSIS — K91840 Postprocedural hemorrhage and hematoma of a digestive system organ or structure following a digestive system procedure: Principal | ICD-10-CM | POA: Insufficient documentation

## 2018-11-24 DIAGNOSIS — E7800 Pure hypercholesterolemia, unspecified: Secondary | ICD-10-CM | POA: Diagnosis not present

## 2018-11-24 DIAGNOSIS — K922 Gastrointestinal hemorrhage, unspecified: Secondary | ICD-10-CM | POA: Diagnosis present

## 2018-11-24 DIAGNOSIS — K219 Gastro-esophageal reflux disease without esophagitis: Secondary | ICD-10-CM | POA: Diagnosis not present

## 2018-11-24 DIAGNOSIS — Z79899 Other long term (current) drug therapy: Secondary | ICD-10-CM | POA: Insufficient documentation

## 2018-11-24 DIAGNOSIS — I10 Essential (primary) hypertension: Secondary | ICD-10-CM | POA: Diagnosis not present

## 2018-11-24 LAB — CBC
HCT: 46.9 % (ref 39.0–52.0)
HEMATOCRIT: 47.9 % (ref 39.0–52.0)
HEMOGLOBIN: 15.7 g/dL (ref 13.0–17.0)
Hemoglobin: 15.3 g/dL (ref 13.0–17.0)
MCH: 32 pg (ref 26.0–34.0)
MCH: 31.5 pg (ref 26.0–34.0)
MCHC: 32.8 g/dL (ref 30.0–36.0)
MCHC: 32.6 g/dL (ref 30.0–36.0)
MCV: 97.6 fL (ref 80.0–100.0)
MCV: 96.7 fL (ref 80.0–100.0)
Platelets: 182 10*3/uL (ref 150–400)
Platelets: 200 10*3/uL (ref 150–400)
RBC: 4.91 MIL/uL (ref 4.22–5.81)
RBC: 4.85 MIL/uL (ref 4.22–5.81)
RDW: 13.3 % (ref 11.5–15.5)
RDW: 13.2 % (ref 11.5–15.5)
WBC: 5.9 10*3/uL (ref 4.0–10.5)
WBC: 6.7 10*3/uL (ref 4.0–10.5)
nRBC: 0 % (ref 0.0–0.2)
nRBC: 0 % (ref 0.0–0.2)

## 2018-11-24 LAB — COMPREHENSIVE METABOLIC PANEL
ALT: 28 U/L (ref 0–44)
AST: 23 U/L (ref 15–41)
Albumin: 4.1 g/dL (ref 3.5–5.0)
Alkaline Phosphatase: 37 U/L — ABNORMAL LOW (ref 38–126)
Anion gap: 9 (ref 5–15)
BUN: 24 mg/dL — ABNORMAL HIGH (ref 8–23)
CO2: 26 mmol/L (ref 22–32)
Calcium: 8.9 mg/dL (ref 8.9–10.3)
Chloride: 104 mmol/L (ref 98–111)
Creatinine, Ser: 1.29 mg/dL — ABNORMAL HIGH (ref 0.61–1.24)
GFR calc Af Amer: >60 mL/min (ref >60)
GFR calc non Af Amer: 59 mL/min — ABNORMAL LOW (ref >60)
Glucose, Bld: 99 mg/dL (ref 70–99)
Potassium: 4.2 mmol/L (ref 3.5–5.1)
Sodium: 139 mmol/L (ref 135–145)
TOTAL PROTEIN: 6.7 g/dL (ref 6.5–8.1)
Total Bilirubin: 1.2 mg/dL (ref 0.3–1.2)

## 2018-11-24 LAB — TYPE AND SCREEN
ABO/RH(D): A POS
ANTIBODY SCREEN: NEGATIVE

## 2018-11-24 LAB — HEMOGLOBIN AND HEMATOCRIT, BLOOD
HCT: 48.5 % (ref 39.0–52.0)
Hemoglobin: 15.7 g/dL (ref 13.0–17.0)

## 2018-11-24 MED ORDER — ONDANSETRON HCL 4 MG PO TABS: 4.0000 mg | ORAL_TABLET | Freq: Four times a day (QID) | ORAL | Status: DC | PRN

## 2018-11-24 MED ORDER — ACETAMINOPHEN 650 MG RE SUPP: 650.0000 mg | Freq: Four times a day (QID) | RECTAL | Status: DC | PRN

## 2018-11-24 MED ORDER — ONDANSETRON HCL 4 MG/2ML IJ SOLN: 4.0000 mg | Freq: Four times a day (QID) | INTRAMUSCULAR | Status: DC | PRN

## 2018-11-24 MED ORDER — SODIUM CHLORIDE 0.9% FLUSH
3.0000 mL | Freq: Two times a day (BID) | INTRAVENOUS | Status: DC
  Administered 2018-11-24 – 2018-11-25 (×2): 3 mL via INTRAVENOUS

## 2018-11-24 MED ORDER — SODIUM CHLORIDE 0.9 % IV SOLN: 250.0000 mL | INTRAVENOUS | Status: DC | PRN

## 2018-11-24 MED ORDER — SODIUM CHLORIDE 0.9% FLUSH: 3.0000 mL | INTRAVENOUS | Status: DC | PRN

## 2018-11-24 MED ORDER — ACETAMINOPHEN 325 MG PO TABS: 650.0000 mg | ORAL_TABLET | Freq: Four times a day (QID) | ORAL | Status: DC | PRN

## 2018-11-24 MED ORDER — POLYETHYLENE GLYCOL 3350 17 GM/SCOOP PO POWD
1.0000 | Freq: Once | ORAL | Status: DC
  Filled 2018-11-24: qty 255

## 2018-11-24 NOTE — Discharge Instructions (Signed)
Your rectal bleeding is likely due to irritation from recent colonoscopy.  Please follow up with GI specialist as scheduled.  Return to the ER if your bleeding returns or if you have other concerns.

## 2018-11-24 NOTE — H&P (Signed)
History and Physical    Jordan Berg FXT:[id number] DOB: 09-03-1956 DOA: 11/24/2018  PCP: Tammi Sou, MD   Patient coming from: Home   Chief Complaint: Lower GI bleeding  HPI: Jordan Berg is a 62 y.o. male with medical history significant of colonic polyps, nephrolithiasis, testosterone deficiency who comes in with lower GI bleeding after colonoscopy.  Patient did have a screening colonoscopy on 11/22/2018 that showed several small polyps and one very large 17 mm polyp that had to be moved piecemeal.  Per review of the GI procedure note the recommendation was likely to have another colonoscopy in approximately 6 months pending pathology.  Yesterday patient began to notice old blood mixed in with stool as well as some bright red blood per rectum.  This morning he continued to have bright red blood per rectum as well as all clots and presented to the ED at approximately 9 AM.  Of note he appeared to primarily be passing old blood with less bright red blood per rectum.  Patient's GI doctor (Dr. Lance Sell) was consulted and recommended outpatient follow-up as patient's bleeding was resolving and his hemoglobin in the ED was stable.  Dr. Loletha Carrow also called and recommended a MiraLAX prep with anticipation for another colonoscopy.  Patient reported that he started the prep and began to pass old blood and then began to pass nothing but fresh blood.  He continued to have only fresh blood per rectum.  He again called Dr. Simona Huh who recommended presenting to the emergency department at [facility] for admission.  Currently the patient reports only some right-sided abdominal pain that is very vague.  It is nonradiating.  It feels prior to his similar biliary colic however he did have a cholecystectomy.  He denies any nausea, vomiting, diarrhea, chills, syncope/presyncope, orthostasis.  He denies any fevers, dysuria.  ED Course: In the emergency department patient's vitals were stable.  His  hemoglobin on recheck was still normal.  Patient was admitted for likely colonoscopy.  Review of Systems: As per HPI otherwise 10 point review of systems negative.    Past Medical History:  Diagnosis Date  . Arthritis    mild  . Cataract    forming  . Colon polyps 01/20/2009   NON-adenomatous-recall 10 yrs  . GERD (gastroesophageal reflux disease)    mildly  . H. pylori infection   . Helicobacter pylori gastritis 02/2018   EGD+.  Stool antigen NEG after abx treatment.  . Hepatic steatosis    Noted on MRI abd 12/2017 (done for RUQ pain)  . Hypercholesterolemia 12/2017   Recommended statin 12/2017: pt chose TLC and repeat chol 3 mo.  . Hypogonadism male    Managed by pt's urologist as of 12/2017 (including biannual DREs and PSAs).  . Kidney stone   . Personal history of colonic polyps 11/24/2018  . RUQ abdominal pain Dec 2018;Jan 2019   Labs nl, abd plain films nl, MRCP nl.  GI did EUS 2019, normal except chronic gastritis, + H pylori.  Stool antigen test NEG after abx tx.    Past Surgical History:  Procedure Laterality Date  . CHOLECYSTECTOMY  2010  . COLONOSCOPY    . COLONOSCOPY W/ POLYPECTOMY  01/20/2009   Multiple nonadenomatous polyps.  Recall 10 yrs.  . ESOPHAGOGASTRODUODENOSCOPY  02/14/2018   +EUS: Chronic active gastritis, + H PYLORI, tx'd with abx by GI, stool antigen test NEG after abx (04/2018)  . EUS N/A 02/14/2018   Procedure: UPPER ENDOSCOPIC ULTRASOUND (EUS)  LINEAR;  Surgeon: Milus Banister, MD;  Location: [facility];  Service: Endoscopy;  Laterality: N/A;  . KNEE SURGERY Left 2000 and 2017   arthroscopic  . LUMBAR SPINE SURGERY  2000   No hardware  . NASAL SEPTUM SURGERY  1981  . POLYPECTOMY    . TONSILLECTOMY  1978  . UPPER GASTROINTESTINAL ENDOSCOPY       reports that he quit smoking about 41 years ago. His smoking use included cigarettes. He has a 4.50 pack-year smoking history. He has never used smokeless tobacco. He reports that he does not drink  alcohol or use drugs.  Allergies  Allergen Reactions  . Levofloxacin Itching and Other (See Comments)    [place]    Family History  Problem Relation Age of Onset  . Arthritis Mother   . Depression Mother   . Alzheimer's disease Mother   . Alcohol abuse Father   . Arthritis Father   . COPD Father   . Hearing loss Father   . Heart disease Father   . Colon cancer Sister 32  . Colon cancer Paternal Aunt   . Colon cancer Cousin   . Colon polyps Neg Hx   . Esophageal cancer Neg Hx   . Rectal cancer Neg Hx   . Stomach cancer Neg Hx      Prior to Admission medications   Medication Sig Start Date End Date Taking? Authorizing Provider  Alcaftadine (LASTACAFT) 0.25 % SOLN Apply to eye.    [provider]  clobetasol cream (TEMOVATE) 0.05 % Apply to affected area twice daily as needed for dry skin 11/06/17   [provider]  co-enzyme Q-10 30 MG capsule Take 30 mg by mouth daily. Liquid    [provider]  ibuprofen (ADVIL,MOTRIN) 200 MG tablet Take 400 mg by mouth every 6 (six) hours as needed for headache or moderate pain.    [provider]  Misc Natural Products (BLACK CHERRY CONCENTRATE PO) Take by mouth.    [provider]  Misc Natural Products (PROSTATE HEALTH PO) Take 1 capsule by mouth daily.    [provider]  Omega-3 Fatty Acids (FISH OIL) 1000 MG CAPS Take by mouth.    [provider]  OVER THE COUNTER MEDICATION daily. BioAstin Hawaiian Astaxanthin    [provider]  Salicylic Acid 3 % OINT Apply 1 application topically daily as needed (for remove dry, dead skin).     [provider]  testosterone cypionate (DEPOTESTOSTERONE CYPIONATE) 200 MG/ML injection Inject 60 mg into the muscle every Thursday.  11/02/17   [provider]  TURMERIC PO Take by mouth daily. 15 ml daily    [provider]    Physical Exam: Vitals:   11/24/18 1540  BP: (!) 124/93  Pulse: 94  Resp: 18    Temp: (!) 97.5 F (36.4 C)  TempSrc: Oral  SpO2: 96%    Constitutional: NAD, calm, comfortable Vitals:   11/24/18 1540  BP: (!) 124/93  Pulse: 94  Resp: 18  Temp: (!) 97.5 F (36.4 C)  TempSrc: Oral  SpO2: 96%   Eyes: Anicteric sclera ENMT: Moist mucous membranes, good dentition Neck: normal, supple, no masses, no thyromegaly Respiratory: clear to auscultation bilaterally, no wheezing, no crackles. Normal respiratory effort. No accessory muscle use.  Cardiovascular: Regular rate and rhythm, no murmurs.  Abdomen: no tenderness, no masses palpated. No hepatosplenomegaly. Bowel sounds positive.  Musculoskeletal: No lower extremity edema Skin: no rashes over visible skin Neurologic: Grossly  intact, moving all extremities Psychiatric: Normal judgment and insight. Alert and oriented x 3. Normal mood.    Labs on Admission: I have personally reviewed following labs and imaging studies  CBC: Recent Labs  Lab 11/24/18 0603 11/24/18 1624  WBC 5.9 6.7  HGB 15.7 15.3  HCT 47.9 46.9  MCV 97.6 96.7  PLT 182 801   Basic Metabolic Panel: Recent Labs  Lab 11/24/18 0603  NA 139  K 4.2  CL 104  CO2 26  GLUCOSE 99  BUN 24*  CREATININE 1.29*  CALCIUM 8.9   GFR: Estimated Creatinine Clearance: 76.3 mL/min (A) (by C-G formula based on SCr of 1.29 mg/dL (H)). Liver Function Tests: Recent Labs  Lab 11/24/18 0603  AST 23  ALT 28  ALKPHOS 37*  BILITOT 1.2  PROT 6.7  ALBUMIN 4.1   No results for input(s): LIPASE, AMYLASE in the last 168 hours. No results for input(s): AMMONIA in the last 168 hours. Coagulation Profile: No results for input(s): INR, PROTIME in the last 168 hours. Cardiac Enzymes: No results for input(s): CKTOTAL, CKMB, CKMBINDEX, TROPONINI in the last 168 hours. BNP (last 3 results) No results for input(s): PROBNP in the last 8760 hours. HbA1C: No results for input(s): HGBA1C in the last 72 hours. CBG: No results for input(s): GLUCAP in the last 168  hours. Lipid Profile: No results for input(s): CHOL, HDL, LDLCALC, TRIG, CHOLHDL, LDLDIRECT in the last 72 hours. Thyroid Function Tests: No results for input(s): TSH, T4TOTAL, FREET4, T3FREE, THYROIDAB in the last 72 hours. Anemia Panel: No results for input(s): VITAMINB12, FOLATE, FERRITIN, TIBC, IRON, RETICCTPCT in the last 72 hours. Urine analysis:    Component Value Date/Time   BILIRUBINUR Negative 12/14/2017 1148   KETONESUR negative 03/01/2016 1526   PROTEINUR Negative 12/14/2017 1148   UROBILINOGEN 0.2 12/14/2017 1148   NITRITE Negative 12/14/2017 1148   LEUKOCYTESUR Negative 12/14/2017 1148    Radiological Exams on Admission: No results found.  EKG: Independently reviewed.  None performed  Assessment/Plan Principal Problem:   Acute lower GI bleeding Active Problems:   Hypercholesterolemia   Personal history of colonic polyps    #) Lower GI bleeding: Most likely secondary to post polypectomy bleeding.  Likely from large bleeding patient is already completed MiraLAX prep and is having clear stools except for blood. -N.p.o. at midnight, clears until then -GI consult, [facility] -Every 6 hours H&H check for 1 day  #) Various supplements: Patient is on various supplements and testosterone supplementation.  He does not need these at this time.  These would not be ordered.   Fluids: Tolerating p.o. Electrolytes: Monitor and supplement Nutrition: Per above  Prophylaxis: SCDs Extent disposition: Pending colonoscopy  Full code   Cristy Folks MD [facility]  If 7PM-7AM, please contact night-coverage [email or web address] Password [facility]  11/24/2018, 5:19 PM

## 2018-11-24 NOTE — ED Notes (Signed)
Patient reports having 4 polyps removed with colonoscopy on Friday 12/13.

## 2018-11-24 NOTE — ED Provider Notes (Signed)
[facility] DEPT Provider Note   CSN: [id number] Arrival date & time: 11/24/18  1093     History   Chief Complaint Chief Complaint  Patient presents with  . Rectal Bleeding    HPI Chanel Mckesson is a 62 y.o. male.  The history is provided by the patient and medical records. No language interpreter was used.  Rectal Bleeding     62 year old male with history of hepatic steatosis, GERD, who recently undergoing a colonoscopy on 11/22/2018 presenting today for evaluation of rectal bleeding.  Patient report he had several polyps removed during the colonoscopy.  He mentioned there was a large polyp that was also been removed.  The procedure went well, no complication, he has been doing fine until last night he had a bowel movement and noticed small amount of stools but large amount of bright red blood mixed with dark blood.  There was no associated pain, this morning he had another bowel movement and noticed blood per rectum but less compared to the one previously.  No report of lightheadedness, dizziness, chest pain, abdominal pain or rectal pain.  He is not on any blood thinner medication.  He denies any recent trauma.  His GI specialist is Dr. Edison Nasuti.  Past Medical History:  Diagnosis Date  . Arthritis    mild  . Cataract    forming  . Colon polyps 01/20/2009   NON-adenomatous-recall 10 yrs  . GERD (gastroesophageal reflux disease)    mildly  . H. pylori infection   . Helicobacter pylori gastritis 02/2018   EGD+.  Stool antigen NEG after abx treatment.  . Hepatic steatosis    Noted on MRI abd 12/2017 (done for RUQ pain)  . Hypercholesterolemia 12/2017   Recommended statin 12/2017: pt chose TLC and repeat chol 3 mo.  . Hypogonadism male    Managed by pt's urologist as of 12/2017 (including biannual DREs and PSAs).  . Kidney stone   . RUQ abdominal pain Dec 2018;Jan 2019   Labs nl, abd plain films nl, MRCP nl.  GI did EUS 2019, normal except  chronic gastritis, + H pylori.  Stool antigen test NEG after abx tx.    Patient Active Problem List   Diagnosis Date Noted  . Gastritis and gastroduodenitis   . Hypercholesterolemia 12/11/2017  . CHOLELITHIASIS 12/28/2008  . RUQ pain 12/25/2008  . ABDOMINAL PAIN 12/24/2008    Past Surgical History:  Procedure Laterality Date  . CHOLECYSTECTOMY  2010  . COLONOSCOPY    . COLONOSCOPY W/ POLYPECTOMY  01/20/2009   Multiple nonadenomatous polyps.  Recall 10 yrs.  . ESOPHAGOGASTRODUODENOSCOPY  02/14/2018   +EUS: Chronic active gastritis, + H PYLORI, tx'd with abx by GI, stool antigen test NEG after abx (04/2018)  . EUS N/A 02/14/2018   Procedure: UPPER ENDOSCOPIC ULTRASOUND (EUS) LINEAR;  Surgeon: Milus Banister, MD;  Location: [facility];  Service: Endoscopy;  Laterality: N/A;  . KNEE SURGERY Left 2000 and 2017   arthroscopic  . LUMBAR SPINE SURGERY  2000   No hardware  . NASAL SEPTUM SURGERY  1981  . POLYPECTOMY    . TONSILLECTOMY  1978  . UPPER GASTROINTESTINAL ENDOSCOPY          Home Medications    Prior to Admission medications   Medication Sig Start Date End Date Taking? Authorizing Provider  Alcaftadine (LASTACAFT) 0.25 % SOLN Apply to eye.    [provider]  clobetasol cream (TEMOVATE) 0.05 % Apply to affected area twice  daily as needed for dry skin 11/06/17   [provider]  co-enzyme Q-10 30 MG capsule Take 30 mg by mouth daily. Liquid    [provider]  ibuprofen (ADVIL,MOTRIN) 200 MG tablet Take 400 mg by mouth every 6 (six) hours as needed for headache or moderate pain.    [provider]  Misc Natural Products (BLACK CHERRY CONCENTRATE PO) Take by mouth.    [provider]  Misc Natural Products (PROSTATE HEALTH PO) Take 1 capsule by mouth daily.    [provider]  Omega-3 Fatty Acids (FISH OIL) 1000 MG CAPS Take by mouth.    [provider]  OVER THE COUNTER MEDICATION daily. BioAstin [place]  Astaxanthin    [provider]  Salicylic Acid 3 % OINT Apply 1 application topically daily as needed (for remove dry, dead skin).     [provider]  testosterone cypionate (DEPOTESTOSTERONE CYPIONATE) 200 MG/ML injection Inject 60 mg into the muscle every Thursday.  11/02/17   [provider]  TURMERIC PO Take by mouth daily. 15 ml daily    [provider]    Family History Family History  Problem Relation Age of Onset  . Arthritis Mother   . Depression Mother   . Alzheimer's disease Mother   . Alcohol abuse Father   . Arthritis Father   . COPD Father   . Hearing loss Father   . Heart disease Father   . Colon cancer Sister 60  . Colon cancer Paternal Aunt   . Colon cancer Cousin   . Colon polyps Neg Hx   . Esophageal cancer Neg Hx   . Rectal cancer Neg Hx   . Stomach cancer Neg Hx     Social History Social History   Tobacco Use  . Smoking status: Former Smoker    Packs/day: 1.50    Years: 3.00    Pack years: 4.50    Types: Cigarettes    Last attempt to quit: 12/11/1976    Years since quitting: 41.9  . Smokeless tobacco: Never Used  Substance Use Topics  . Alcohol use: No  . Drug use: No     Allergies   Levofloxacin   Review of Systems Review of Systems  Gastrointestinal: Positive for hematochezia.  All other systems reviewed and are negative.    Physical Exam Updated Vital Signs BP 134/68   Pulse 68   Temp 97.6 F (36.4 C) (Oral)   Resp 15   Ht 5\' 11"  (1.803 m)   Wt 117 kg   SpO2 96%   BMI 35.98 kg/m   Physical Exam Vitals signs and nursing note reviewed.  Constitutional:      General: He is not in acute distress.    Appearance: He is well-developed.  HENT:     Head: Atraumatic.  Eyes:     Conjunctiva/sclera: Conjunctivae normal.  Neck:     Musculoskeletal: Neck supple.  Cardiovascular:     Rate and Rhythm: Normal rate and regular rhythm.     Pulses: Normal pulses.     Heart sounds: Normal heart  sounds.  Pulmonary:     Effort: Pulmonary effort is normal.     Breath sounds: Normal breath sounds.  Abdominal:     Palpations: Abdomen is soft.     Tenderness: There is no abdominal tenderness.  Genitourinary:    Comments: Deferred rectal exam. Skin:    Findings: No rash.  Neurological:     Mental Status: He is  alert and oriented to person, place, and time.  Psychiatric:        Mood and Affect: Mood normal.      ED Treatments / Results  Labs (all labs ordered are listed, but only abnormal results are displayed) Labs Reviewed  COMPREHENSIVE METABOLIC PANEL - Abnormal; Notable for the following components:      Result Value   BUN 24 (*)    Creatinine, Ser 1.29 (*)    Alkaline Phosphatase 37 (*)    GFR calc non Af Amer 59 (*)    All other components within normal limits  CBC  POC OCCULT BLOOD, ED  TYPE AND SCREEN  ABO/RH    EKG None  Radiology No results found.  Procedures Procedures (including critical care time)  Medications Ordered in ED Medications - No data to display   Initial Impression / Assessment and Plan / ED Course  I have reviewed the triage vital signs and the nursing notes.  Pertinent labs & imaging results that were available during my care of the patient were reviewed by me and considered in my medical decision making (see chart for details).     BP (!) 137/94   Pulse 74   Temp 97.6 F (36.4 C) (Oral)   Resp 15   Ht 5\' 11"  (1.803 m)   Wt 117 kg   SpO2 96%   BMI 35.98 kg/m    Final Clinical Impressions(s) / ED Diagnoses   Final diagnoses:  Rectal bleeding    ED Discharge Orders    None     6:28 AM Patient underwent colonoscopy several days prior presenting today with blood per rectum.  He denies any significant abdominal pain or rectal pain.  He did have several polyps removed during his colonoscopy which is likely contributing to his symptoms.  I have low suspicion for perforation causing his rectal bleeding.  Will check  CBC and will consult GI for further care.  7:44 AM Appreciate consultation from GI specialist, Dr. Loletha Carrow who would like to see patient in the ER and determine further evaluation and management.  Patient may benefit from observation.  9:24 AM GI specialist, Dr. Loletha Carrow has seen patient in the ED.  He felt that the bleeding is resolving and patient is stable to go home.  Patient will try a bowel prep concoction at home to "wash out the remaining blood product" and can follow-up outpatient for further care.  If bleeding returns, patient should return to the ED for further management.  Patient agrees with plan and felt comfortable going home.   Domenic Moras, PA-C 11/24/18 4142    Rolland Porter, MD 11/25/18 [phone]

## 2018-11-24 NOTE — Telephone Encounter (Signed)
Understood. Thanks for helping with him.

## 2018-11-24 NOTE — ED Triage Notes (Signed)
Pt reports having rectal bleeding that started last night around 1900. Pt reported to have colonoscopy on 11/22/18. Pt reported bright red blood in stool.

## 2018-11-24 NOTE — ED Provider Notes (Signed)
[facility] DEPT Provider Note   CSN: [id number] Arrival date & time: 11/24/18  1534     History   Chief Complaint Chief Complaint  Patient presents with  . Rectal Bleeding    HPI Jordan Berg is a 62 y.o. male.  The history is provided by the patient. No language interpreter was used.  Rectal Bleeding   Jordan Berg is a 62 y.o. male who presents to the Emergency Department complaining of hematochezia. He had a colonoscopy performed two days ago with polypectomy. He was doing well after the procedure. Yesterday he had a bloodied bowel movement. Early this morning he had a second bloodied bowel movement. He was evaluated in the emergency department and met with G.I. His plan was to go home and drink MiraLAX and clear liquids. Since that time he has had multiple bloodied bowel movements. Initially he was passing congealed, old appearing blood now he is passing bright red blood. He has had about six bowel movements since this morning. He had one bowel movement on ED arrival. He describes a warm feeling to his lower abdomen. He denies any fevers, chest pain, shortness of breath, weakness, malaise, nausea, vomiting. No prior similar symptoms. He does not take any blood thinners. Past Medical History:  Diagnosis Date  . Arthritis    mild  . Cataract    forming  . Colon polyps 01/20/2009   NON-adenomatous-recall 10 yrs  . GERD (gastroesophageal reflux disease)    mildly  . H. pylori infection   . Helicobacter pylori gastritis 02/2018   EGD+.  Stool antigen NEG after abx treatment.  . Hepatic steatosis    Noted on MRI abd 12/2017 (done for RUQ pain)  . Hypercholesterolemia 12/2017   Recommended statin 12/2017: pt chose TLC and repeat chol 3 mo.  . Hypogonadism male    Managed by pt's urologist as of 12/2017 (including biannual DREs and PSAs).  . Kidney stone   . Personal history of colonic polyps 11/24/2018  . RUQ abdominal pain Dec 2018;Jan  2019   Labs nl, abd plain films nl, MRCP nl.  GI did EUS 2019, normal except chronic gastritis, + H pylori.  Stool antigen test NEG after abx tx.    Patient Active Problem List   Diagnosis Date Noted  . Acute lower GI bleeding 11/24/2018  . Personal history of colonic polyps 11/24/2018  . GI bleeding 11/24/2018  . Gastritis and gastroduodenitis   . Hypercholesterolemia 12/11/2017  . CHOLELITHIASIS 12/28/2008  . RUQ pain 12/25/2008  . ABDOMINAL PAIN 12/24/2008    Past Surgical History:  Procedure Laterality Date  . CHOLECYSTECTOMY  2010  . COLONOSCOPY    . COLONOSCOPY W/ POLYPECTOMY  01/20/2009   Multiple nonadenomatous polyps.  Recall 10 yrs.  . ESOPHAGOGASTRODUODENOSCOPY  02/14/2018   +EUS: Chronic active gastritis, + H PYLORI, tx'd with abx by GI, stool antigen test NEG after abx (04/2018)  . EUS N/A 02/14/2018   Procedure: UPPER ENDOSCOPIC ULTRASOUND (EUS) LINEAR;  Surgeon: Milus Banister, MD;  Location: [facility];  Service: Endoscopy;  Laterality: N/A;  . KNEE SURGERY Left 2000 and 2017   arthroscopic  . LUMBAR SPINE SURGERY  2000   No hardware  . NASAL SEPTUM SURGERY  1981  . POLYPECTOMY    . TONSILLECTOMY  1978  . UPPER GASTROINTESTINAL ENDOSCOPY          Home Medications    Prior to Admission medications   Medication Sig Start Date End Date  Taking? Authorizing Provider  Alcaftadine (LASTACAFT) 0.25 % SOLN Apply 1 drop to eye daily.    Yes [provider]  carboxymethylcellulose (REFRESH PLUS) 0.5 % SOLN Place 1 drop into both eyes 3 (three) times daily as needed (dry eyes).   Yes [provider]  clobetasol cream (TEMOVATE) 1.74 % Apply 1 application topically daily as needed (dry skin).   Yes [provider]  Coenzyme Q10 (CO Q-10 PO) Take 10 mLs by mouth daily.   Yes [provider]  ibuprofen (ADVIL,MOTRIN) 200 MG tablet Take 400 mg by mouth every 6 (six) hours as needed for headache or moderate pain.   Yes [provider]  Misc Natural Products (BLACK CHERRY CONCENTRATE PO) Take 1 capsule by mouth daily.    Yes [provider]  Misc Natural Products (PROSTATE HEALTH PO) Take 1 capsule by mouth daily.   Yes [provider]  Omega-3 Fatty Acids (FISH OIL) 1000 MG CAPS Take 1 capsule by mouth daily.    Yes [provider]  OVER THE COUNTER MEDICATION Take 1 capsule by mouth daily. BioAstin Hawaiian Astaxanthin    Yes [provider]  Salicylic Acid 3 % OINT Apply 1 application topically daily as needed (for remove dry, dead skin).    Yes [provider]  TURMERIC PO Take 15 mLs by mouth daily. 15 ml daily    Yes [provider]  testosterone cypionate (DEPOTESTOSTERONE CYPIONATE) 200 MG/ML injection Inject 60 mg into the muscle every Thursday.  11/02/17   [provider]    Family History Family History  Problem Relation Age of Onset  . Arthritis Mother   . Depression Mother   . Alzheimer's disease Mother   . Alcohol abuse Father   . Arthritis Father   . COPD Father   . Hearing loss Father   . Heart disease Father   . Colon cancer Sister 66  . Colon cancer Paternal Aunt   . Colon cancer Cousin   . Colon polyps Neg Hx   . Esophageal cancer Neg Hx   . Rectal cancer Neg Hx   . Stomach cancer Neg Hx     Social History Social History   Tobacco Use  . Smoking status: Former Smoker    Packs/day: 1.50    Years: 3.00    Pack years: 4.50    Types: Cigarettes    Last attempt to quit: 12/11/1976    Years since quitting: 41.9  . Smokeless tobacco: Never Used  Substance Use Topics  . Alcohol use: No  . Drug use: No     Allergies   Levofloxacin   Review of Systems Review of Systems  Gastrointestinal: Positive for hematochezia.  All other systems reviewed and are negative.    Physical Exam Updated Vital Signs BP 129/90 (BP Location: Left Arm)   Pulse 71   Temp 97.6 F (36.4 C) (Oral)   Resp 18   SpO2 98%    Physical Exam Vitals signs and nursing note reviewed.  Constitutional:      Appearance: He is well-developed.  HENT:     Head: Normocephalic and atraumatic.  Cardiovascular:     Rate and Rhythm: Normal rate and regular rhythm.     Heart sounds: No murmur.  Pulmonary:     Effort: Pulmonary effort is normal. No respiratory distress.     Breath sounds: Normal breath sounds.  Abdominal:     Palpations: Abdomen is soft.     Tenderness: There  is no abdominal tenderness. There is no guarding or rebound.  Musculoskeletal:        General: No tenderness.  Skin:    General: Skin is warm and dry.  Neurological:     Mental Status: He is alert and oriented to person, place, and time.  Psychiatric:        Behavior: Behavior normal.      ED Treatments / Results  Labs (all labs ordered are listed, but only abnormal results are displayed) Labs Reviewed  CBC    EKG None  Radiology No results found.  Procedures Procedures (including critical care time)  Medications Ordered in ED Medications  polyethylene glycol powder (GLYCOLAX/MIRALAX) container 255 g (has no administration in time range)     Initial Impression / Assessment and Plan / ED Course  I have reviewed the triage vital signs and the nursing notes.  Pertinent labs & imaging results that were available during my care of the patient were reviewed by me and considered in my medical decision making (see chart for details).     Patient here for evaluation of hematochezia following polypectomy that occurred on Friday. He is in no acute distress on evaluation. He is hemodynamically stable. Hospitalist consulted for observation.   Final Clinical Impressions(s) / ED Diagnoses   Final diagnoses:  None    ED Discharge Orders    None       Quintella Reichert, MD 11/24/18 2053

## 2018-11-24 NOTE — ED Notes (Signed)
ED TO INPATIENT HANDOFF REPORT  Name/Age/Gender Jordan Berg 62 y.o. male  Code Status   Home/SNF/Other Home  Chief Complaint Complications from Colonoscopy-Bleeding   Level of Care/Admitting Diagnosis ED Disposition    ED Disposition Condition [place]: [facility] [100102]  Level of Care: Med-Surg [16]  Diagnosis: GI bleeding [732202]  Admitting Physician: Cristy Folks [[id number]]  Attending Physician: Cristy Folks [[id number]]  PT Class (Do Not Modify): Observation [104]  PT Acc Code (Do Not Modify): Observation [10022]       Medical History Past Medical History:  Diagnosis Date  . Arthritis    mild  . Cataract    forming  . Colon polyps 01/20/2009   NON-adenomatous-recall 10 yrs  . GERD (gastroesophageal reflux disease)    mildly  . H. pylori infection   . Helicobacter pylori gastritis 02/2018   EGD+.  Stool antigen NEG after abx treatment.  . Hepatic steatosis    Noted on MRI abd 12/2017 (done for RUQ pain)  . Hypercholesterolemia 12/2017   Recommended statin 12/2017: pt chose TLC and repeat chol 3 mo.  . Hypogonadism male    Managed by pt's urologist as of 12/2017 (including biannual DREs and PSAs).  . Kidney stone   . Personal history of colonic polyps 11/24/2018  . RUQ abdominal pain Dec 2018;Jan 2019   Labs nl, abd plain films nl, MRCP nl.  GI did EUS 2019, normal except chronic gastritis, + H pylori.  Stool antigen test NEG after abx tx.    Allergies Allergies  Allergen Reactions  . Levofloxacin Itching and Other (See Comments)    Whelps    IV Location/Drains/Wounds Patient Lines/Drains/Airways Status   Active Line/Drains/Airways    None          Labs/Imaging Results for orders placed or performed during the hospital encounter of 11/24/18 (from the past 48 hour(s))  CBC     Status: None   Collection Time: 11/24/18  4:24 PM  Result Value Ref Range   WBC 6.7 4.0 - 10.5 K/uL   RBC 4.85  4.22 - 5.81 MIL/uL   Hemoglobin 15.3 13.0 - 17.0 g/dL   HCT 46.9 39.0 - 52.0 %   MCV 96.7 80.0 - 100.0 fL   MCH 31.5 26.0 - 34.0 pg   MCHC 32.6 30.0 - 36.0 g/dL   RDW 13.2 11.5 - 15.5 %   Platelets 200 150 - 400 K/uL   nRBC 0.0 0.0 - 0.2 %    Comment: Performed at [facility], [place] [address], [place], [place] [address]   No results found. None  Pending Labs [employer] (From admission, onward)    Start     Ordered   Signed and Occupational hygienist morning,   R     Signed and Held   Signed and Held  CBC  Tomorrow morning,   R     Signed and Held   Signed and Held  Hemoglobin and hematocrit, blood  Now then every 6 hours,   R     Signed and Held          Vitals/Pain Today's Vitals   11/24/18 1623 11/24/18 1800 11/24/18 1806 11/24/18 1820  BP:   (!) 126/91 (!) 126/91  Pulse:    73  Resp:    15  Temp:      TempSrc:      SpO2:    98%  PainSc: 0-No pain 0-No pain      Isolation Precautions No active isolations  Medications Medications - No data to display  Mobility walks

## 2018-11-24 NOTE — Telephone Encounter (Signed)
New development - patient had some clearance of blood with prep at home, and then started passing a larger amount of fresh and clotted blood.  Called me about this at 2:45 PM, and I directed him back to the [facility] ED for admission.

## 2018-11-24 NOTE — Telephone Encounter (Signed)
Jordan Berg,    This patient came to ED Sunday AM with post-polypectomy bleeding that appeared to have stopped.  He went home, and will keep in touch with me around today about whether or not he has further bleeding.  Full plan as outlined in my ED consult note.  I asked him to have his wife bring him to the lab at 8:00 Monday morning for a stat CBC.  I believe I have placed this order correctly, but please make sure that I have.  I am sending this because it appears that Dr. Ardis Hughs is out of the office tomorrow.  Please look out for the CBC result in the morning will call patient to find out what he is doing, and pass this case along to [facility] doc of day in Dr. Audelia Acton absence.  If plans change between now and when I go off call it afternoon, I will make an addendum to this note.  - Dr. Loletha Carrow

## 2018-11-24 NOTE — ED Triage Notes (Signed)
Pt reports continued rectal bleeding post colonoscopy, he was seen for same earlier today and was instructed to come back if he continued bleeding. He followed instructions. A&Ox4. Ambulatory. Denies pain, but endorses a warm feeling in his lower abdomen.

## 2018-11-24 NOTE — Consult Note (Addendum)
[facility] Consult Note   History Jordan Berg MRN # [id number]  Date of Admission: 11/24/2018 Date of Consultation: 11/24/2018 Referring physician: Dr. Rolland Porter, MD Primary Care Provider: Tammi Sou, MD Primary Gastroenterologist: Dr. Owens Loffler   Reason for Consultation/Chief Complaint: Dr. Owens Loffler  Subjective  HPI:  This is a 62 year old man who underwent routine colonoscopy with Dr. Ardis Hughs 2 days ago, at which time several polyps were removed, including a 17 mm right colon polyp removed with snare cautery.  The patient was well afterwards and had normal bowel movements 3 yesterday until last evening, when he passed bright red and clotted blood about 7 PM.  He had some lower abdominal bloating but no pain, no vomiting, fever, chest pain or dyspnea.  He has not taken aspirin or anticoagulants.  He had another much smaller episode of bleeding at about 4 AM, and showed me a photograph of some tinged toilet water with a small amount of maroon clotted blood.  He called me about 430 this morning, and I directed him to the emergency department.  He has had no further episodes, he feels well and still has no abdominal pain.  His vital signs are normal as noted below.  ROS:  All other systems are negative except as noted above in the HPI  Past Medical History Past Medical History:  Diagnosis Date  . Arthritis    mild  . Cataract    forming  . Colon polyps 01/20/2009   NON-adenomatous-recall 10 yrs  . GERD (gastroesophageal reflux disease)    mildly  . H. pylori infection   . Helicobacter pylori gastritis 02/2018   EGD+.  Stool antigen NEG after abx treatment.  . Hepatic steatosis    Noted on MRI abd 12/2017 (done for RUQ pain)  . Hypercholesterolemia 12/2017   Recommended statin 12/2017: pt chose TLC and repeat chol 3 mo.  . Hypogonadism male    Managed by pt's urologist as of 12/2017 (including biannual DREs and PSAs).  . Kidney stone   . RUQ  abdominal pain Dec 2018;Jan 2019   Labs nl, abd plain films nl, MRCP nl.  GI did EUS 2019, normal except chronic gastritis, + H pylori.  Stool antigen test NEG after abx tx.    Past Surgical History Past Surgical History:  Procedure Laterality Date  . CHOLECYSTECTOMY  2010  . COLONOSCOPY    . COLONOSCOPY W/ POLYPECTOMY  01/20/2009   Multiple nonadenomatous polyps.  Recall 10 yrs.  . ESOPHAGOGASTRODUODENOSCOPY  02/14/2018   +EUS: Chronic active gastritis, + H PYLORI, tx'd with abx by GI, stool antigen test NEG after abx (04/2018)  . EUS N/A 02/14/2018   Procedure: UPPER ENDOSCOPIC ULTRASOUND (EUS) LINEAR;  Surgeon: Milus Banister, MD;  Location: [facility];  Service: Endoscopy;  Laterality: N/A;  . KNEE SURGERY Left 2000 and 2017   arthroscopic  . LUMBAR SPINE SURGERY  2000   No hardware  . NASAL SEPTUM SURGERY  1981  . POLYPECTOMY    . TONSILLECTOMY  1978  . UPPER GASTROINTESTINAL ENDOSCOPY      Family History Family History  Problem Relation Age of Onset  . Arthritis Mother   . Depression Mother   . Alzheimer's disease Mother   . Alcohol abuse Father   . Arthritis Father   . COPD Father   . Hearing loss Father   . Heart disease Father   . Colon cancer Sister 69  . Colon cancer Paternal Aunt   .  Colon cancer Cousin   . Colon polyps Neg Hx   . Esophageal cancer Neg Hx   . Rectal cancer Neg Hx   . Stomach cancer Neg Hx     Social History Social History   Socioeconomic History  . Marital status: Married    Spouse name: Not on file  . Number of children: Not on file  . Years of education: Not on file  . Highest education level: Not on file  Occupational History  . Not on file  Social Needs  . Financial resource strain: Not on file  . Food insecurity:    Worry: Not on file    Inability: Not on file  . Transportation needs:    Medical: Not on file    Non-medical: Not on file  Tobacco Use  . Smoking status: Former Smoker    Packs/day: 1.50    Years: 3.00     Pack years: 4.50    Types: Cigarettes    Last attempt to quit: 12/11/1976    Years since quitting: 41.9  . Smokeless tobacco: Never Used  Substance and Sexual Activity  . Alcohol use: No  . Drug use: No  . Sexual activity: Not on file  Lifestyle  . Physical activity:    Days per week: Not on file    Minutes per session: Not on file  . Stress: Not on file  Relationships  . Social connections:    Talks on phone: Not on file    Gets together: Not on file    Attends religious service: Not on file    Active member of club or organization: Not on file    Attends meetings of clubs or organizations: Not on file    Relationship status: Not on file  Other Topics Concern  . Not on file  Social History Narrative  . Not on file    Allergies Allergies  Allergen Reactions  . Levofloxacin Itching and Other (See Comments)    Whelps    Outpatient Meds Home medications from the H+P and/or nursing med reconciliation reviewed.  Inpatient med list reviewed  _____________________________________________________________________ Objective   Exam:  Current vital signs  Patient Vitals for the past 8 hrs:  BP Temp Temp src Pulse Resp SpO2 Height Weight  11/24/18 0910 (!) 137/94 - - 74 15 96 % - -  11/24/18 0802 122/77 - - 67 15 96 % - -  11/24/18 0638 125/86 - - 70 16 98 % - -  11/24/18 0600 134/68 - - 68 15 96 % - -  11/24/18 0533 - - - - - - 5\' 11"  (1.803 m) 117 kg  11/24/18 0532 (!) 114/97 97.6 F (36.4 C) Oral 67 16 97 % - -   No intake or output data in the 24 hours ending 11/24/18 0926  Physical Exam:  Blood pressure 130/70, pulse 72, respirations 16 and he looks comfortable. Sitting up in bed, in good spirits, conversational  General: this is a well-appearing patient in no acute distress with his wife Almyra Free at the bedside  Eyes: sclera anicteric, no redness  ENT: oral mucosa moist without lesions, no cervical or supraclavicular lymphadenopathy  CV: RRR without  murmur, S1/S2, no JVD,, no peripheral edema  Resp: clear to auscultation bilaterally, normal RR and effort noted  GI: soft, no tenderness, with active bowel sounds. No guarding or palpable organomegaly noted  Skin; warm and dry, no, pallor, rash or jaundice noted Rectal: scant maroon blood on glove  Labs:  Recent Labs  Lab 11/24/18 0603  WBC 5.9  HGB 15.7  HCT 47.9  PLT 182   Recent Labs  Lab 11/24/18 0603  NA 139  K 4.2  CL 104  CO2 26  BUN 24*  ALBUMIN 4.1  ALKPHOS 37*  ALT 28  AST 23  GLUCOSE 99   No results for input(s): INR in the last 168 hours.  Radiologic studies: None  His colonoscopy report was reviewed.  @ASSESSMENTPLANBEGIN @ Impression:  Hematochezia  This is a post polypectomy bleed, most likely from the largest right colon polyp.  However, I believe it has probably stopped, as evidenced by the significantly decreased blood past this morning, normal vital signs and hemoglobin. Is not on aspirin or anticoagulants, and most such post polypectomy bleeding will resolve on its own without the need for the scopic control.  Plan:  I believe he can go home from the emergency department this morning with the following plan:  Remain on clear liquid diet today  Instructions were given to him and his wife on how to make a bowel preparation at home with MiraLAX and Gatorade, to consume up to 2 L if that much as needed to have clear output.  Return immediately if he has passage of blood accompanied by lightheadedness, chest pain dyspnea.  They will call me by 3 PM at the latest with an update on his condition.  If it seems that the bleeding has cleared up, then the plan will be for him to come by our office 8 AM tomorrow for a stat CBC.  I will alert Dr. Ardis Hughs to this patient's condition.  If the patient worsens at any time, or if it is not clear from events over the day that his bleeding has stopped, he will be directed back to the emergency department  for admission and observation.  He and his wife are comfortable with this plan, and I discussed it with Dr. Rona Ravens, the ED physician.   Nelida Meuse III Office: [phone]

## 2018-11-24 NOTE — ED Notes (Signed)
Bed: CS91 Expected date:  Expected time:  Means of arrival:  Comments: GI bleed

## 2018-11-25 ENCOUNTER — Observation Stay (HOSPITAL_COMMUNITY): Payer: BLUE CROSS/BLUE SHIELD | Admitting: Anesthesiology

## 2018-11-25 ENCOUNTER — Telehealth: Payer: Self-pay | Admitting: *Deleted

## 2018-11-25 ENCOUNTER — Encounter (HOSPITAL_COMMUNITY): Payer: Self-pay | Admitting: *Deleted

## 2018-11-25 ENCOUNTER — Encounter (HOSPITAL_COMMUNITY)
Admission: EM | Disposition: A | Discharge status: Home / Self Care | Payer: Self-pay | Source: Home / Self Care | Attending: Emergency Medicine

## 2018-11-25 DIAGNOSIS — K922 Gastrointestinal hemorrhage, unspecified: Secondary | ICD-10-CM | POA: Diagnosis not present

## 2018-11-25 DIAGNOSIS — K91840 Postprocedural hemorrhage and hematoma of a digestive system organ or structure following a digestive system procedure: Secondary | ICD-10-CM | POA: Diagnosis not present

## 2018-11-25 DIAGNOSIS — Z86010 Personal history of colonic polyps: Secondary | ICD-10-CM

## 2018-11-25 HISTORY — PX: HOT HEMOSTASIS: SHX5433

## 2018-11-25 HISTORY — PX: COLONOSCOPY: SHX5424

## 2018-11-25 HISTORY — PX: SUBMUCOSAL INJECTION: SHX5543

## 2018-11-25 LAB — BASIC METABOLIC PANEL
Anion gap: 8 (ref 5–15)
BUN: 17 mg/dL (ref 8–23)
CO2: 24 mmol/L (ref 22–32)
Calcium: 8.6 mg/dL — ABNORMAL LOW (ref 8.9–10.3)
Chloride: 106 mmol/L (ref 98–111)
Creatinine, Ser: 1.16 mg/dL (ref 0.61–1.24)
Glucose, Bld: 89 mg/dL (ref 70–99)
Potassium: 4.1 mmol/L (ref 3.5–5.1)
Sodium: 138 mmol/L (ref 135–145)

## 2018-11-25 LAB — CBC
HCT: 47.4 % (ref 39.0–52.0)
HEMATOCRIT: 51.1 % (ref 39.0–52.0)
Hemoglobin: 15.5 g/dL (ref 13.0–17.0)
Hemoglobin: 16.5 g/dL (ref 13.0–17.0)
MCH: 31.3 pg (ref 26.0–34.0)
MCH: 31.1 pg (ref 26.0–34.0)
MCHC: 32.7 g/dL (ref 30.0–36.0)
MCHC: 32.3 g/dL (ref 30.0–36.0)
MCV: 95.6 fL (ref 80.0–100.0)
MCV: 96.4 fL (ref 80.0–100.0)
Platelets: 171 10*3/uL (ref 150–400)
Platelets: 206 10*3/uL (ref 150–400)
RBC: 4.96 MIL/uL (ref 4.22–5.81)
RBC: 5.3 MIL/uL (ref 4.22–5.81)
RDW: 13.2 % (ref 11.5–15.5)
RDW: 13.2 % (ref 11.5–15.5)
WBC: 5.5 10*3/uL (ref 4.0–10.5)
WBC: 6.7 10*3/uL (ref 4.0–10.5)
nRBC: 0 % (ref 0.0–0.2)
nRBC: 0 % (ref 0.0–0.2)

## 2018-11-25 LAB — HEMOGLOBIN AND HEMATOCRIT, BLOOD
HCT: 48.7 % (ref 39.0–52.0)
HCT: 53.3 % — ABNORMAL HIGH (ref 39.0–52.0)
Hemoglobin: 16.1 g/dL (ref 13.0–17.0)
Hemoglobin: 17 g/dL (ref 13.0–17.0)

## 2018-11-25 LAB — BASIC METABOLIC PANEL WITH GFR
GFR calc Af Amer: >60 mL/min (ref >60)
GFR calc non Af Amer: >60 mL/min (ref >60)

## 2018-11-25 LAB — ABO/RH: ABO/RH(D): A POS

## 2018-11-25 LAB — MAGNESIUM: Magnesium: 2.1 mg/dL (ref 1.7–2.4)

## 2018-11-25 SURGERY — COLONOSCOPY
Anesthesia: Monitor Anesthesia Care

## 2018-11-25 MED ORDER — HYDRALAZINE HCL 20 MG/ML IJ SOLN: 10.0000 mg | Freq: Three times a day (TID) | INTRAMUSCULAR | Status: DC | PRN

## 2018-11-25 MED ORDER — LACTATED RINGERS IV SOLN
INTRAVENOUS | Status: DC
  Administered 2018-11-25: 1000 mL via INTRAVENOUS

## 2018-11-25 MED ORDER — SODIUM CHLORIDE (PF) 0.9 % IJ SOLN
PREFILLED_SYRINGE | INTRAMUSCULAR | Status: DC | PRN
  Administered 2018-11-25: 3 mL

## 2018-11-25 MED ORDER — PROPOFOL 10 MG/ML IV BOLUS
INTRAVENOUS | Status: DC | PRN
  Administered 2018-11-25: 30 mg via INTRAVENOUS

## 2018-11-25 MED ORDER — PROPOFOL 10 MG/ML IV BOLUS
INTRAVENOUS | Status: AC
  Filled 2018-11-25: qty 20

## 2018-11-25 MED ORDER — PROPOFOL 500 MG/50ML IV EMUL
INTRAVENOUS | Status: DC | PRN
  Administered 2018-11-25: 150 ug/kg/min via INTRAVENOUS

## 2018-11-25 MED ORDER — PROPOFOL 10 MG/ML IV BOLUS
INTRAVENOUS | Status: AC
  Filled 2018-11-25: qty 40

## 2018-11-25 MED ORDER — EPINEPHRINE PF 1 MG/10ML IJ SOSY
PREFILLED_SYRINGE | INTRAMUSCULAR | Status: AC
  Filled 2018-11-25: qty 10

## 2018-11-25 MED ORDER — MAGNESIUM CITRATE PO SOLN
1.0000 | Freq: Once | ORAL | Status: AC
  Administered 2018-11-25: 2 via ORAL

## 2018-11-25 NOTE — Anesthesia Preprocedure Evaluation (Addendum)
Anesthesia Evaluation  Patient identified by MRN, date of birth, ID band Patient awake    Reviewed: Allergy & Precautions, NPO status , Patient's Chart, lab work & pertinent test results  History of Anesthesia Complications Negative for: history of anesthetic complications  Airway Mallampati: II  TM Distance: >3 FB Neck ROM: Full    Dental  (+) Dental Advisory Given, Chipped,    Pulmonary former smoker,    breath sounds clear to auscultation       Cardiovascular negative cardio ROS   Rhythm:Regular Rate:Normal     Neuro/Psych negative neurological ROS  negative psych ROS   GI/Hepatic GERD  Controlled, Steatosis    Endo/Other   Obesity   Renal/GU Renal InsufficiencyRenal disease Kidney stones     Hypogonadism     Musculoskeletal  (+) Arthritis ,   Abdominal   Peds  Hematology negative hematology ROS (+)   Anesthesia Other Findings   Reproductive/Obstetrics                            Anesthesia Physical Anesthesia Plan  ASA: II  Anesthesia Plan: MAC   Post-op Pain Management:    Induction: Intravenous  PONV Risk Score and Plan: 1 and Propofol infusion and Treatment may vary due to age or medical condition  Airway Management Planned: Nasal Cannula and Natural Airway  Additional Equipment: None  Intra-op Plan:   Post-operative Plan:   Informed Consent: I have reviewed the patients History and Physical, chart, labs and discussed the procedure including the risks, benefits and alternatives for the proposed anesthesia with the patient or authorized representative who has indicated his/her understanding and acceptance.     Plan Discussed with: CRNA and Anesthesiologist  Anesthesia Plan Comments:        Anesthesia Quick Evaluation

## 2018-11-25 NOTE — Progress Notes (Signed)
PROGRESS NOTE  Jordan Berg ZOX:[id number] DOB: Apr 11, 1956 DOA: 11/24/2018 PCP: Tammi Sou, MD  HPI/Recap of past 24 hours: HPI from Dr Monna Fam Doron Shake is a 62 y.o. male with medical history significant of colonic polyps, nephrolithiasis, testosterone deficiency who comes in with lower GI bleeding after colonoscopy.  Patient did have a screening colonoscopy on 11/22/2018 that showed several small polyps and one very large 17 mm polyp that was removed. Patient began to notice old blood mixed in with stool as well as some bright red blood per rectum. Due to persistent bleeding, Patient's GI doctor (Dr. Lance Sell) recommended presenting to the emergency department at [facility] for admission. In the emergency department patient's vitals were stable.  His hemoglobin on recheck was still normal. Patient admitted for further management.    Today, patient reported that he was still having bright red blood per rectum, denies any abdominal pain, chest pain, shortness of breath, dizziness, fever/chills.  GI on board.    Assessment/Plan: Principal Problem:   Acute lower GI bleeding Active Problems:   Hypercholesterolemia   Personal history of colonic polyps   GI bleeding  Post polypectomy lower GI bleed Patient continues to have bright red blood per rectum, though hemoglobin remained stable GI consulted: Colonoscopy done showed blood in the entire examined colon, with post polypectomy ulcer with visible vessel found in the proximal ascending colon which was injected with epi and treated with cautery and clips were placed for hemostasis GI recommended full liquid diet today, plans to resume regular diet tomorrow and oral iron supplementation upon discharge Daily CBC Monitor closely  Uncontrolled hypertension ? Likely due to pain post colonoscopy Not on any home meds IV hydralazine as needed  Obesity Advised lifestyle changes       Malnutrition Type:       Malnutrition Characteristics:      Nutrition Interventions:       Estimated body mass index is 35.57 kg/m as calculated from the following:   Height as of this encounter: 5\' 11"  (1.803 m).   Weight as of this encounter: 115.7 kg.     Code Status: Full  Family Communication: Wife at bedside  Disposition Plan: Likely home on 11/26/2018   Consultants:  GI  Procedures:  Colonoscopy on 11/25/2018  Antimicrobials:  None  DVT prophylaxis: SCDs   Objective: Vitals:   11/25/18 1515 11/25/18 1516 11/25/18 1520 11/25/18 1530  BP: (!) 179/117 (!) 173/105 (!) 157/103 (!) 146/100  Pulse: 87 93 92 85  Resp: 10 19 16 14   Temp:      TempSrc:      SpO2: 100% 100% 99% 100%  Weight:      Height:        Intake/Output Summary (Last 24 hours) at 11/25/2018 1930 Last data filed at 11/25/2018 1800 Gross per 24 hour  Intake 87.33 ml  Output -  Net 87.33 ml   Filed Weights   11/25/18 1345  Weight: 115.7 kg    Exam:   General: NAD  Cardiovascular: S1, S2 present  Respiratory: CTA B  Abdomen: Soft, nontender, nondistended, bowel sounds present  Musculoskeletal: No pedal edema bilaterally  Skin: Normal  Psychiatry: Normal mood   Data Reviewed: CBC: Recent Labs  Lab 11/24/18 0603 11/24/18 1624 11/24/18 2129 11/25/18 0350 11/25/18 0855 11/25/18 1036 11/25/18 1609  WBC 5.9 6.7  --  5.5  --  6.7  --   HGB 15.7 15.3 15.7 15.5 16.1 16.5 17.0  HCT 47.9  46.9 48.5 47.4 48.7 51.1 53.3*  MCV 97.6 96.7  --  95.6  --  96.4  --   PLT 182 200  --  171  --  206  --    Basic Metabolic Panel: Recent Labs  Lab 11/24/18 0603 11/25/18 0350 11/25/18 1036  NA 139 138  --   K 4.2 4.1  --   CL 104 106  --   CO2 26 24  --   GLUCOSE 99 89  --   BUN 24* 17  --   CREATININE 1.29* 1.16  --   CALCIUM 8.9 8.6*  --   MG  --   --  2.1   GFR: Estimated Creatinine Clearance: 84.4 mL/min (by C-G formula based on SCr of 1.16 mg/dL). Liver Function Tests: Recent  Labs  Lab 11/24/18 0603  AST 23  ALT 28  ALKPHOS 37*  BILITOT 1.2  PROT 6.7  ALBUMIN 4.1   No results for input(s): LIPASE, AMYLASE in the last 168 hours. No results for input(s): AMMONIA in the last 168 hours. Coagulation Profile: No results for input(s): INR, PROTIME in the last 168 hours. Cardiac Enzymes: No results for input(s): CKTOTAL, CKMB, CKMBINDEX, TROPONINI in the last 168 hours. BNP (last 3 results) No results for input(s): PROBNP in the last 8760 hours. HbA1C: No results for input(s): HGBA1C in the last 72 hours. CBG: No results for input(s): GLUCAP in the last 168 hours. Lipid Profile: No results for input(s): CHOL, HDL, LDLCALC, TRIG, CHOLHDL, LDLDIRECT in the last 72 hours. Thyroid Function Tests: No results for input(s): TSH, T4TOTAL, FREET4, T3FREE, THYROIDAB in the last 72 hours. Anemia Panel: No results for input(s): VITAMINB12, FOLATE, FERRITIN, TIBC, IRON, RETICCTPCT in the last 72 hours. Urine analysis:    Component Value Date/Time   BILIRUBINUR Negative 12/14/2017 1148   KETONESUR negative 03/01/2016 1526   PROTEINUR Negative 12/14/2017 1148   UROBILINOGEN 0.2 12/14/2017 1148   NITRITE Negative 12/14/2017 1148   LEUKOCYTESUR Negative 12/14/2017 1148   Sepsis Labs: @LABRCNTIP (procalcitonin:4,lacticidven:4)  )No results found for this or any previous visit (from the past 240 hour(s)).    Studies: No results found.  Scheduled Meds: . polyethylene glycol powder  1 Container Oral Once  . sodium chloride flush  3 mL Intravenous Q12H    Continuous Infusions: . sodium chloride       LOS: 0 days     Alma Friendly, MD [facility]  If 7PM-7AM, please contact night-coverage [email or web address] 11/25/2018, 7:30 PM

## 2018-11-25 NOTE — Anesthesia Postprocedure Evaluation (Signed)
Anesthesia Post Note  Patient: Gertie Exon  Procedure(s) Performed: COLONOSCOPY (N/A ) HOT HEMOSTASIS (ARGON PLASMA COAGULATION/BICAP) (N/A ) HEMOSTASIS CLIP PLACEMENT SUBMUCOSAL INJECTION     Patient location during evaluation: PACU Anesthesia Type: MAC Level of consciousness: awake and alert Pain management: pain level controlled Vital Signs Assessment: post-procedure vital signs reviewed and stable Respiratory status: spontaneous breathing, nonlabored ventilation and respiratory function stable Cardiovascular status: stable and blood pressure returned to baseline Anesthetic complications: no    Last Vitals:  Vitals:   11/25/18 1520 11/25/18 1530  BP: (!) 157/103 (!) 146/100  Pulse: 92 85  Resp: 16 14  Temp:    SpO2: 99% 100%    Last Pain:  Vitals:   11/25/18 1530  TempSrc:   PainSc: 0-No pain                 Audry Pili

## 2018-11-25 NOTE — Anesthesia Procedure Notes (Signed)
Procedure Name: MAC Date/Time: 11/25/2018 2:06 PM Performed by: Lollie Sails, CRNA Pre-anesthesia Checklist: Patient identified, Emergency Drugs available, Suction available, Patient being monitored and Timeout performed Oxygen Delivery Method: Simple face mask

## 2018-11-25 NOTE — Progress Notes (Addendum)
[facility] Consult Note  Primary Care Provider : Shawnie Dapper, MD Primary GI:   Oretha Caprice, MD Reason for consultation:  Lower GI bleeding  ASSESSMENT AND PLAN:   29. 62 yo male with post-polypectomy bleed. Probable site of bleeding is ascending colon where a 64mm polyp was removed on 11/22/18.  Seen in ED yesterday, bleeding had appeared to stop. Sent home with bowel prep, had recurrent bleeding and sent back to ED. Hgb has remained above 15 but had BM with "fresh" blood this am.  -will rapid prep him for a colonoscopy to be done around 3 pm today. Patient is agreeable to this plan.  -repeat CBC ~ 1 pm.  -Maybe home after colonoscopy  2. Mild AKI, resolved  Past Medical History:  Diagnosis Date  . Arthritis    mild  . Cataract    forming  . Colon polyps 01/20/2009   NON-adenomatous-recall 10 yrs  . GERD (gastroesophageal reflux disease)    mildly  . H. pylori infection   . Helicobacter pylori gastritis 02/2018   EGD+.  Stool antigen NEG after abx treatment.  . Hepatic steatosis    Noted on MRI abd 12/2017 (done for RUQ pain)  . Hypercholesterolemia 12/2017   Recommended statin 12/2017: pt chose [facility] and repeat chol 3 mo.  . Hypogonadism male    Managed by pt's urologist as of 12/2017 (including biannual DREs and PSAs).  . Kidney stone   . Personal history of colonic polyps 11/24/2018   Labs nl, abd plain films nl, MRCP nl.  GI did EUS 2019, normal except chronic gastritis, + H pylori.  Stool antigen test NEG after abx tx.   Past Surgical History:  Procedure Laterality Date  . CHOLECYSTECTOMY  2010  . COLONOSCOPY    . COLONOSCOPY W/ POLYPECTOMY  01/20/2009   Multiple nonadenomatous polyps.  Recall 10 yrs.  . ESOPHAGOGASTRODUODENOSCOPY  02/14/2018   +EUS: Chronic active gastritis, + H PYLORI, tx'd with abx by GI, stool antigen test NEG after abx (04/2018)  . EUS N/A 02/14/2018   Procedure: UPPER ENDOSCOPIC ULTRASOUND (EUS) LINEAR;  Surgeon: Milus Banister, MD;  Location: [facility];  Service: Endoscopy;  Laterality: N/A;  . KNEE SURGERY Left 2000 and 2017   arthroscopic  . LUMBAR SPINE SURGERY  2000   No hardware  . NASAL SEPTUM SURGERY  1981  . POLYPECTOMY    . TONSILLECTOMY  1978  . UPPER GASTROINTESTINAL ENDOSCOPY      Past Surgical History:  Procedure Laterality Date  . CHOLECYSTECTOMY  2010  . COLONOSCOPY    . COLONOSCOPY W/ POLYPECTOMY  01/20/2009   Multiple nonadenomatous polyps.  Recall 10 yrs.  . ESOPHAGOGASTRODUODENOSCOPY  02/14/2018   +EUS: Chronic active gastritis, + H PYLORI, tx'd with abx by GI, stool antigen test NEG after abx (04/2018)  . EUS N/A 02/14/2018   Procedure: UPPER ENDOSCOPIC ULTRASOUND (EUS) LINEAR;  Surgeon: Milus Banister, MD;  Location: [facility];  Service: Endoscopy;  Laterality: N/A;  . KNEE SURGERY Left 2000 and 2017   arthroscopic  . LUMBAR SPINE SURGERY  2000   No hardware  . NASAL SEPTUM SURGERY  1981  . POLYPECTOMY    . TONSILLECTOMY  1978  . UPPER GASTROINTESTINAL ENDOSCOPY     Allergies as of 11/24/2018 - Review Complete 11/24/2018  Allergen Reaction Noted  . Levofloxacin Itching and Other (See Comments) 12/23/2002     HPI:  Patient is a 62 yo male known to Dr.  Ardis Hughs. He has a history of chronic interimittent RUQ pain extensively evaluated by Dr. Ardis Hughs. On 11/1318 patient underwent screening colonoscopy. Three 2 to 4 mm polyps in the sigmoid colon, in the descending colon and in the ascending colon were removed with a cold snare. One 17 mm polyp in the ascending colon, removed piecemeal using a hot snare. Resected and retrieved. The following night patient began having painless rectal bleeding. He presented to ED yesterday. We evaluated him in ED ,  felt he was safe for discharge as bleeding seemed to have ceased and hgb was 15.7. He was given a bowel prep to take at home . He had some clearance of blood following the prep but then started bleeding again at which time we directed  him to ED. Patient was hemodynamically stable on arrival. Hgb still in 15 range. Patient admitted for further evaluation. Patient had a BM with "fresh" blood in it again this am. No abdominal pain. He feels okay. No dizziness. Hgb this am was 16.1, repeat at 10:30 was 16.5.     Vital signs in last 24 hours: Temp:  [97.5 F (36.4 C)-97.8 F (36.6 C)] 97.8 F (36.6 C) (12/16 0519) Pulse Rate:  [62-94] 68 (12/16 0519) Resp:  [15-18] 18 (12/16 0519) BP: (124-145)/(88-94) 145/94 (12/16 0519) SpO2:  [96 %-98 %] 97 % (12/16 0519) Last BM Date: 11/24/18 General:   Alert, well-developed male in NAD Heart:  Regular rate and rhythm.  No lower extremity edema   Pulm: Normal respiratory effort, lungs CTA bilaterally without wheezes or crackles. Abdomen:  Soft, nondistended, nontender.  Normal bowel sounds, no masses felt.       Neurologic:  Alert and  oriented x4;  grossly normal neurologically. Psych:  Pleasant, cooperative.  Normal mood and affect.   Intake/Output from previous day: No intake/output data recorded. Intake/Output this shift: No intake/output data recorded.  Lab Results: Recent Labs    11/24/18 0603 11/24/18 1624 11/24/18 2129 11/25/18 0350 11/25/18 0855  WBC 5.9 6.7  --  5.5  --   HGB 15.7 15.3 15.7 15.5 16.1  HCT 47.9 46.9 48.5 47.4 48.7  PLT 182 200  --  171  --    BMET Recent Labs    11/24/18 0603 11/25/18 0350  NA 139 138  K 4.2 4.1  CL 104 106  CO2 26 24  GLUCOSE 99 89  BUN 24* 17  CREATININE 1.29* 1.16  CALCIUM 8.9 8.6*   LFT Recent Labs    11/24/18 0603  PROT 6.7  ALBUMIN 4.1  AST 23  ALT 28  ALKPHOS 37*  BILITOT 1.2     Principal Problem:   Acute lower GI bleeding Active Problems:   Hypercholesterolemia   Personal history of colonic polyps   GI bleeding     LOS: 0 days   Tye Savoy ,NP 11/25/2018, 9:58 AM      .

## 2018-11-25 NOTE — Transfer of Care (Signed)
Immediate Anesthesia Transfer of Care Note  Patient: Gertie Exon  Procedure(s) Performed: COLONOSCOPY (N/A ) HOT HEMOSTASIS (ARGON PLASMA COAGULATION/BICAP) (N/A ) HEMOSTASIS CLIP PLACEMENT SUBMUCOSAL INJECTION  Patient Location: PACU and Endoscopy Unit  Anesthesia Type:MAC  Level of Consciousness: awake, drowsy and patient cooperative  Airway & Oxygen Therapy: Patient Spontanous Breathing and Patient connected to face mask oxygen  Post-op Assessment: Report given to RN and Post -op Vital signs reviewed and stable  Post vital signs: Reviewed and stable  Last Vitals:  Vitals Value Taken Time  BP    Temp    Pulse 91 11/25/2018  3:06 PM  Resp 9 11/25/2018  3:06 PM  SpO2 99 % 11/25/2018  3:06 PM  Vitals shown include unvalidated device data.  Last Pain:  Vitals:   11/25/18 1348  TempSrc: Oral  PainSc:       Patients Stated Pain Goal: 1 (54/49/20 1007)  Complications: No apparent anesthesia complications

## 2018-11-25 NOTE — Op Note (Signed)
[facility] Patient Name: Jordan Berg Procedure Date: 11/25/2018 MRN: [id number] Attending MD: Justice Britain , MD Date of Birth: 1956/05/15 CSN: [id number] Age: 62 Admit Type: Inpatient Procedure:                Colonoscopy Indications:              Treatment of bleeding from polypectomy site Providers:                Justice Britain, MD, Cleda Daub, RN, Janie                            Billups, Technician, Glenis Smoker, CRNA Referring MD:              Medicines:                Monitored Anesthesia Care Complications:            No immediate complications. Estimated Blood Loss:     Estimated blood loss: none. Procedure:                Pre-Anesthesia Assessment:                           - Prior to the procedure, a History and Physical                            was performed, and patient medications and                            allergies were reviewed. The patient's tolerance of                            previous anesthesia was also reviewed. The risks                            and benefits of the procedure and the sedation                            options and risks were discussed with the patient.                            All questions were answered, and informed consent                            was obtained. Prior Anticoagulants: The patient has                            taken no previous anticoagulant or antiplatelet                            agents. ASA Grade Assessment: II - A patient with                            mild systemic disease. After reviewing the risks  and benefits, the patient was deemed in                            satisfactory condition to undergo the procedure.                           After obtaining informed consent, the colonoscope                            was passed under direct vision. Throughout the                            procedure, the patient's blood pressure, pulse, and                     oxygen saturations were monitored continuously. The                            CF-HQ190L ([id number] Olympus adult colonoscope was                            introduced through the anus and advanced to the the                            cecum, identified by appendiceal orifice and                            ileocecal valve. The colonoscopy was performed                            without difficulty. The patient tolerated the                            procedure. Scope In: 2:19:24 PM Scope Out: 2:59:51 PM Scope Withdrawal Time: 0 hours 32 minutes 1 second  Total Procedure Duration: 0 hours 40 minutes 27 seconds  Findings:      Clotted blood was found in the entire colon.      A single (solitary) sixteen-twenty mm ulcer was found in the proximal       ascending colon from recent polypectomy. Oozing was present with a       visible vessel being present with evidence of stigmata of recent       bleeding being also present. Area was successfully injected with 3 mL of       a 1:10,000 solution of epinephrine for hemostasis. Coagulation for       bleeding prevention using bipolar gold probe was successful. For       continued hemostasis, nine hemostatic clips were successfully placed (MR       conditional). There was no bleeding at the end of the procedure.      The rest of the colon was not visualized in great depth due to the       intention of this procedure, the preparation of the colon, and as well       as the most likely source of bleeding having been found. Impression:               -  Blood in the entire examined colon.                           - A single (solitary) post-polypectomy ulcer with                            visible vessel was found in the proximal ascending                            colon. Injected with EPI. Treated with bipolar                            cautery. Clips (MR conditional) were placed for                            hemostasis. Moderate  Sedation:      Not Applicable - Patient had care per Anesthesia. Recommendation:           - The patient will be observed post-procedure,                            until all discharge criteria are met.                           - Return patient to hospital ward for ongoing care.                           - Patient has a contact number available for                            emergencies. The signs and symptoms of potential                            delayed complications were discussed with the                            patient. Return to normal activities tomorrow.                            Written discharge instructions were provided to the                            patient.                           - Full liquid diet today. Resume regular diet                            tomorrow if doing well..                           - Trend Hgb/Hct.                           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                           - The findings and recommendations were discussed                            with the referring physician.                           - The findings and recommendations were discussed                            with the patient's family. Procedure Code(s):        --- Professional ---                           [id number], Colonoscopy, flexible; with control of                            bleeding, any method Diagnosis Code(s):        --- Professional ---                           K92.2, Gastrointestinal hemorrhage, unspecified                           K63.3, Ulcer of intestine                           K91.840, Postprocedural hemorrhage of a digestive                            system organ or structure following a  digestive                            system procedure CPT copyright 2018 [employer]. All rights reserved. The codes documented in this report are preliminary and upon coder review may  be revised to meet current compliance requirements. GJustice Britain MD 11/25/2018 3:28:10 PM Number of Addenda: 0

## 2018-11-25 NOTE — H&P (Signed)
GASTROENTEROLOGY PROCEDURE H&P NOTE   Primary Care Physician: Tammi Sou, MD  HPI: Jordan Berg is a 62 y.o. male who presents for Colonoscopy.  Past Medical History:  Diagnosis Date  . Arthritis    mild  . Cataract    forming  . Colon polyps 01/20/2009   NON-adenomatous-recall 10 yrs  . GERD (gastroesophageal reflux disease)    mildly  . H. pylori infection   . Helicobacter pylori gastritis 02/2018   EGD+.  Stool antigen NEG after abx treatment.  . Hepatic steatosis    Noted on MRI abd 12/2017 (done for RUQ pain)  . Hypercholesterolemia 12/2017   Recommended statin 12/2017: pt chose [facility] and repeat chol 3 mo.  . Hypogonadism male    Managed by pt's urologist as of 12/2017 (including biannual DREs and PSAs).  . Kidney stone   . Personal history of colonic polyps 11/24/2018  . RUQ abdominal pain Dec 2018;Jan 2019   Labs nl, abd plain films nl, MRCP nl.  GI did EUS 2019, normal except chronic gastritis, + H pylori.  Stool antigen test NEG after abx tx.   Past Surgical History:  Procedure Laterality Date  . CHOLECYSTECTOMY  2010  . COLONOSCOPY    . COLONOSCOPY W/ POLYPECTOMY  01/20/2009   Multiple nonadenomatous polyps.  Recall 10 yrs.  . ESOPHAGOGASTRODUODENOSCOPY  02/14/2018   +EUS: Chronic active gastritis, + H PYLORI, tx'd with abx by GI, stool antigen test NEG after abx (04/2018)  . EUS N/A 02/14/2018   Procedure: UPPER ENDOSCOPIC ULTRASOUND (EUS) LINEAR;  Surgeon: Milus Banister, MD;  Location: [facility];  Service: Endoscopy;  Laterality: N/A;  . KNEE SURGERY Left 2000 and 2017   arthroscopic  . LUMBAR SPINE SURGERY  2000   No hardware  . NASAL SEPTUM SURGERY  1981  . POLYPECTOMY    . TONSILLECTOMY  1978  . UPPER GASTROINTESTINAL ENDOSCOPY     Current Facility-Administered Medications  Medication Dose Route Frequency Provider Last Rate Last Dose  . [MAR Hold] 0.9 %  sodium chloride infusion  250 mL Intravenous PRN Purohit, Konrad Dolores, MD      . Doug Sou  Hold] acetaminophen (TYLENOL) tablet 650 mg  650 mg Oral Q6H PRN Purohit, Konrad Dolores, MD       Or  . Doug Sou Hold] acetaminophen (TYLENOL) suppository 650 mg  650 mg Rectal Q6H PRN Purohit, Shrey C, MD      . lactated ringers infusion   Intravenous Continuous Mansouraty, Telford Nab., MD      . Doug Sou Hold] ondansetron [facility]) tablet 4 mg  4 mg Oral Q6H PRN Purohit, Konrad Dolores, MD       Or  . Doug Sou Hold] ondansetron (ZOFRAN) injection 4 mg  4 mg Intravenous Q6H PRN Purohit, Konrad Dolores, MD      . Doug Sou Hold] polyethylene glycol powder (GLYCOLAX/MIRALAX) container 255 g  1 Container Oral Once Nandigam, Venia Minks, MD      . Doug Sou Hold] sodium chloride flush (NS) 0.9 % injection 3 mL  3 mL Intravenous Q12H Purohit, Shrey C, MD   3 mL at 11/24/18 2226  . [MAR Hold] sodium chloride flush (NS) 0.9 % injection 3 mL  3 mL Intravenous PRN Purohit, Konrad Dolores, MD       Allergies  Allergen Reactions  . Levofloxacin Itching and Other (See Comments)    Whelps   Family History  Problem Relation Age of Onset  . Arthritis Mother   . Depression Mother   .  Alzheimer's disease Mother   . Alcohol abuse Father   . Arthritis Father   . COPD Father   . Hearing loss Father   . Heart disease Father   . Colon cancer Sister 22  . Colon cancer Paternal Aunt   . Colon cancer Cousin   . Colon polyps Neg Hx   . Esophageal cancer Neg Hx   . Rectal cancer Neg Hx   . Stomach cancer Neg Hx    Social History   Socioeconomic History  . Marital status: Married    Spouse name: Not on file  . Number of children: Not on file  . Years of education: Not on file  . Highest education level: Not on file  Occupational History  . Not on file  Social Needs  . Financial resource strain: Not on file  . Food insecurity:    Worry: Not on file    Inability: Not on file  . Transportation needs:    Medical: Not on file    Non-medical: Not on file  Tobacco Use  . Smoking status: Former Smoker    Packs/day: 1.50    Years: 3.00    Pack  years: 4.50    Types: Cigarettes    Last attempt to quit: 12/11/1976    Years since quitting: 41.9  . Smokeless tobacco: Never Used  Substance and Sexual Activity  . Alcohol use: No  . Drug use: No  . Sexual activity: Not on file  Lifestyle  . Physical activity:    Days per week: Not on file    Minutes per session: Not on file  . Stress: Not on file  Relationships  . Social connections:    Talks on phone: Not on file    Gets together: Not on file    Attends religious service: Not on file    Active member of club or organization: Not on file    Attends meetings of clubs or organizations: Not on file    Relationship status: Not on file  . Intimate partner violence:    Fear of current or ex partner: Not on file    Emotionally abused: Not on file    Physically abused: Not on file    Forced sexual activity: Not on file  Other Topics Concern  . Not on file  Social History Narrative  . Not on file    Physical Exam: Vital signs in last 24 hours: Temp:  [97.5 F (36.4 C)-97.8 F (36.6 C)] 97.8 F (36.6 C) (12/16 0519) Pulse Rate:  [62-94] 68 (12/16 0519) Resp:  [15-18] 18 (12/16 0519) BP: (124-145)/(88-94) 145/94 (12/16 0519) SpO2:  [96 %-98 %] 97 % (12/16 0519) Last BM Date: 11/24/18 GEN: NAD EYE: Sclerae anicteric ENT: MMM CV: RR without R/Gs  RESP: CTAB posteriorly GI: Soft, NT/ND NEURO:  Alert & Oriented x 3  Lab Results: Recent Labs    11/24/18 1624  11/25/18 0350 11/25/18 0855 11/25/18 1036  WBC 6.7  --  5.5  --  6.7  HGB 15.3   < > 15.5 16.1 16.5  HCT 46.9   < > 47.4 48.7 51.1  PLT 200  --  171  --  206   < > = values in this interval not displayed.   BMET Recent Labs    11/24/18 0603 11/25/18 0350  NA 139 138  K 4.2 4.1  CL 104 106  CO2 26 24  GLUCOSE 99 89  BUN 24* 17  CREATININE 1.29* 1.16  CALCIUM 8.9 8.6*   LFT Recent Labs    11/24/18 0603  PROT 6.7  ALBUMIN 4.1  AST 23  ALT 28  ALKPHOS 37*  BILITOT 1.2   PT/INR No results  for input(s): LABPROT, INR in the last 72 hours.   Impression / Plan: This is a 62 y.o.male who presents for Colonoscopy.  The risks and benefits of endoscopic evaluation were discussed with the patient; these include but are not limited to the risk of perforation, infection, bleeding, missed lesions, lack of diagnosis, severe illness requiring hospitalization, as well as anesthesia and sedation related illnesses.  The patient is agreeable to proceed.    Justice Britain, MD [facility] Advanced Endoscopy Office # [phone]

## 2018-11-25 NOTE — Consult Note (Addendum)
Please see the note from 11/25/2018 (this is noted as a progress note however it is actually a consultation note) for full details of patient's HPI.  Justice Britain, MD [facility] Advanced Endoscopy Office # [phone]

## 2018-11-25 NOTE — Telephone Encounter (Signed)
Spoke with patient and he was admitted into hospital yesterday for rectal bleeding.  Dr. Loletha Carrow and Dr. Ardis Hughs aware per patient.

## 2018-11-26 DIAGNOSIS — Z86010 Personal history of colonic polyps: Secondary | ICD-10-CM | POA: Diagnosis not present

## 2018-11-26 DIAGNOSIS — K922 Gastrointestinal hemorrhage, unspecified: Secondary | ICD-10-CM | POA: Diagnosis not present

## 2018-11-26 LAB — CBC WITH DIFFERENTIAL/PLATELET
Abs Immature Granulocytes: 0.05 10*3/uL (ref 0.00–0.07)
Basophils Absolute: 0.1 10*3/uL (ref 0.0–0.1)
Basophils Relative: 1 %
Eosinophils Absolute: 0.3 10*3/uL (ref 0.0–0.5)
Eosinophils Relative: 5 %
HCT: 48.6 % (ref 39.0–52.0)
Hemoglobin: 15.8 g/dL (ref 13.0–17.0)
IMMATURE GRANULOCYTES: 1 %
Lymphocytes Relative: 20 %
Lymphs Abs: 1.4 10*3/uL (ref 0.7–4.0)
MCH: 30.6 pg (ref 26.0–34.0)
MCHC: 32.5 g/dL (ref 30.0–36.0)
MCV: 94.2 fL (ref 80.0–100.0)
Monocytes Absolute: 0.7 10*3/uL (ref 0.1–1.0)
Monocytes Relative: 10 %
Neutro Abs: 4.5 10*3/uL (ref 1.7–7.7)
Neutrophils Relative %: 63 %
Platelets: 191 10*3/uL (ref 150–400)
RBC: 5.16 MIL/uL (ref 4.22–5.81)
RDW: 13 % (ref 11.5–15.5)
WBC: 7.1 10*3/uL (ref 4.0–10.5)
nRBC: 0 % (ref 0.0–0.2)

## 2018-11-26 MED ORDER — FERROUS SULFATE 325 (65 FE) MG PO TABS: 325.0000 mg | ORAL_TABLET | Freq: Every day | ORAL | 0 refills | Status: DC

## 2018-11-26 NOTE — Consult Note (Signed)
[facility] ROUNDING NOTE   Subjective: 62 yo male admitted with post polypectomy bleed after resection of a 14 mm ascending colon polyp on 11/22/18, presenting with hematochezia without hemodynamic instability or anemia. He underwent repeat colonoscopy on 11/25/18 which was n/f polypectomy site ulcer with visible vessel, which was treated with Epinephrine, cautery, and 9 hemostatic clips, with successful closure.   Overnight, no acute events and tolerating PO intake. No hematochezia and he denies any abdominal pain, fever, chills, nausea, or vomiting. He endorses no complaints, and eager to d/c to home, as today [birth year]. Path still pending from 11/22/18. Hgb 15 this AM.    Objective: Vital signs in last 24 hours: Temp:  [97.6 F (36.4 C)-98.6 F (37 C)] 98.6 F (37 C) (12/17 0639) Pulse Rate:  [64-93] 64 (12/17 0639) Resp:  [9-19] 17 (12/17 0639) BP: (112-179)/(89-117) 152/92 (12/17 0639) SpO2:  [95 %-100 %] 95 % (12/17 0639) Weight:  [115.7 kg] 115.7 kg (12/16 1345) Last BM Date: 11/26/18 General: NAD Abdomen: Soft, NT, ND    Intake/Output from previous day: 12/16 0701 - 12/17 0700 In: 267.3 [P.O.:180; I.V.:87.3] Out: 0  Intake/Output this shift: Total I/O In: 240 [P.O.:240] Out: -    Lab Results: Recent Labs    11/25/18 0350  11/25/18 1036 11/25/18 1609 11/26/18 0426  WBC 5.5  --  6.7  --  7.1  HGB 15.5   < > 16.5 17.0 15.8  PLT 171  --  206  --  191  MCV 95.6  --  96.4  --  94.2   < > = values in this interval not displayed.   BMET Recent Labs    11/24/18 0603 11/25/18 0350  NA 139 138  K 4.2 4.1  CL 104 106  CO2 26 24  GLUCOSE 99 89  BUN 24* 17  CREATININE 1.29* 1.16  CALCIUM 8.9 8.6*   LFT Recent Labs    11/24/18 0603  PROT 6.7  ALBUMIN 4.1  AST 23  ALT 28  ALKPHOS 37*  BILITOT 1.2   PT/INR No results for input(s): INR in the last 72 hours.    Imaging/Other results: No results found.    Assessment  62 yo  male admitted with post polypectomy bleed, successfully treated with combination of Epinephrine, bipolar cautery, and hemostatic clips x9 on 12/16, with complete cessation of bleeding.   Plan - Ok to d/c to home today - Path pending. 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 Will provide patient letter - Hold ASA, NSAIDs for at least 2 weeks. APAP ok for pain     Lavena Bullion, DO  11/26/2018, 10:27 AM [facility] Pager [phone]

## 2018-11-26 NOTE — Progress Notes (Signed)
Patient ID: Jordan Berg, male   DOB: [birth year], 62 y.o.   MRN: [id number]   To Whom it may concern;  Jordan Berg has been hospitalized 11/25/2018. He is advised to  remain out of work, and avoid lifting and strenuous activity  Over the next 10 days. He may return to work without restrictions on 12/30 /2019.  Any questions or concerns please call [facility]  [phone].  Sabastian Raimondi PA-C

## 2018-11-26 NOTE — Progress Notes (Signed)
Pt was discharged home today. Instructions were reviewed with patient, and questions were answered. Pt was taken to main entrance via wheelchair by NT.  

## 2018-11-26 NOTE — Discharge Summary (Signed)
Discharge Summary  Jordan Berg W[facility]:[id number] DOB: 1956/01/06  PCP: Tammi Sou, MD  Admit date: 11/24/2018 Discharge date: 11/26/2018  Time spent: 30 mins  Recommendations for Outpatient Follow-up:  1. PCP in 1 week 2. GI  Discharge Diagnoses:  Active Hospital Problems   Diagnosis Date Noted  . Acute lower GI bleeding 11/24/2018  . Personal history of colonic polyps 11/24/2018  . GI bleeding 11/24/2018  . Hypercholesterolemia 12/11/2017    Resolved Hospital Problems  No resolved problems to display.    Discharge Condition: Stable  Diet recommendation: Heart healthy  Vitals:   11/25/18 2206 11/26/18 0639  BP: 139/89 (!) 152/92  Pulse: 66 64  Resp: 15 17  Temp: 97.6 F (36.4 C) 98.6 F (37 C)  SpO2: 98% 95%    History of present illness:  Jordan Berg a 62 y.o.malewith medical history significant ofcolonic polyps, nephrolithiasis, testosterone deficiency who comes in with lower GI bleeding after colonoscopy. Patient did have a screening colonoscopy on 11/22/2018 that showed several small polyps and one very large 17 mm polyp that was removed. Patient began to notice old blood mixed in with stool as well as some bright red blood per rectum. Due to persistent bleeding,Patient's GI doctor (Dr. Lance Sell) recommended presenting to the emergency department at [facility] for admission. In the emergency department patient's vitals were stable. His hemoglobin on recheck was still normal. Patient admitted for further management.    Today, patient denies any new complaints, no further bleeds. Stable to be discharged from GI standpoint.   Hospital Course:  Principal Problem:   Acute lower GI bleeding Active Problems:   Hypercholesterolemia   Personal history of colonic polyps   GI bleeding  Post polypectomy lower GI bleed Patient denies any further bleeds post colonoscopy Hemoglobin stable GI consulted: Colonoscopy done showed blood  in the entire examined colon, with post polypectomy ulcer with visible vessel found in the proximal ascending colon which was injected with epi and treated with cautery and clips were placed for hemostasis GI recommended no strenuous activity for the next 10 days, oral iron supplementation upon discharge Follow up with PCP for repeat labs and GI  Uncontrolled/new onset hypertension ? Likely due to pain post colonoscopy Not on any home meds PCP to follow up for the need to start antihypertensives  Obesity Advised lifestyle changes          Malnutrition Type:      Malnutrition Characteristics:      Nutrition Interventions:      Estimated body mass index is 35.57 kg/m as calculated from the following:   Height as of this encounter: 5\' 11"  (1.803 m).   Weight as of this encounter: 115.7 kg.    Procedures:  Colonoscopy on 11/25/18  Consultations:  GI  Discharge Exam: BP (!) 152/92 (BP Location: Right Arm)   Pulse 64   Temp 98.6 F (37 C) (Oral)   Resp 17   Ht 5\' 11"  (1.803 m)   Wt 115.7 kg   SpO2 95%   BMI 35.57 kg/m   General: NAD Cardiovascular: S1, S2 present Respiratory: CTAB  Discharge Instructions You were cared for by a hospitalist during your hospital stay. If you have any questions about your discharge medications or the care you received while you were in the hospital after you are discharged, you can call the unit and asked to speak with the hospitalist on call if the hospitalist that took care of you is not available. Once  you are discharged, your primary care physician will handle any further medical issues. Please note that NO REFILLS for any discharge medications will be authorized once you are discharged, as it is imperative that you return to your primary care physician (or establish a relationship with a primary care physician if you do not have one) for your aftercare needs so that they can reassess your need for medications and monitor  your lab values.   Allergies as of 11/26/2018      Reactions   Levofloxacin Itching, Other (See Comments)   Whelps      Medication List    STOP taking these medications   ibuprofen 200 MG tablet Commonly known as:  ADVIL,MOTRIN     TAKE these medications   carboxymethylcellulose 0.5 % Soln Commonly known as:  REFRESH PLUS Place 1 drop into both eyes 3 (three) times daily as needed (dry eyes).   clobetasol cream 0.05 % Commonly known as:  TEMOVATE Apply 1 application topically daily as needed (dry skin).   CO Q-10 PO Take 10 mLs by mouth daily.   ferrous sulfate 325 (65 FE) MG tablet Take 1 tablet (325 mg total) by mouth daily with breakfast.   Fish Oil 1000 MG Caps Take 1 capsule by mouth daily.   LASTACAFT 0.25 % Soln Generic drug:  Alcaftadine Apply 1 drop to eye daily.   OVER THE COUNTER MEDICATION Take 1 capsule by mouth daily. BioAstin [place] Astaxanthin   PROSTATE HEALTH PO Take 1 capsule by mouth daily.   BLACK CHERRY CONCENTRATE PO Take 1 capsule by mouth daily.   Salicylic Acid 3 % Oint Apply 1 application topically daily as needed (for remove dry, dead skin).   testosterone cypionate 200 MG/ML injection Commonly known as:  DEPOTESTOSTERONE CYPIONATE Inject 60 mg into the muscle every Thursday.   TURMERIC PO Take 15 mLs by mouth daily. 15 ml daily      Allergies  Allergen Reactions  . Levofloxacin Itching and Other (See Comments)    Whelps   Follow-up Information    McGowen, Adrian Blackwater, MD. Schedule an appointment as soon as possible for a visit in 1 week(s).   Specialty:  Family Medicine Contact information: [address] [place] [address] [phone]            The results of significant diagnostics from this hospitalization (including imaging, microbiology, ancillary and laboratory) are listed below for reference.    Significant Diagnostic Studies: No results found.  Microbiology: No results found for this or  any previous visit (from the past 240 hour(s)).   Labs: Basic Metabolic Panel: Recent Labs  Lab 11/24/18 0603 11/25/18 0350 11/25/18 1036  NA 139 138  --   K 4.2 4.1  --   CL 104 106  --   CO2 26 24  --   GLUCOSE 99 89  --   BUN 24* 17  --   CREATININE 1.29* 1.16  --   CALCIUM 8.9 8.6*  --   MG  --   --  2.1   Liver Function Tests: Recent Labs  Lab 11/24/18 0603  AST 23  ALT 28  ALKPHOS 37*  BILITOT 1.2  PROT 6.7  ALBUMIN 4.1   No results for input(s): LIPASE, AMYLASE in the last 168 hours. No results for input(s): AMMONIA in the last 168 hours. CBC: Recent Labs  Lab 11/24/18 0603 11/24/18 1624  11/25/18 0350 11/25/18 0855 11/25/18 1036 11/25/18 1609 11/26/18 0426  WBC  5.9 6.7  --  5.5  --  6.7  --  7.1  NEUTROABS  --   --   --   --   --   --   --  4.5  HGB 15.7 15.3   < > 15.5 16.1 16.5 17.0 15.8  HCT 47.9 46.9   < > 47.4 48.7 51.1 53.3* 48.6  MCV 97.6 96.7  --  95.6  --  96.4  --  94.2  PLT 182 200  --  171  --  206  --  191   < > = values in this interval not displayed.   Cardiac Enzymes: No results for input(s): CKTOTAL, CKMB, CKMBINDEX, TROPONINI in the last 168 hours. BNP: BNP (last 3 results) No results for input(s): BNP in the last 8760 hours.  ProBNP (last 3 results) No results for input(s): PROBNP in the last 8760 hours.  CBG: No results for input(s): GLUCAP in the last 168 hours.     Signed:  Alma Friendly, MD [facility] 11/26/2018, 11:08 AM

## 2018-11-27 ENCOUNTER — Encounter (HOSPITAL_COMMUNITY): Payer: Self-pay | Admitting: Gastroenterology

## 2018-11-28 HISTORY — PX: COLONOSCOPY: SHX174

## 2018-12-03 ENCOUNTER — Encounter: Payer: Self-pay | Admitting: Family Medicine

## 2018-12-09 ENCOUNTER — Encounter: Payer: Self-pay | Admitting: Family Medicine

## 2018-12-27 ENCOUNTER — Ambulatory Visit (INDEPENDENT_AMBULATORY_CARE_PROVIDER_SITE_OTHER): Payer: BLUE CROSS/BLUE SHIELD | Admitting: Family Medicine

## 2018-12-27 ENCOUNTER — Encounter: Payer: Self-pay | Admitting: Family Medicine

## 2018-12-27 VITALS — BP 134/76 | HR 66 | Temp 98.2°F | Resp 16 | Ht 71.75 in | Wt 252.0 lb

## 2018-12-27 DIAGNOSIS — Z0000 Encounter for general adult medical examination without abnormal findings: Secondary | ICD-10-CM | POA: Diagnosis not present

## 2018-12-27 DIAGNOSIS — E7800 Pure hypercholesterolemia, unspecified: Secondary | ICD-10-CM | POA: Diagnosis not present

## 2018-12-27 LAB — LIPID PANEL
Cholesterol: 187 mg/dL (ref 0–200)
HDL: 36.9 mg/dL — ABNORMAL LOW (ref >39.00)
LDL Cholesterol: 126 mg/dL — ABNORMAL HIGH (ref 0–99)
NonHDL: 150.21
Total CHOL/HDL Ratio: 5
Triglycerides: 122 mg/dL (ref 0.0–149.0)
VLDL: 24.4 mg/dL (ref 0.0–40.0)

## 2018-12-27 NOTE — Progress Notes (Signed)
Office Note 12/27/2018  CC:  Chief Complaint  Patient presents with  . Annual Exam    Pt is fasting.    HPI:  Jordan Berg is a 63 y.o. White male who is here for annual health maintenance exam.  He says he is doing better with diet and exercise, attempts at wt loss. Would like to stay off statin if cholesterol still mildly elevated.  Past Medical History:  Diagnosis Date  . Arthritis    mild  . Cataract    forming  . Colon polyps 01/20/2009; 11/24/18   2010: NON-adenomatous-recall 10 yrs.  11/2018->adenomatous. repeat 6 mo b/c of piecemeal resection of one of the polyps  . GERD (gastroesophageal reflux disease)    mildly  . H. pylori infection   . Helicobacter pylori gastritis 02/2018   EGD+.  Stool antigen NEG after abx treatment.  . Hepatic steatosis    Noted on MRI abd 12/2017 (done for RUQ pain)  . Hypercholesterolemia 12/2017   Recommended statin 12/2017: pt chose TLC and repeat chol 3 mo.  . Hypogonadism male    Managed by pt's urologist as of 12/2017 (including biannual DREs and PSAs).  . Kidney stone   . RUQ abdominal pain Dec 2018;Jan 2019   Labs nl, abd plain films nl, MRCP nl.  GI did EUS 2019, normal except chronic gastritis, + H pylori.  Stool antigen test NEG after abx tx.    Past Surgical History:  Procedure Laterality Date  . CHOLECYSTECTOMY  2010  . COLONOSCOPY    . COLONOSCOPY N/A 11/25/2018   Adenomatous: recall 6 mo due to piecemeal resection of one of the polyps.  Procedure: COLONOSCOPY;  Surgeon: Rush Landmark Telford Nab., MD;  Location: [facility];  Service: Gastroenterology;  Laterality: N/A;  . COLONOSCOPY W/ POLYPECTOMY  01/20/2009   Multiple nonadenomatous polyps.  Recall 10 yrs.  . ESOPHAGOGASTRODUODENOSCOPY  02/14/2018   +EUS: Chronic active gastritis, + H PYLORI, tx'd with abx by GI, stool antigen test NEG after abx (04/2018)  . EUS N/A 02/14/2018   Procedure: UPPER ENDOSCOPIC ULTRASOUND (EUS) LINEAR;  Surgeon: Milus Banister, MD;   Location: [facility];  Service: Endoscopy;  Laterality: N/A;  . HOT HEMOSTASIS N/A 11/25/2018   Procedure: HOT HEMOSTASIS (ARGON PLASMA COAGULATION/BICAP);  Surgeon: Irving Copas., MD;  Location: [facility];  Service: Gastroenterology;  Laterality: N/A;  . KNEE SURGERY Left 2000 and 2017   arthroscopic  . LUMBAR SPINE SURGERY  2000   No hardware  . NASAL SEPTUM SURGERY  1981  . POLYPECTOMY    . SUBMUCOSAL INJECTION  11/25/2018   Procedure: SUBMUCOSAL INJECTION;  Surgeon: Rush Landmark Telford Nab., MD;  Location: [facility];  Service: Gastroenterology;;  . [place]  . UPPER GASTROINTESTINAL ENDOSCOPY      Family History  Problem Relation Age of Onset  . Arthritis Mother   . Depression Mother   . Alzheimer's disease Mother   . Alcohol abuse Father   . Arthritis Father   . COPD Father   . Hearing loss Father   . Heart disease Father   . Colon cancer Sister 9  . Colon cancer Paternal Aunt   . Colon cancer Cousin   . Colon polyps Neg Hx   . Esophageal cancer Neg Hx   . Rectal cancer Neg Hx   . Stomach cancer Neg Hx     Social History   Socioeconomic History  . Marital status: Married    Spouse name: Not on file  .  Number of children: Not on file  . Years of education: Not on file  . Highest education level: Not on file  Occupational History  . Not on file  Social Needs  . Financial resource strain: Not on file  . Food insecurity:    Worry: Not on file    Inability: Not on file  . Transportation needs:    Medical: Not on file    Non-medical: Not on file  Tobacco Use  . Smoking status: Former Smoker    Packs/day: 1.50    Years: 3.00    Pack years: 4.50    Types: Cigarettes    Last attempt to quit: 12/11/1976    Years since quitting: 42.0  . Smokeless tobacco: Never Used  Substance and Sexual Activity  . Alcohol use: No  . Drug use: No  . Sexual activity: Not on file  Lifestyle  . Physical activity:    Days per week: Not on file     Minutes per session: Not on file  . Stress: Not on file  Relationships  . Social connections:    Talks on phone: Not on file    Gets together: Not on file    Attends religious service: Not on file    Active member of club or organization: Not on file    Attends meetings of clubs or organizations: Not on file    Relationship status: Not on file  . Intimate partner violence:    Fear of current or ex partner: Not on file    Emotionally abused: Not on file    Physically abused: Not on file    Forced sexual activity: Not on file  Other Topics Concern  . Not on file  Social History Narrative  . Not on file    Outpatient Medications Prior to Visit  Medication Sig Dispense Refill  . Alcaftadine (LASTACAFT) 0.25 % SOLN Apply 1 drop to eye daily.     . carboxymethylcellulose (REFRESH PLUS) 0.5 % SOLN Place 1 drop into both eyes 3 (three) times daily as needed (dry eyes).    . clobetasol cream (TEMOVATE) 1.19 % Apply 1 application topically daily as needed (dry skin).    . Coenzyme Q10 (CO Q-10 PO) Take 10 mLs by mouth daily.    . Misc Natural Products (BLACK CHERRY CONCENTRATE PO) Take 1 capsule by mouth daily.     . Misc Natural Products (PROSTATE HEALTH PO) Take 1 capsule by mouth daily.    . Omega-3 Fatty Acids (FISH OIL) 1000 MG CAPS Take 1 capsule by mouth daily.     Marland Kitchen OVER THE COUNTER MEDICATION Take 1 capsule by mouth daily. BioAstin Hawaiian Astaxanthin     . Salicylic Acid 3 % OINT Apply 1 application topically daily as needed (for remove dry, dead skin).     Marland Kitchen testosterone cypionate (DEPOTESTOSTERONE CYPIONATE) 200 MG/ML injection Inject 60 mg into the muscle every Thursday.   1  . TURMERIC PO Take 15 mLs by mouth daily. 15 ml daily     . ferrous sulfate 325 (65 FE) MG tablet Take 1 tablet (325 mg total) by mouth daily with breakfast. 30 tablet 0   No facility-administered medications prior to visit.     Allergies  Allergen Reactions  . Levofloxacin Itching and Other (See  Comments)    [place]    ROS Review of Systems  Constitutional: Negative for appetite change, chills, fatigue and fever.  HENT: Negative for congestion, dental problem, ear pain and sore  throat.   Eyes: Negative for discharge, redness and visual disturbance.  Respiratory: Negative for cough, chest tightness, shortness of breath and wheezing.   Cardiovascular: Negative for chest pain, palpitations and leg swelling.  Gastrointestinal: Negative for abdominal pain, blood in stool, diarrhea, nausea and vomiting.       Mild, intermittent R mid abd pain occurs sometimes--->chronic.    Genitourinary: Negative for difficulty urinating, dysuria, flank pain, frequency, hematuria and urgency.  Musculoskeletal: Negative for arthralgias, back pain, joint swelling, myalgias and neck stiffness.  Skin: Negative for pallor and rash.  Neurological: Negative for dizziness, speech difficulty, weakness and headaches.  Hematological: Negative for adenopathy. Does not bruise/bleed easily.  Psychiatric/Behavioral: Negative for confusion and sleep disturbance. The patient is not nervous/anxious.     PE; Blood pressure 134/76, pulse 66, temperature 98.2 F (36.8 C), temperature source Oral, resp. rate 16, height 5' 11.75" (1.822 m), weight 252 lb (114.3 kg), SpO2 97 %. Body mass index is 34.42 kg/m.  Gen: Alert, well appearing.  Patient is oriented to person, place, time, and situation. AFFECT: pleasant, lucid thought and speech. ENT: Ears: EACs clear, normal epithelium.  TMs with good light reflex and landmarks bilaterally.  Eyes: no injection, icteris, swelling, or exudate.  EOMI, PERRLA. Nose: no drainage or turbinate edema/swelling.  No injection or focal lesion.  Mouth: lips without lesion/swelling.  Oral mucosa pink and moist.  Dentition intact and without obvious caries or gingival swelling.  Oropharynx without erythema, exudate, or swelling.  Neck: supple/nontender.  No LAD, mass, or TM.  Carotid pulses 2+  bilaterally, without bruits. CV: RRR, no m/r/g.   LUNGS: CTA bilat, nonlabored resps, good aeration in all lung fields. ABD: soft, NT, ND, BS normal.  No hepatospenomegaly or mass.  No bruits. EXT: no clubbing, cyanosis, or edema.  Musculoskeletal: no joint swelling, erythema, warmth, or tenderness.  ROM of all joints intact. Skin - no sores or suspicious lesions or rashes or color changes   Pertinent labs:  No results found for: TSH Lab Results  Component Value Date   WBC 7.1 11/26/2018   HGB 15.8 11/26/2018   HCT 48.6 11/26/2018   MCV 94.2 11/26/2018   PLT 191 11/26/2018   Lab Results  Component Value Date   CREATININE 1.16 11/25/2018   BUN 17 11/25/2018   NA 138 11/25/2018   K 4.1 11/25/2018   CL 106 11/25/2018   CO2 24 11/25/2018   Lab Results  Component Value Date   ALT 28 11/24/2018   AST 23 11/24/2018   ALKPHOS 37 (L) 11/24/2018   BILITOT 1.2 11/24/2018   Lab Results  Component Value Date   CHOL 168 12/27/2017   Lab Results  Component Value Date   HDL 36.80 (L) 12/27/2017   Lab Results  Component Value Date   LDLCALC 112 (H) 12/27/2017   Lab Results  Component Value Date   TRIG 96.0 12/27/2017   Lab Results  Component Value Date   CHOLHDL 5 12/27/2017   No results found for: PSA  No results found for: HGBA1C   ASSESSMENT AND PLAN:   Health maintenance exam: Reviewed age and gender appropriate health maintenance issues (prudent diet, regular exercise, health risks of tobacco and excessive alcohol, use of seatbelts, fire alarms in home, use of sunscreen).  Also reviewed age and gender appropriate health screening as well as vaccine recommendations. Vaccines:  Flu vaccine--> UTD.   Shingrix #2-->pt states he got this but he can't recall where-->approx 4 months after the first.  Labs: has had recent (1 mo ago) CMET and CBC that were normal.  Will do FLP today. Prostate ca screening: managed by his urologist and this has recently been done per  pt. Colon ca screening: next colonoscopy due 04/2019.  An After Visit Summary was printed and given to the patient.  FOLLOW UP:  Return in about 1 year (around 12/28/2019) for annual CPE (fasting).  Signed:  Crissie Sickles, MD           12/27/2018

## 2018-12-27 NOTE — Patient Instructions (Signed)

## 2018-12-30 ENCOUNTER — Encounter: Payer: Self-pay | Admitting: *Deleted

## 2019-01-06 ENCOUNTER — Ambulatory Visit: Payer: BLUE CROSS/BLUE SHIELD | Admitting: Gastroenterology

## 2019-01-06 ENCOUNTER — Encounter: Payer: Self-pay | Admitting: Gastroenterology

## 2019-01-06 VITALS — BP 120/76 | HR 70 | Ht 71.73 in | Wt 249.0 lb

## 2019-01-06 DIAGNOSIS — Z9889 Other specified postprocedural states: Secondary | ICD-10-CM

## 2019-01-06 DIAGNOSIS — D122 Benign neoplasm of ascending colon: Secondary | ICD-10-CM

## 2019-01-06 NOTE — Patient Instructions (Addendum)
Recall colonoscopy May 2020 to check site of piecemeal resected polyp 11/2018.  Normal BMI (Body Mass Index- based on height and weight) is between 19 and 25. Your BMI today is Body mass index is 34.03 kg/m. Marland Kitchen Please consider follow up  regarding your BMI with your Primary Care Provider.  Thank you for entrusting me with your care and choosing [facility].  Dr Ardis Hughs

## 2019-01-06 NOTE — Progress Notes (Signed)
Review of pertinent gastrointestinal problems: 1.Symptomatic cholelithiasis 2010 confirmed by ultrasound, eventual laparoscopic cholecystectomy with normal-appearing Intra-Op cholangiogram 2010. 2.Routine risk for colon cancer,colonoscopy Dr. 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 He was recommended to have repeat screening at 10-year interval.  Colonoscopy December 2019 Dr. Ardis Hughs found for polyps in the colon, all were removed.  All were adenomatous on pathology.  The largest was 17 mm, located in the ascending colon, removed with piecemeal resection, apparently completely.  He suffered post polypectomy bleeding and 3 days later underwent repeat colonoscopy.  A visible vessel is noted in the ascending colon site, it was treated with epinephrine, BiCAP cautery and 9 endoscopic clips were placed.  His hemoglobin never dropped below normal values. 3. RUQ pains 2019, likely from H. Pylori gastritis: LFTs normal, MRI with MRCP essentially normal except fatty appearing liver.  EUS Dr. Ardis Hughs 02/2018 was essentially normal as well however mild gastritis was biospied and path + for H. Pylori. Was treated with pylera.  04/2018 H. Pylori stool test was negative, confirming eradication.    HPI: This is a very pleasant 63 year old man whom I last saw at the time of a colonoscopy a little over a month ago.  Chief complaint is post polypectomy bleeding  See the record above.  He suffered post polypectomy bleeding after the colonoscopy in early December.  Presented to the emergency room, his blood counts were normal, he never required any blood transfusion but he did undergo a repeat colonoscopy with 1 of my partners.  The ascending colon site had a visible vessel with an ulcer.  It was treated with  epinephrine injection, cautery and then placement of 9 endoscopic clips.  Since leaving the hospital he has had no further overt bleeding.  He is felt quite well overall.   ROS: complete GI ROS as described in HPI, all other review negative.  Constitutional:  No unintentional weight loss   Past Medical History:  Diagnosis Date  . Arthritis    mild  . Cataract    forming  . Colon polyps 01/20/2009; 11/24/18   2010: NON-adenomatous-recall 10 yrs.  11/2018->adenomatous. repeat 6 mo b/c of piecemeal resection of one of the polyps  . GERD (gastroesophageal reflux disease)    mildly  . H. pylori infection   . Helicobacter pylori gastritis 02/2018   EGD+.  Stool antigen NEG after abx treatment.  . Hepatic steatosis    Noted on MRI abd 12/2017 (done for RUQ pain)  . Hypercholesterolemia 12/2017   Recommended statin 12/2017: pt chose TLC and repeat chol 3 mo.  . Hypogonadism male    Managed by pt's urologist as of 12/2017 (including biannual DREs and PSAs).  . Kidney stone   . RUQ abdominal pain Dec 2018;Jan 2019   Labs nl, abd plain films nl, MRCP nl.  GI did EUS 2019, normal except chronic gastritis, + H pylori.  Stool antigen test NEG after abx tx.    Past Surgical History:  Procedure Laterality Date  . CHOLECYSTECTOMY  2010  . COLONOSCOPY    . COLONOSCOPY N/A 11/25/2018   Adenomatous: recall 6 mo due to piecemeal resection of one of the polyps.  Procedure: COLONOSCOPY;  Surgeon: Rush Landmark Telford Nab., MD;  Location: [facility];  Service: Gastroenterology;  Laterality: N/A;  . COLONOSCOPY W/  POLYPECTOMY  01/20/2009   Multiple nonadenomatous polyps.  Recall 10 yrs.  . ESOPHAGOGASTRODUODENOSCOPY  02/14/2018   +EUS: Chronic active gastritis, + H PYLORI, tx'd with abx by GI, stool antigen test NEG after abx (04/2018)  . EUS N/A 02/14/2018   Procedure: UPPER ENDOSCOPIC ULTRASOUND (EUS) LINEAR;  Surgeon: Milus Banister, MD;  Location: [facility];  Service: Endoscopy;  Laterality:  N/A;  . HOT HEMOSTASIS N/A 11/25/2018   Procedure: HOT HEMOSTASIS (ARGON PLASMA COAGULATION/BICAP);  Surgeon: Irving Copas., MD;  Location: [facility];  Service: Gastroenterology;  Laterality: N/A;  . KNEE SURGERY Left 2000 and 2017   arthroscopic  . LUMBAR SPINE SURGERY  2000   No hardware  . NASAL SEPTUM SURGERY  1981  . POLYPECTOMY    . SUBMUCOSAL INJECTION  11/25/2018   Procedure: SUBMUCOSAL INJECTION;  Surgeon: Rush Landmark Telford Nab., MD;  Location: [facility];  Service: Gastroenterology;;  . [place]  . UPPER GASTROINTESTINAL ENDOSCOPY      Current Outpatient Medications  Medication Sig Dispense Refill  . Alcaftadine (LASTACAFT) 0.25 % SOLN Apply 1 drop to eye daily.     . carboxymethylcellulose (REFRESH PLUS) 0.5 % SOLN Place 1 drop into both eyes 3 (three) times daily as needed (dry eyes).    . clobetasol cream (TEMOVATE) 2.02 % Apply 1 application topically daily as needed (dry skin).    . Coenzyme Q10 (CO Q-10 PO) Take 10 mLs by mouth daily.    . Misc Natural Products (BLACK CHERRY CONCENTRATE PO) Take 1 capsule by mouth daily.     . Misc Natural Products (PROSTATE HEALTH PO) Take 1 capsule by mouth daily.    . Omega-3 Fatty Acids (FISH OIL) 1000 MG CAPS Take 1 capsule by mouth daily.     Marland Kitchen OVER THE COUNTER MEDICATION Take 1 capsule by mouth daily. BioAstin [place] Astaxanthin     . Salicylic Acid 3 % OINT Apply 1 application topically daily as needed (for remove dry, dead skin).     Marland Kitchen testosterone cypionate (DEPOTESTOSTERONE CYPIONATE) 200 MG/ML injection Inject 60 mg into the muscle every Thursday.   1  . TURMERIC PO Take 15 mLs by mouth daily. 15 ml daily      No current facility-administered medications for this visit.     Allergies as of 01/06/2019 - Review Complete 01/06/2019  Allergen Reaction Noted  . Levofloxacin Itching and Other (See Comments) 12/23/2002    Family History  Problem Relation Age of Onset  . Arthritis Mother   .  Depression Mother   . Alzheimer's disease Mother   . Alcohol abuse Father   . Arthritis Father   . COPD Father   . Hearing loss Father   . Heart disease Father   . Colon cancer Sister 58  . Colon cancer Paternal Aunt   . Colon cancer Cousin   . Colon polyps Neg Hx   . Esophageal cancer Neg Hx   . Rectal cancer Neg Hx   . Stomach cancer Neg Hx     Social History   Socioeconomic History  . Marital status: Married    Spouse name: Not on file  . Number of children: Not on file  . Years of education: Not on file  . Highest education level: Not on file  Occupational History  . Not on file  Social Needs  . Financial resource strain: Not on file  . Food insecurity:    Worry: Not on file    Inability: Not on  file  . Transportation needs:    Medical: Not on file    Non-medical: Not on file  Tobacco Use  . Smoking status: Former Smoker    Packs/day: 1.50    Years: 3.00    Pack years: 4.50    Types: Cigarettes    Last attempt to quit: 12/11/1976    Years since quitting: 42.0  . Smokeless tobacco: Never Used  Substance and Sexual Activity  . Alcohol use: No  . Drug use: No  . Sexual activity: Not on file  Lifestyle  . Physical activity:    Days per week: Not on file    Minutes per session: Not on file  . Stress: Not on file  Relationships  . Social connections:    Talks on phone: Not on file    Gets together: Not on file    Attends religious service: Not on file    Active member of club or organization: Not on file    Attends meetings of clubs or organizations: Not on file    Relationship status: Not on file  . Intimate partner violence:    Fear of current or ex partner: Not on file    Emotionally abused: Not on file    Physically abused: Not on file    Forced sexual activity: Not on file  Other Topics Concern  . Not on file  Social History Narrative  . Not on file     Physical Exam: BP 120/76   Pulse 70   Ht 5' 11.73" (1.822 m)   Wt 249 lb (112.9 kg)    BMI 34.03 kg/m  Constitutional: generally well-appearing Psychiatric: alert and oriented x3 Abdomen: soft, nontender, nondistended, no obvious ascites, no peritoneal signs, normal bowel sounds No peripheral edema noted in lower extremities  Assessment and plan: 63 y.o. male with post polypectomy bleeding, resolved  We discussed his post polypectomy bleed.  It has clearly resolved.  He understands that he will still need repeat colonoscopy in May, June 2022 check for any residual adenomatous mucosa at the site given piecemeal polypectomy technique.  He knows to call here sooner if he has any questions or concerns.  Please see the "Patient Instructions" section for addition details about the plan.  Owens Loffler, MD [facility] 01/06/2019, 10:47 AM

## 2019-01-08 ENCOUNTER — Encounter: Payer: Self-pay | Admitting: Family Medicine

## 2019-04-23 ENCOUNTER — Other Ambulatory Visit: Payer: Self-pay

## 2019-04-23 ENCOUNTER — Encounter: Payer: Self-pay | Admitting: Gastroenterology

## 2019-04-23 ENCOUNTER — Ambulatory Visit (INDEPENDENT_AMBULATORY_CARE_PROVIDER_SITE_OTHER): Payer: BLUE CROSS/BLUE SHIELD | Admitting: Gastroenterology

## 2019-04-23 VITALS — Ht 71.0 in | Wt 250.0 lb

## 2019-04-23 DIAGNOSIS — D122 Benign neoplasm of ascending colon: Secondary | ICD-10-CM

## 2019-04-23 MED ORDER — PEG 3350-KCL-NA BICARB-NACL 420 G PO SOLR: 4000.0000 mL | ORAL | 0 refills | Status: DC

## 2019-04-23 NOTE — Patient Instructions (Signed)
   We will arrange a surveillance colonoscopy in the next 1 to 2 weeks for a 17 mm adenomatous polyp that was piecemeal resected December 2019.

## 2019-04-23 NOTE — Progress Notes (Signed)
Review of pertinent gastrointestinal problems: 1.Symptomatic cholelithiasis 2010 confirmed by ultrasound, eventual laparoscopic cholecystectomy with normal-appearing Intra-Op cholangiogram 2010. 2.Routine risk for colon cancer,colonoscopy Dr. 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 He was recommended to have repeat screening at 10-year interval.  Colonoscopy December 2019 Dr. Ardis Hughs found for polyps in the colon, all were removed.  All were adenomatous on pathology.  The largest was 17 mm, located in the ascending colon, removed with piecemeal resection, apparently completely. (recall recommended at 6 month interval).  He suffered post polypectomy bleeding and 3 days later underwent repeat colonoscopy.  A visible vessel is noted in the ascending colon site, it was treated with epinephrine, BiCAP cautery and 9 endoscopic clips were placed.  His hemoglobin never dropped below normal values. 3. RUQ pains 2019, likely from H. Pylori gastritis:LFTs normal, MRI with MRCP essentially normal except fatty appearing liver. EUS Dr. Ardis Hughs 02/2018 was essentially normal as well however mild gastritis was biospied and path + for H. Pylori. Was treated with pylera.  04/2018 H. Pylori stool test was negative, confirming eradication.   This service was provided via virtual visit.  We attempted audio and visual however he never received the AV link which I sent and so we went with audio only the patient was located at home.  I was located in my office.  The patient did consent to this virtual visit and is aware of possible charges through their insurance for this visit.  The patient is an established patient.  My certified medical assistant, Grace Bushy, contributed to this visit by contacting the patient by  phone 1 or 2 business days prior to the appointment and also followed up on the recommendations I made after the visit.  Time spent on virtual visit: 18 min   HPI: This is a very pleasant 63 year old man whom I last saw about 4 months ago as follow-up for his post polypectomy bleed admission.  Since then he has done very well.  He has no bleeding, no significant abdominal pains besides his usual chronic right upper quadrant intermittent pains that has been worked up completely.  No significant constipation or diarrhea.    Chief complaint is personal history of adenomatous colon polyps  ROS: complete GI ROS as described in HPI, all other review negative.  Constitutional:  No unintentional weight loss   Past Medical History:  Diagnosis Date  . Arthritis    mild  . Cataract    forming  . Colon polyps 01/20/2009; 11/24/18   2010: NON-adenomatous-recall 10 yrs.  11/2018->adenomatous. repeat 6 mo b/c of piecemeal resection of one of the polyps. +Postpolypectomy bleeding-->colonoscopy 3d after polypectomy to fix this.  . Fatty liver    Noted on EUS  . GERD (gastroesophageal reflux disease)    mildly  . Helicobacter pylori gastritis 02/2018   EGD+.  Stool antigen NEG after abx treatment.  . Hepatic steatosis    Noted on MRI abd 12/2017 (done for RUQ pain)  . Hypercholesterolemia 12/2017   Recommended statin 12/2017: pt chose [facility] and repeat chol 3 mo.  . Hypogonadism male    Managed by pt's urologist as of 12/2017 (including biannual DREs and PSAs).  . Kidney stone   . RUQ abdominal pain Dec 2018;Jan 2019   Labs nl, abd plain films nl, MRCP  nl.  GI did EUS 2019, normal except chronic gastritis, + H pylori.  Stool antigen test NEG after abx tx.    Past Surgical History:  Procedure Laterality Date  . CHOLECYSTECTOMY  2010  . COLONOSCOPY    . COLONOSCOPY N/A 11/25/2018   Adenomatous: recall 6 mo due to piecemeal resection of one of the polyps.  Procedure: COLONOSCOPY;  Surgeon:  Rush Landmark Telford Nab., MD;  Location: [facility];  Service: Gastroenterology;  Laterality: N/A;  . COLONOSCOPY  11/28/2018   for post polypectomy bleeding.  Vessel cauterized/clipped  . COLONOSCOPY W/ POLYPECTOMY  01/20/2009   Multiple nonadenomatous polyps.  Recall 10 yrs.  . ESOPHAGOGASTRODUODENOSCOPY  02/14/2018   +EUS: Chronic active gastritis, + H PYLORI, tx'd with abx by GI, stool antigen test NEG after abx (04/2018)  . EUS N/A 02/14/2018   Procedure: UPPER ENDOSCOPIC ULTRASOUND (EUS) LINEAR;  Surgeon: Milus Banister, MD;  Location: [facility];  Service: Endoscopy;  Laterality: N/A;  . HOT HEMOSTASIS N/A 11/25/2018   Procedure: HOT HEMOSTASIS (ARGON PLASMA COAGULATION/BICAP);  Surgeon: Irving Copas., MD;  Location: [facility];  Service: Gastroenterology;  Laterality: N/A;  . KNEE SURGERY Left 2000 and 2017   arthroscopic  . LUMBAR SPINE SURGERY  2000   No hardware  . NASAL SEPTUM SURGERY  1981  . POLYPECTOMY    . SUBMUCOSAL INJECTION  11/25/2018   Procedure: SUBMUCOSAL INJECTION;  Surgeon: Rush Landmark Telford Nab., MD;  Location: [facility];  Service: Gastroenterology;;  . [place]  . UPPER GASTROINTESTINAL ENDOSCOPY      Current Outpatient Medications  Medication Sig Dispense Refill  . Alcaftadine (LASTACAFT) 0.25 % SOLN Apply 1 drop to eye daily.     . carboxymethylcellulose (REFRESH PLUS) 0.5 % SOLN Place 1 drop into both eyes 3 (three) times daily as needed (dry eyes).    . clobetasol cream (TEMOVATE) 5.36 % Apply 1 application topically daily as needed (dry skin).    . Coenzyme Q10 (CO Q-10 PO) Take 10 mLs by mouth daily.    . Misc Natural Products (PROSTATE HEALTH PO) Take 1 capsule by mouth daily.    . Omega-3 Fatty Acids (FISH OIL) 1000 MG CAPS Take 1 capsule by mouth daily.     Marland Kitchen OVER THE COUNTER MEDICATION Take 1 capsule by mouth daily. BioAstin [place] Astaxanthin     . Salicylic Acid 3 % OINT Apply 1 application topically daily as needed  (for remove dry, dead skin).     Marland Kitchen testosterone cypionate (DEPOTESTOSTERONE CYPIONATE) 200 MG/ML injection Inject 60 mg into the muscle every Thursday.   1  . TURMERIC PO Take 15 mLs by mouth daily. 15 ml daily      No current facility-administered medications for this visit.     Allergies as of 04/23/2019 - Review Complete 04/23/2019  Allergen Reaction Noted  . Levofloxacin Itching and Other (See Comments) 12/23/2002    Family History  Problem Relation Age of Onset  . Arthritis Mother   . Depression Mother   . Alzheimer's disease Mother   . Alcohol abuse Father   . Arthritis Father   . COPD Father   . Hearing loss Father   . Heart disease Father   . Colon cancer Sister 28  . Colon cancer Paternal Aunt   . Colon cancer Cousin   . Colon polyps Neg Hx   . Esophageal cancer Neg Hx   . Rectal cancer Neg Hx   . Stomach cancer Neg Hx  Social History   Socioeconomic History  . Marital status: Married    Spouse name: Not on file  . Number of children: Not on file  . Years of education: Not on file  . Highest education level: Not on file  Occupational History  . Not on file  Social Needs  . Financial resource strain: Not on file  . Food insecurity:    Worry: Not on file    Inability: Not on file  . Transportation needs:    Medical: Not on file    Non-medical: Not on file  Tobacco Use  . Smoking status: Former Smoker    Packs/day: 1.50    Years: 3.00    Pack years: 4.50    Types: Cigarettes    Last attempt to quit: 12/11/1976    Years since quitting: 42.3  . Smokeless tobacco: Never Used  Substance and Sexual Activity  . Alcohol use: No  . Drug use: No  . Sexual activity: Not on file  Lifestyle  . Physical activity:    Days per week: Not on file    Minutes per session: Not on file  . Stress: Not on file  Relationships  . Social connections:    Talks on phone: Not on file    Gets together: Not on file    Attends religious service: Not on file    Active  member of club or organization: Not on file    Attends meetings of clubs or organizations: Not on file    Relationship status: Not on file  . Intimate partner violence:    Fear of current or ex partner: Not on file    Emotionally abused: Not on file    Physically abused: Not on file    Forced sexual activity: Not on file  Other Topics Concern  . Not on file  Social History Narrative  . Not on file     Physical Exam: Unable to perform because this was a "telemed visit" due to current Covid-19 pandemic  Assessment and plan: 63 y.o. male with personal history of adenomatous colon polyps  I piecemeal resected a 17 mm a sending colon polyp in December 2019.  He understands the rationale for repeat surveillance colonoscopy to make sure that no adenomatous mucosa was left behind and we will arrange that procedure for him to be done in the next week or 2.  I see no reason for any further blood tests or imaging studies prior to then.  Please see the "Patient Instructions" section for addition details about the plan.  Jordan Loffler, MD [facility] 04/23/2019, 3:25 PM

## 2019-04-30 ENCOUNTER — Telehealth: Payer: Self-pay | Admitting: *Deleted

## 2019-04-30 NOTE — Telephone Encounter (Signed)
Covid-19 travel screening questions  Have you traveled in the last 14 days? No If yes where?  Do you now or have you had a fever in the last 14 days? No  Do you have any respiratory symptoms of shortness of breath or cough now or in the last 14 days? No  Do you have any family members or close contacts with diagnosed or suspected Covid-19? No  Patient aware that care partner is to remain in car in parking lot and is to wear a mask into the building.

## 2019-05-02 ENCOUNTER — Encounter: Payer: Self-pay | Admitting: Gastroenterology

## 2019-05-02 ENCOUNTER — Ambulatory Visit (AMBULATORY_SURGERY_CENTER): Payer: BLUE CROSS/BLUE SHIELD | Admitting: Gastroenterology

## 2019-05-02 ENCOUNTER — Other Ambulatory Visit: Payer: Self-pay

## 2019-05-02 VITALS — BP 128/81 | HR 83 | Temp 98.9°F | Resp 20 | Ht 71.0 in | Wt 250.0 lb

## 2019-05-02 DIAGNOSIS — D122 Benign neoplasm of ascending colon: Secondary | ICD-10-CM

## 2019-05-02 DIAGNOSIS — K5140 Inflammatory polyps of colon without complications: Secondary | ICD-10-CM

## 2019-05-02 DIAGNOSIS — D126 Benign neoplasm of colon, unspecified: Secondary | ICD-10-CM

## 2019-05-02 MED ORDER — SODIUM CHLORIDE 0.9 % IV SOLN: 500.0000 mL | Freq: Once | INTRAVENOUS | Status: DC

## 2019-05-02 NOTE — Progress Notes (Signed)
PT taken to PACU. Monitors in place. VSS. Report given to RN. 

## 2019-05-02 NOTE — Patient Instructions (Signed)
Discharge instructions given. Handout on Hemorrhoids. Resume previous medications. YOU HAD AN ENDOSCOPIC PROCEDURE TODAY AT [facility]:   Refer to the procedure report that was given to you for any specific questions about what was found during the examination.  If the procedure report does not answer your questions, please call your gastroenterologist to clarify.  If you requested that your care partner not be given the details of your procedure findings, then the procedure report has been included in a sealed envelope for you to review at your convenience later.  YOU SHOULD EXPECT: Some feelings of bloating in the abdomen. Passage of more gas than usual.  Walking can help get rid of the air that was put into your GI tract during the procedure and reduce the bloating. If you had a lower endoscopy (such as a colonoscopy or flexible sigmoidoscopy) you may notice spotting of blood in your stool or on the toilet paper. If you underwent a bowel prep for your procedure, you may not have a normal bowel movement for a few days.  Please Note:  You might notice some irritation and congestion in your nose or some drainage.  This is from the oxygen used during your procedure.  There is no need for concern and it should clear up in a day or so.  SYMPTOMS TO REPORT IMMEDIATELY:   Following lower endoscopy (colonoscopy or flexible sigmoidoscopy):  Excessive amounts of blood in the stool  Significant tenderness or worsening of abdominal pains  Swelling of the abdomen that is new, acute  Fever of 100F or higher  For urgent or emergent issues, a gastroenterologist can be reached at any hour by calling [phone].   DIET:  We do recommend a small meal at first, but then you may proceed to your regular diet.  Drink plenty of fluids but you should avoid alcoholic beverages for 24 hours.  ACTIVITY:  You should plan to take it easy for the rest of today and you should NOT DRIVE or use heavy  machinery until tomorrow (because of the sedation medicines used during the test).    FOLLOW UP: Our staff will call the number listed on your records 48-72 hours following your procedure to check on you and address any questions or concerns that you may have regarding the information given to you following your procedure. If we do not reach you, we will leave a message.  We will attempt to reach you two times.  During this call, we will ask if you have developed any symptoms of COVID 19. If you develop any symptoms (ie: fever, flu-like symptoms, shortness of breath, cough etc.) before then, please call [phone].  If you test positive for Covid 19 in the 2 weeks post procedure, please call and report this information to [place].    If any biopsies were taken you will be contacted by phone or by letter within the next 1-3 weeks.  Please call us at [phone] if you have not heard about the biopsies in 3 weeks.    SIGNATURES/CONFIDENTIALITY: You and/or your care partner have signed paperwork which will be entered into your electronic medical record.  These signatures attest to the fact that that the information above on your After Visit Summary has been reviewed and is understood.  Full responsibility of the confidentiality of this discharge information lies with you and/or your care-partner.

## 2019-05-02 NOTE — Op Note (Signed)
[place] Patient Name: Jordan Berg Procedure Date: 05/02/2019 3:22 PM MRN: [id number] Endoscopist: Milus Banister , MD Age: 63 Referring MD:  Date of Birth: [birth year] Gender: Male Account #: [id number] Procedure:                Colonoscopy Indications:              High risk colon cancer surveillance: Personal                            history of colonic polyps; colonoscopy Dr. Ardis Hughs                            2054found no precancerous polyps, he did have one                            polyp that was removed it was a ganglioneuroma, my                            review of the literature showed that this would not                            increase his chance of colon cancer and did not                            necessitate sooner screening colonoscopy. He was                            recommended to have repeat screening at 10-year                            interval.Colonoscopy December 2019Dr. Ardis Hughs                            found for polyps in the colon, all were removed.                            All were adenomatous on pathology. The largest was                            17 mm, located in the ascending colon, removed with                            piecemeal resection, apparently completely. (recall                            recommended at 6 month interval). He suffered post                            polypectomy bleeding and 3 days later underwent                            repeat colonoscopy. A visible vessel is noted in  the ascending colon site, it was treated with                            epinephrine, BiCAP cauteryand 9 endoscopic clips                            were placed. His hemoglobin never dropped below                            normal values Medicines:                Monitored Anesthesia Care Procedure:                Pre-Anesthesia Assessment:                           - Prior to the  procedure, a History and Physical                            was performed, and patient medications and                            allergies were reviewed. The patient's tolerance of                            previous anesthesia was also reviewed. The risks                            and benefits of the procedure and the sedation                            options and risks were discussed with the patient.                            All questions were answered, and informed consent                            was obtained. Prior Anticoagulants: The patient has                            taken no previous anticoagulant or antiplatelet                            agents. ASA Grade Assessment: II - A patient with                            mild systemic disease. After reviewing the risks                            and benefits, the patient was deemed in                            satisfactory condition to undergo the procedure.  After obtaining informed consent, the colonoscope                            was passed under direct vision. Throughout the                            procedure, the patient's blood pressure, pulse, and                            oxygen saturations were monitored continuously. The                            Colonoscope was introduced through the anus and                            advanced to the the cecum, identified by                            appendiceal orifice and ileocecal valve. The                            colonoscopy was performed without difficulty. The                            patient tolerated the procedure well. The quality                            of the bowel preparation was good. The ileocecal                            valve, appendiceal orifice, and rectum were                            photographed. Scope In: 3:37:50 PM Scope Out: 3:49:45 PM Scope Withdrawal Time: 0 hours 8 minutes 48 seconds  Total Procedure  Duration: 0 hours 11 minutes 55 seconds  Findings:                 The site of the 11/2018 ascending colon polypectomy                            was easily located, 4 of the previously placed                            endoclips were still adherent (3 overlapping clips                            and 1 clip about 1cm proximal to that site). The                            mucosa was slightly irregular at the site of the                            clips; granulation tissue vs. foreign body (  clip)                            reaction vs. residual/recurrent neoplasia (which I                            think is less likely). This was sampled with cold                            snare.                           Internal hemorrhoids were found. The hemorrhoids                            were medium-sized.                           The exam was otherwise without abnormality on                            direct and retroflexion views. Complications:            No immediate complications. Estimated blood loss:                            None. Estimated Blood Loss:     Estimated blood loss: none. Impression:               - The site of the 11/2018 ascending colon                            polypectomy was easily located, 4 of the previously                            placed endoclips were still adherent (3 overlapping                            clips and 1 clip about 1cm proximal to that site).                            The mucosa was slightly irregular at the site of                            the clips; granulation tissue vs. foreign body                            (clip) reaction vs. residual/recurrent neoplasia                            (which I think is less likely). This was sampled                            with cold snare.                           -  Internal hemorrhoids.                           - The examination was otherwise normal on direct                            and retroflexion  views. Recommendation:           - Patient has a contact number available for                            emergencies. The signs and symptoms of potential                            delayed complications were discussed with the                            patient. Return to normal activities tomorrow.                            Written discharge instructions were provided to the                            patient.                           - Resume previous diet.                           - Continue present medications.                           You will receive a letter within 2-3 weeks with the                            pathology results and my final recommendations.                            Likely repeat colonoscopy in 3 years. Milus Banister, MD 05/02/2019 3:57:32 PM This report has been signed electronically.

## 2019-05-02 NOTE — Progress Notes (Signed)
Pt's states no medical or surgical changes since previsit or office visit. 

## 2019-05-02 NOTE — Progress Notes (Signed)
Called to room to assist during endoscopic procedure.  Patient ID and intended procedure confirmed with present staff. Received instructions for my participation in the procedure from the performing physician.  

## 2019-05-02 NOTE — Progress Notes (Signed)
Temperature taken by [facility], CMA, VS taken by Rica Mote, CMA

## 2019-05-03 HISTORY — PX: COLONOSCOPY: SHX174

## 2019-05-06 ENCOUNTER — Encounter: Payer: Self-pay | Admitting: Family Medicine

## 2019-05-06 ENCOUNTER — Telehealth: Payer: Self-pay | Admitting: *Deleted

## 2019-05-06 NOTE — Telephone Encounter (Signed)
  Follow up Call-  Call back number 05/02/2019 11/22/2018  Post procedure Call Back phone  # [phone]  Permission to leave phone message Yes Yes  Some recent data might be hidden     Patient questions:  Do you have a fever, pain , or abdominal swelling? No. Pain Score  0 *  Have you tolerated food without any problems? Yes.    Have you been able to return to your normal activities? Yes.    Do you have any questions about your discharge instructions: Diet   No. Medications  No. Follow up visit  No.  Do you have questions or concerns about your Care? No.  Actions: * If pain score is 4 or above: No action needed, pain <4.  1. Have you developed a fever since your procedure? no  2.   Have you had an respiratory symptoms (SOB or cough) since your procedure? no  3.   Have you tested positive for COVID 19 since your procedure no  4.   Have you had any family members/close contacts diagnosed with the COVID 19 since your procedure?  no   If any of these questions are a yes, please inquire if patient has been seen by family doctor and route this note to Joylene Yahye, Therapist, sports.

## 2019-05-08 ENCOUNTER — Encounter: Payer: Self-pay | Admitting: Gastroenterology

## 2019-08-06 ENCOUNTER — Encounter: Payer: Self-pay | Admitting: Family Medicine

## 2019-10-31 LAB — PSA: PSA: 1.92

## 2019-11-18 ENCOUNTER — Encounter: Payer: Self-pay | Admitting: Family Medicine

## 2019-12-09 ENCOUNTER — Encounter: Payer: Self-pay | Admitting: Family Medicine

## 2020-01-30 ENCOUNTER — Ambulatory Visit (INDEPENDENT_AMBULATORY_CARE_PROVIDER_SITE_OTHER): Payer: Medicare Other | Admitting: Family Medicine

## 2020-01-30 ENCOUNTER — Encounter: Payer: Self-pay | Admitting: Family Medicine

## 2020-01-30 ENCOUNTER — Other Ambulatory Visit: Payer: Self-pay

## 2020-01-30 ENCOUNTER — Encounter: Payer: BLUE CROSS/BLUE SHIELD | Admitting: Family Medicine

## 2020-01-30 VITALS — BP 100/61 | HR 82 | Temp 98.6°F | Resp 16 | Ht 71.0 in | Wt 254.8 lb

## 2020-01-30 DIAGNOSIS — Z125 Encounter for screening for malignant neoplasm of prostate: Secondary | ICD-10-CM

## 2020-01-30 DIAGNOSIS — Z0000 Encounter for general adult medical examination without abnormal findings: Secondary | ICD-10-CM | POA: Diagnosis not present

## 2020-01-30 DIAGNOSIS — Z23 Encounter for immunization: Secondary | ICD-10-CM

## 2020-01-30 DIAGNOSIS — E291 Testicular hypofunction: Secondary | ICD-10-CM

## 2020-01-30 DIAGNOSIS — E7800 Pure hypercholesterolemia, unspecified: Secondary | ICD-10-CM

## 2020-01-30 NOTE — Addendum Note (Signed)
Addended by: Deveron Furlong D on: 01/30/2020 02:45 PM   Modules accepted: Orders

## 2020-01-30 NOTE — Progress Notes (Signed)
Office Note 01/30/2020  CC:  Chief Complaint  Patient presents with  . Annual Exam    pt is fasting    HPI:  Jordan Berg is a 64 y.o. White male who is here for annual health maintenance exam.   Past Medical History:  Diagnosis Date  . Arthritis    mild  . Cataract    forming  . Colon polyps 01/20/2009; 11/24/18; 04/2019   2010: NON-adenomatous-recall 10 yrs.  11/2018->adenomatous. repeat 05/03/19 mo b/c of piecemeal resection of one of the polyps->small area at 11/2018 polypectomy site bx'd->benign.  +Hx of postpolypectomy bleeding-->colonoscopy was done 3d after 11/2018 polypectomy to fix this.  . Fatty liver    Noted on EUS  . GERD (gastroesophageal reflux disease)    mild  . Helicobacter pylori gastritis 02/2018   EGD+.  Stool antigen NEG after abx treatment.  . Hepatic steatosis    Noted on MRI abd 12/2017 (done for RUQ pain)  . Hypercholesterolemia 12/2017   Recommended statin 12/2017: pt chose TLC and repeat chol 3 mo.  . Kidney stone   . Obesity, Class II, BMI 35-39.9   . Primary hypogonadism in male    Managed by pt's urologist as of 12/2017 (including biannual DREs and PSAs).  . RUQ abdominal pain Dec 2018;Jan 2019   Labs nl, abd plain films nl, MRCP nl.  GI did EUS 2019, normal except chronic gastritis, + H pylori.  Stool antigen test NEG after abx tx.    Past Surgical History:  Procedure Laterality Date  . CHOLECYSTECTOMY  2010  . COLONOSCOPY    . COLONOSCOPY N/A 11/25/2018   Adenomatous: recall 6 mo due to piecemeal resection of one of the polyps.  Procedure: COLONOSCOPY;  Surgeon: Rush Landmark Telford Nab., MD;  Location: [facility];  Service: Gastroenterology;  Laterality: N/A;  . COLONOSCOPY  11/28/2018   for post polypectomy bleeding.  Vessel cauterized/clipped  . COLONOSCOPY  05/03/2019   f/u view of polypectomy site from 11/2018 was done due to piecemeal resection. Bx ->inflam/benign. Recall 3 yrs.  . COLONOSCOPY W/ POLYPECTOMY  01/20/2009   Multiple nonadenomatous polyps.  Recall 10 yrs.  . ESOPHAGOGASTRODUODENOSCOPY  02/14/2018   +EUS: Chronic active gastritis, + H PYLORI, tx'd with abx by GI, stool antigen test NEG after abx (04/2018)  . EUS N/A 02/14/2018   Procedure: UPPER ENDOSCOPIC ULTRASOUND (EUS) LINEAR;  Surgeon: Milus Banister, MD;  Location: [facility];  Service: Endoscopy;  Laterality: N/A;  . HOT HEMOSTASIS N/A 11/25/2018   Procedure: HOT HEMOSTASIS (ARGON PLASMA COAGULATION/BICAP);  Surgeon: Irving Copas., MD;  Location: [facility];  Service: Gastroenterology;  Laterality: N/A;  . KNEE SURGERY Left 2000 and 2017   arthroscopic  . LUMBAR SPINE SURGERY  2000   No hardware  . NASAL SEPTUM SURGERY  1981  . POLYPECTOMY    . SUBMUCOSAL INJECTION  11/25/2018   Procedure: SUBMUCOSAL INJECTION;  Surgeon: Rush Landmark Telford Nab., MD;  Location: [facility];  Service: Gastroenterology;;  . [place]  . UPPER GASTROINTESTINAL ENDOSCOPY      Family History  Problem Relation Age of Onset  . Arthritis Mother   . Depression Mother   . Alzheimer's disease Mother   . Alcohol abuse Father   . Arthritis Father   . COPD Father   . Hearing loss Father   . Heart disease Father   . Colon cancer Sister 38  . Colon cancer Paternal Aunt   . Colon cancer Cousin   . Colon  polyps Neg Hx   . Esophageal cancer Neg Hx   . Rectal cancer Neg Hx   . Stomach cancer Neg Hx     Social History   Socioeconomic History  . Marital status: Married    Spouse name: Not on file  . Number of children: Not on file  . Years of education: Not on file  . Highest education level: Not on file  Occupational History  . Not on file  Tobacco Use  . Smoking status: Former Smoker    Packs/day: 1.50    Years: 3.00    Pack years: 4.50    Types: Cigarettes    Quit date: 12/11/1976    Years since quitting: 43.1  . Smokeless tobacco: Never Used  Substance and Sexual Activity  . Alcohol use: No  . Drug use: No  . Sexual  activity: Not on file  Other Topics Concern  . Not on file  Social History Narrative  . Not on file   Social Determinants of Health   Financial Resource Strain:   . Difficulty of Paying Living Expenses: Not on file  Food Insecurity:   . Worried About Charity fundraiser in the Last Year: Not on file  . Ran Out of Food in the Last Year: Not on file  Transportation Needs:   . Lack of Transportation (Medical): Not on file  . Lack of Transportation (Non-Medical): Not on file  Physical Activity:   . Days of Exercise per Week: Not on file  . Minutes of Exercise per Session: Not on file  Stress:   . Feeling of Stress : Not on file  Social Connections:   . Frequency of Communication with Friends and Family: Not on file  . Frequency of Social Gatherings with Friends and Family: Not on file  . Attends Religious Services: Not on file  . Active Member of Clubs or Organizations: Not on file  . Attends Archivist Meetings: Not on file  . Marital Status: Not on file  Intimate Partner Violence:   . Fear of Current or Ex-Partner: Not on file  . Emotionally Abused: Not on file  . Physically Abused: Not on file  . Sexually Abused: Not on file    Outpatient Medications Prior to Visit  Medication Sig Dispense Refill  . Alcaftadine (LASTACAFT) 0.25 % SOLN Apply 1 drop to eye daily.     . carboxymethylcellulose (REFRESH PLUS) 0.5 % SOLN Place 1 drop into both eyes 3 (three) times daily as needed (dry eyes).    . clobetasol cream (TEMOVATE) [id number] % Apply 1 application topically daily as needed (dry skin).    . Coenzyme Q10 (CO Q-10 PO) Take 10 mLs by mouth daily.    . Misc Natural Products (PROSTATE HEALTH PO) Take 1 capsule by mouth daily.    . Omega-3 Fatty Acids (FISH OIL) 1000 MG CAPS Take 1 capsule by mouth daily.     . Salicylic Acid 3 % OINT Apply 1 application topically daily as needed (for remove dry, dead skin).     Marland Kitchen testosterone cypionate (DEPOTESTOSTERONE CYPIONATE) 200  MG/ML injection Inject 60 mg into the muscle every Thursday.   1  . TURMERIC PO Take 15 mLs by mouth daily. 15 ml daily     . B-D 3CC LUER-LOK SYR 22GX1-1/2 22G X 1-1/2" 3 ML MISC USE AS DIRECTED FOR TESTOSTERONE INJECTIONS    . OVER THE COUNTER MEDICATION Take 1 capsule by mouth daily. BioAstin [place] Astaxanthin  No facility-administered medications prior to visit.    Allergies  Allergen Reactions  . Levofloxacin Itching and Other (See Comments)    [place]   ROS Review of Systems  Constitutional: Negative for appetite change, chills, fatigue and fever.  HENT: Negative for congestion, dental problem, ear pain and sore throat.   Eyes: Negative for discharge, redness and visual disturbance.  Respiratory: Negative for cough, chest tightness, shortness of breath and wheezing.   Cardiovascular: Negative for chest pain, palpitations and leg swelling.  Gastrointestinal: Negative for abdominal pain, blood in stool, diarrhea, nausea and vomiting.  Genitourinary: Negative for difficulty urinating, dysuria, flank pain, frequency, hematuria and urgency.  Musculoskeletal: Negative for arthralgias, back pain, joint swelling, myalgias and neck stiffness.  Skin: Negative for pallor and rash.  Neurological: Negative for dizziness, speech difficulty, weakness and headaches.  Hematological: Negative for adenopathy. Does not bruise/bleed easily.  Psychiatric/Behavioral: Negative for confusion and sleep disturbance. The patient is not nervous/anxious.     PE; Blood pressure 100/61, pulse 82, temperature 98.6 F (37 C), temperature source Temporal, resp. rate 16, height 5\' 11"  (1.803 m), weight 254 lb 12.8 oz (115.6 kg), SpO2 93 %. Body mass index is 35.54 kg/m.  Gen: Alert, well appearing.  Patient is oriented to person, place, time, and situation. AFFECT: pleasant, lucid thought and speech. ENT: Ears: EACs clear, normal epithelium.  TMs with good light reflex and landmarks bilaterally.  Eyes:  no injection, icteris, swelling, or exudate.  EOMI, PERRLA. Nose: no drainage or turbinate edema/swelling.  No injection or focal lesion.  Mouth: lips without lesion/swelling.  Oral mucosa pink and moist.  Dentition intact and without obvious caries or gingival swelling.  Oropharynx without erythema, exudate, or swelling.  Neck: supple/nontender.  No LAD, mass, or TM.  Carotid pulses 2+ bilaterally, without bruits. CV: RRR, no m/r/g.   LUNGS: CTA bilat, nonlabored resps, good aeration in all lung fields. ABD: soft, NT, ND, BS normal.  No hepatospenomegaly or mass.  No bruits. EXT: no clubbing, cyanosis, or edema.  Musculoskeletal: no joint swelling, erythema, warmth, or tenderness.  ROM of all joints intact. Skin - no sores or suspicious lesions or rashes or color changes   Pertinent labs:  No results found for: TSH Lab Results  Component Value Date   WBC 7.1 11/26/2018   HGB 15.8 11/26/2018   HCT 48.6 11/26/2018   MCV 94.2 11/26/2018   PLT 191 11/26/2018   Lab Results  Component Value Date   CREATININE 1.16 11/25/2018   BUN 17 11/25/2018   NA 138 11/25/2018   K 4.1 11/25/2018   CL 106 11/25/2018   CO2 24 11/25/2018   Lab Results  Component Value Date   ALT 28 11/24/2018   AST 23 11/24/2018   ALKPHOS 37 (L) 11/24/2018   BILITOT 1.2 11/24/2018   Lab Results  Component Value Date   CHOL 187 12/27/2018   Lab Results  Component Value Date   HDL 36.90 (L) 12/27/2018   Lab Results  Component Value Date   LDLCALC 126 (H) 12/27/2018   Lab Results  Component Value Date   TRIG 122.0 12/27/2018   Lab Results  Component Value Date   CHOLHDL 5 12/27/2018   Lab Results  Component Value Date   PSA 1.09 11/20/2018    ASSESSMENT AND PLAN:   Health maintenance exam: Reviewed age and gender appropriate health maintenance issues (prudent diet, regular exercise, health risks of tobacco and excessive alcohol, use of seatbelts, fire alarms in home, use  of sunscreen).  Also  reviewed age and gender appropriate health screening as well as vaccine recommendations. Vaccines: Flu-->UTD.  Pneumovax 23-->will give today. Labs: fasting CBC, CMET, FLP today. Prostate ca screening: DRE and PSA followed by urologist (as is his testosterone). Colon ca screening: hx polyps, next colonoscopy due 2023.  Male hypogonadism: -->managed by urologist.  An After Visit Summary was printed and given to the patient.  FOLLOW UP:  No follow-ups on file.  Signed:  Crissie Sickles, MD           01/30/2020

## 2020-01-30 NOTE — Patient Instructions (Signed)

## 2020-01-31 LAB — CBC WITH DIFFERENTIAL/PLATELET
Absolute Monocytes: 669 cells/uL (ref 200–950)
Basophils Absolute: 91 cells/uL (ref 0–200)
Basophils Relative: 1.2 %
Eosinophils Absolute: 448 cells/uL (ref 15–500)
Eosinophils Relative: 5.9 %
HCT: 47.9 % (ref 38.5–50.0)
Hemoglobin: 16 g/dL (ref 13.2–17.1)
Lymphs Abs: 2014 cells/uL (ref 850–3900)
MCH: 30.7 pg (ref 27.0–33.0)
MCHC: 33.4 g/dL (ref 32.0–36.0)
MCV: 91.8 fL (ref 80.0–100.0)
MPV: 11.8 fL (ref 7.5–12.5)
Monocytes Relative: 8.8 %
Neutro Abs: 4378 cells/uL (ref 1500–7800)
Neutrophils Relative %: 57.6 %
Platelets: 217 10*3/uL (ref 140–400)
RBC: 5.22 10*6/uL (ref 4.20–5.80)
RDW: 12.8 % (ref 11.0–15.0)
Total Lymphocyte: 26.5 %
WBC: 7.6 10*3/uL (ref 3.8–10.8)

## 2020-01-31 LAB — COMPREHENSIVE METABOLIC PANEL
AG Ratio: 1.8 (calc) (ref 1.0–2.5)
ALT: 27 U/L (ref 9–46)
AST: 28 U/L (ref 10–35)
Albumin: 4.4 g/dL (ref 3.6–5.1)
Alkaline phosphatase (APISO): 39 U/L (ref 35–144)
BUN: 15 mg/dL (ref 7–25)
CO2: 25 mmol/L (ref 20–32)
Calcium: 9.3 mg/dL (ref 8.6–10.3)
Chloride: 103 mmol/L (ref 98–110)
Creat: 1.15 mg/dL (ref 0.70–1.25)
Globulin: 2.4 g/dL (calc) (ref 1.9–3.7)
Glucose, Bld: 84 mg/dL (ref 65–99)
Potassium: 4.4 mmol/L (ref 3.5–5.3)
Sodium: 138 mmol/L (ref 135–146)
Total Bilirubin: 0.8 mg/dL (ref 0.2–1.2)
Total Protein: 6.8 g/dL (ref 6.1–8.1)

## 2020-01-31 LAB — LIPID PANEL
Cholesterol: 184 mg/dL (ref <200)
HDL: 39 mg/dL — ABNORMAL LOW (ref >=40)
LDL Cholesterol (Calc): 125 mg/dL (calc) — ABNORMAL HIGH
Non-HDL Cholesterol (Calc): 145 mg/dL (calc) — ABNORMAL HIGH (ref <130)
Total CHOL/HDL Ratio: 4.7 (calc) (ref <5.0)
Triglycerides: 94 mg/dL (ref <150)

## 2020-02-02 ENCOUNTER — Encounter: Payer: Self-pay | Admitting: Family Medicine

## 2020-02-17 ENCOUNTER — Encounter: Payer: BC Managed Care – PPO | Admitting: Family Medicine

## 2020-03-31 DIAGNOSIS — M26622 Arthralgia of left temporomandibular joint: Secondary | ICD-10-CM | POA: Diagnosis not present

## 2020-10-22 DIAGNOSIS — R948 Abnormal results of function studies of other organs and systems: Secondary | ICD-10-CM | POA: Diagnosis not present

## 2020-10-22 LAB — PSA: PSA: 2.07

## 2020-12-14 ENCOUNTER — Encounter: Payer: Self-pay | Admitting: Family Medicine

## 2021-01-31 ENCOUNTER — Telehealth: Payer: Self-pay | Admitting: Family Medicine

## 2021-01-31 NOTE — Telephone Encounter (Signed)
Spoke with patient he requested a call back on 02/01/21

## 2021-02-08 ENCOUNTER — Telehealth: Payer: Self-pay | Admitting: Family Medicine

## 2021-02-08 NOTE — Telephone Encounter (Signed)
Spoke with patient he declined the AWV, did not understand after explaining why for the AWV.  He will have a physical with pcp for him to explain the AWV

## 2021-09-18 ENCOUNTER — Emergency Department
Admission: EM | Admit: 2021-09-18 | Discharge: 2021-09-18 | Disposition: A | Discharge status: Home / Self Care | Payer: Medicare Other | Source: Home / Self Care | Attending: Family Medicine | Admitting: Family Medicine

## 2021-09-18 ENCOUNTER — Encounter: Payer: Self-pay | Admitting: Emergency Medicine

## 2021-09-18 ENCOUNTER — Other Ambulatory Visit: Payer: Self-pay

## 2021-09-18 ENCOUNTER — Emergency Department: Admit: 2021-09-18 | Payer: Self-pay

## 2021-09-18 DIAGNOSIS — M7022 Olecranon bursitis, left elbow: Secondary | ICD-10-CM

## 2021-09-18 NOTE — Discharge Instructions (Signed)
Ice or heat to area Take ibuprofen 600 mg 3 times a day with food Consider getting a compression sleeve Follow-up as needed

## 2021-09-18 NOTE — ED Provider Notes (Deleted)
  The note originally documented on this encounter has been moved the the encounter in which it belongs.  

## 2021-09-18 NOTE — ED Triage Notes (Signed)
Patient presents to Urgent Care with complaints of left elbow swelling since 1 week ago. Patient reports swelling of the left elbow. Does not have any pain. Does have some burning sensation. [place]

## 2021-09-18 NOTE — Discharge Instructions (Addendum)
May take ibuprofen 600 mg up to 3 X a day with food Use warmth to area Call or return for problems

## 2021-09-18 NOTE — ED Provider Notes (Signed)
[name]    CSN: [id number] Arrival date & time:         History   Chief Complaint No chief complaint on file.   HPI Jordan Berg is a 65 y.o. male.   HPI  Swelling of left elbow for a week.  No pain.  Full use.  Full range of motion.  Does not recall trauma. No history of gout or arthritis  Past Medical History:  Diagnosis Date   Arthritis    mild   Cataract    forming   Colon polyps 01/20/2009; 11/24/18; 04/2019   2010: NON-adenomatous-recall 10 yrs.  11/2018->adenomatous. repeat 05/03/19 mo b/c of piecemeal resection of one of the polyps->small area at 11/2018 polypectomy site bx'd->benign.  +Hx of postpolypectomy bleeding-->colonoscopy was done 3d after 11/2018 polypectomy to fix this.   GERD (gastroesophageal reflux disease)    mild   Helicobacter pylori gastritis 02/2018   EGD+.  Stool antigen NEG after abx treatment.   Hepatic steatosis    Noted on MRI abd 12/2017 (done for RUQ pain). also noted on EUS.   Hypercholesterolemia 12/2017   Recommended statin 12/2017: pt chose [facility] and repeat chol 3 mo. Recommended statin 01/2020.   Kidney stone    Obesity, Class II, BMI 35-39.9    Primary hypogonadism in male    Managed by pt's urologist (including biannual DREs, Hbs and PSAs).   RUQ abdominal pain Dec 2018;Jan 2019   Labs nl, abd plain films nl, MRCP nl.  GI did EUS 2019, normal except chronic gastritis, + H pylori.  Stool antigen test NEG after abx tx.    Patient Active Problem List   Diagnosis Date Noted   Acute lower GI bleeding 11/24/2018   Personal history of colonic polyps 11/24/2018   GI bleeding 11/24/2018   Gastritis and gastroduodenitis    Hypercholesterolemia 12/11/2017   CHOLELITHIASIS 12/28/2008   RUQ pain 12/25/2008   ABDOMINAL PAIN 12/24/2008    Past Surgical History:  Procedure Laterality Date   CHOLECYSTECTOMY  2010   COLONOSCOPY     COLONOSCOPY N/A 11/25/2018   Adenomatous: recall 6 mo due to piecemeal resection of  one of the polyps.  Procedure: COLONOSCOPY;  Surgeon: Rush Landmark Telford Nab., MD;  Location: [facility];  Service: Gastroenterology;  Laterality: N/A;   COLONOSCOPY  11/28/2018   for post polypectomy bleeding.  Vessel cauterized/clipped   COLONOSCOPY  05/03/2019   f/u view of polypectomy site from 11/2018 was done due to piecemeal resection. Bx ->inflam/benign. Recall 3 yrs.   COLONOSCOPY W/ POLYPECTOMY  01/20/2009   Multiple nonadenomatous polyps.  Recall 10 yrs.   ESOPHAGOGASTRODUODENOSCOPY  02/14/2018   +EUS: Chronic active gastritis, + H PYLORI, tx'd with abx by GI, stool antigen test NEG after abx (04/2018)   EUS N/A 02/14/2018   Procedure: UPPER ENDOSCOPIC ULTRASOUND (EUS) LINEAR;  Surgeon: Milus Banister, MD;  Location: [facility];  Service: Endoscopy;  Laterality: N/A;   HOT HEMOSTASIS N/A 11/25/2018   Procedure: HOT HEMOSTASIS (ARGON PLASMA COAGULATION/BICAP);  Surgeon: Irving Copas., MD;  Location: [facility];  Service: Gastroenterology;  Laterality: N/A;   KNEE SURGERY Left 2000 and 2017   arthroscopic   LUMBAR SPINE SURGERY  2000   No hardware   NASAL SEPTUM SURGERY  1981   POLYPECTOMY     SUBMUCOSAL INJECTION  11/25/2018   Procedure: SUBMUCOSAL INJECTION;  Surgeon: Irving Copas., MD;  Location: [facility];  Service: Gastroenterology;;   [place]  GASTROINTESTINAL ENDOSCOPY         Home Medications    Prior to Admission medications   Medication Sig Start Date End Date Taking? Authorizing Provider  Alcaftadine 0.25 % SOLN Apply 1 drop to eye daily.     [provider]  B-D 3CC LUER-LOK SYR 22GX1-1/2 22G X 1-1/2" 3 ML MISC USE AS DIRECTED FOR TESTOSTERONE INJECTIONS 01/06/20   [provider]  carboxymethylcellulose (REFRESH PLUS) 0.5 % SOLN Place 1 drop into both eyes 3 (three) times daily as needed (dry eyes).    [provider]  clobetasol cream (TEMOVATE) 1.61 % Apply 1 application topically daily  as needed (dry skin).    [provider]  Coenzyme Q10 (CO Q-10 PO) Take 10 mLs by mouth daily.    [provider]  Misc Natural Products (PROSTATE HEALTH PO) Take 1 capsule by mouth daily.    [provider]  Omega-3 Fatty Acids (FISH OIL) 1000 MG CAPS Take 1 capsule by mouth daily.     [provider]  OVER THE COUNTER MEDICATION Take 1 capsule by mouth daily. BioAstin [place] Astaxanthin     [provider]  Salicylic Acid 3 % OINT Apply 1 application topically daily as needed (for remove dry, dead skin).     [provider]  testosterone cypionate (DEPOTESTOSTERONE CYPIONATE) 200 MG/ML injection Inject 60 mg into the muscle every Thursday.  11/02/17   [provider]  TURMERIC PO Take 15 mLs by mouth daily. 15 ml daily     [provider]    Family History Family History  Problem Relation Age of Onset   Arthritis Mother    Depression Mother    Alzheimer's disease Mother    Alcohol abuse Father    Arthritis Father    COPD Father    Hearing loss Father    Heart disease Father    Colon cancer Sister 39   Colon cancer Paternal Aunt    Colon cancer Cousin    Colon polyps Neg Hx    Esophageal cancer Neg Hx    Rectal cancer Neg Hx    Stomach cancer Neg Hx     Social History Social History   Tobacco Use   Smoking status: Former    Packs/day: 1.50    Years: 3.00    Pack years: 4.50    Types: Cigarettes    Quit date: 12/11/1976    Years since quitting: 44.8   Smokeless tobacco: Never  Vaping Use   Vaping Use: Never used  Substance Use Topics   Alcohol use: No   Drug use: No     Allergies   Levofloxacin   Review of Systems Review of Systems See HPI  Physical Exam Triage Vital Signs ED Triage Vitals  Enc Vitals Group     BP      Pulse      Resp      Temp      Temp src      SpO2      Weight      Height      Head Circumference      Peak Flow      Pain Score      Pain Loc      Pain  Edu?      Excl. in [place]?    No data found.  Updated Vital Signs There were no vitals taken for this visit.     Physical Exam Constitutional:  General: He is not in acute distress.    Appearance: He is well-developed.  HENT:     Head: Normocephalic and atraumatic.     Mouth/Throat:     Comments: Mask is in place Eyes:     Conjunctiva/sclera: Conjunctivae normal.     Pupils: Pupils are equal, round, and reactive to light.  Cardiovascular:     Rate and Rhythm: Normal rate.  Pulmonary:     Effort: Pulmonary effort is normal. No respiratory distress.  Abdominal:     General: There is no distension.     Palpations: Abdomen is soft.  Musculoskeletal:        General: Normal range of motion.     Cervical back: Normal range of motion.     Comments: Left elbow has full range of motion.  There is swelling over the olecranon bursa.  No redness.  Mild warmth.  No tenderness to palpation.  Skin:    General: Skin is warm and dry.  Neurological:     Mental Status: He is alert.     UC Treatments / Results  Labs (all labs ordered are listed, but only abnormal results are displayed) Labs Reviewed - No data to display  EKG   Radiology No results found.  Procedures Procedures (including critical care time)  Medications Ordered in UC Medications - No data to display  Initial Impression / Assessment and Plan / UC Course  I have reviewed the triage vital signs and the nursing notes.  Pertinent labs & imaging results that were available during my care of the patient were reviewed by me and considered in my medical decision making (see chart for details).     Discussed bursitis.  Reasons for not aspirating, would likely reaccumulate, and could increase risk of infection.  We will treat with anti-inflammatories.  Return as needed Final Clinical Impressions(s) / UC Diagnoses   Final diagnoses:  Olecranon bursitis of left elbow     Discharge Instructions      Ice or heat  to area Take ibuprofen 600 mg 3 times a day with food Consider getting a compression sleeve Follow-up as needed     ED Prescriptions   None    PDMP not reviewed this encounter.   Raylene Everts, MD 09/18/21 1331

## 2021-09-19 NOTE — ED Provider Notes (Signed)
[name]    CSN: [id number] Arrival date & time:         History   Chief Complaint No chief complaint on file.   HPI Jordan Berg is a 65 y.o. male.   HPI  Past Medical History:  Diagnosis Date   Arthritis    mild   Cataract    forming   Colon polyps 01/20/2009; 11/24/18; 04/2019   2010: NON-adenomatous-recall 10 yrs.  11/2018->adenomatous. repeat 05/03/19 mo b/c of piecemeal resection of one of the polyps->small area at 11/2018 polypectomy site bx'd->benign.  +Hx of postpolypectomy bleeding-->colonoscopy was done 3d after 11/2018 polypectomy to fix this.   GERD (gastroesophageal reflux disease)    mild   Helicobacter pylori gastritis 02/2018   EGD+.  Stool antigen NEG after abx treatment.   Hepatic steatosis    Noted on MRI abd 12/2017 (done for RUQ pain). also noted on EUS.   Hypercholesterolemia 12/2017   Recommended statin 12/2017: pt chose TLC and repeat chol 3 mo. Recommended statin 01/2020.   Kidney stone    Obesity, Class II, BMI 35-39.9    Primary hypogonadism in male    Managed by pt's urologist (including biannual DREs, Hbs and PSAs).   RUQ abdominal pain Dec 2018;Jan 2019   Labs nl, abd plain films nl, MRCP nl.  GI did EUS 2019, normal except chronic gastritis, + H pylori.  Stool antigen test NEG after abx tx.    Patient Active Problem List   Diagnosis Date Noted   Acute lower GI bleeding 11/24/2018   Personal history of colonic polyps 11/24/2018   GI bleeding 11/24/2018   Gastritis and gastroduodenitis    Hypercholesterolemia 12/11/2017   CHOLELITHIASIS 12/28/2008   RUQ pain 12/25/2008   ABDOMINAL PAIN 12/24/2008    Past Surgical History:  Procedure Laterality Date   CHOLECYSTECTOMY  2010   COLONOSCOPY     COLONOSCOPY N/A 11/25/2018   Adenomatous: recall 6 mo due to piecemeal resection of one of the polyps.  Procedure: COLONOSCOPY;  Surgeon: Rush Landmark Telford Nab., MD;  Location: [facility];  Service: Gastroenterology;   Laterality: N/A;   COLONOSCOPY  11/28/2018   for post polypectomy bleeding.  Vessel cauterized/clipped   COLONOSCOPY  05/03/2019   f/u view of polypectomy site from 11/2018 was done due to piecemeal resection. Bx ->inflam/benign. Recall 3 yrs.   COLONOSCOPY W/ POLYPECTOMY  01/20/2009   Multiple nonadenomatous polyps.  Recall 10 yrs.   ESOPHAGOGASTRODUODENOSCOPY  02/14/2018   +EUS: Chronic active gastritis, + H PYLORI, tx'd with abx by GI, stool antigen test NEG after abx (04/2018)   EUS N/A 02/14/2018   Procedure: UPPER ENDOSCOPIC ULTRASOUND (EUS) LINEAR;  Surgeon: Milus Banister, MD;  Location: [facility];  Service: Endoscopy;  Laterality: N/A;   HOT HEMOSTASIS N/A 11/25/2018   Procedure: HOT HEMOSTASIS (ARGON PLASMA COAGULATION/BICAP);  Surgeon: Irving Copas., MD;  Location: [facility];  Service: Gastroenterology;  Laterality: N/A;   KNEE SURGERY Left 2000 and 2017   arthroscopic   LUMBAR SPINE SURGERY  2000   No hardware   NASAL SEPTUM SURGERY  1981   POLYPECTOMY     SUBMUCOSAL INJECTION  11/25/2018   Procedure: SUBMUCOSAL INJECTION;  Surgeon: Irving Copas., MD;  Location: [facility];  Service: Gastroenterology;;   [facility]         Home Medications    Prior to Admission medications   Medication Sig Start Date End Date Taking? Authorizing Provider  Alcaftadine 0.25 % SOLN Apply 1 drop to eye daily.     [provider]  B-D 3CC LUER-LOK SYR 22GX1-1/2 22G X 1-1/2" 3 ML MISC USE AS DIRECTED FOR TESTOSTERONE INJECTIONS 01/06/20   [provider]  carboxymethylcellulose (REFRESH PLUS) 0.5 % SOLN Place 1 drop into both eyes 3 (three) times daily as needed (dry eyes).    [provider]  clobetasol cream (TEMOVATE) 1.93 % Apply 1 application topically daily as needed (dry skin).    [provider]  Coenzyme Q10 (CO Q-10 PO) Take 10 mLs by mouth daily.    [provider]   Misc Natural Products (PROSTATE HEALTH PO) Take 1 capsule by mouth daily.    [provider]  Omega-3 Fatty Acids (FISH OIL) 1000 MG CAPS Take 1 capsule by mouth daily.     [provider]  OVER THE COUNTER MEDICATION Take 1 capsule by mouth daily. BioAstin [place] Astaxanthin     [provider]  Salicylic Acid 3 % OINT Apply 1 application topically daily as needed (for remove dry, dead skin).     [provider]  testosterone cypionate (DEPOTESTOSTERONE CYPIONATE) 200 MG/ML injection Inject 60 mg into the muscle every Thursday.  11/02/17   [provider]  TURMERIC PO Take 15 mLs by mouth daily. 15 ml daily     [provider]    Family History Family History  Problem Relation Age of Onset   Arthritis Mother    Depression Mother    Alzheimer's disease Mother    Alcohol abuse Father    Arthritis Father    COPD Father    Hearing loss Father    Heart disease Father    Colon cancer Sister 1   Colon cancer Paternal Aunt    Colon cancer Cousin    Colon polyps Neg Hx    Esophageal cancer Neg Hx    Rectal cancer Neg Hx    Stomach cancer Neg Hx     Social History Social History   Tobacco Use   Smoking status: Former    Packs/day: 1.50    Years: 3.00    Pack years: 4.50    Types: Cigarettes    Quit date: 12/11/1976    Years since quitting: 44.8   Smokeless tobacco: Never  Vaping Use   Vaping Use: Never used  Substance Use Topics   Alcohol use: No   Drug use: No     Allergies   Levofloxacin   Review of Systems Review of Systems   Physical Exam Triage Vital Signs ED Triage Vitals  Enc Vitals Group     BP      Pulse      Resp      Temp      Temp src      SpO2      Weight      Height      Head Circumference      Peak Flow      Pain Score      Pain Loc      Pain Edu?      Excl. in [place]?    No data found.  Updated Vital Signs There were no vitals taken for this visit.      Physical  Exam Constitutional:      General: He is not in acute distress.    Appearance: He is well-developed.  HENT:     Head: Normocephalic and atraumatic.  Nose:     Comments: Mask is in place Eyes:     Conjunctiva/sclera: Conjunctivae normal.     Pupils: Pupils are equal, round, and reactive to light.  Cardiovascular:     Rate and Rhythm: Normal rate.  Pulmonary:     Effort: Pulmonary effort is normal. No respiratory distress.  Abdominal:     General: There is no distension.     Palpations: Abdomen is soft.  Musculoskeletal:        General: Normal range of motion.     Cervical back: Normal range of motion.     Comments: Left elbow has a small to moderate effusion over the olecranon bursa.  No warmth.  No tenderness.  No redness.  No open wound  Skin:    General: Skin is warm and dry.  Neurological:     Mental Status: He is alert.     UC Treatments / Results  Labs (all labs ordered are listed, but only abnormal results are displayed) Labs Reviewed - No data to display  EKG   Radiology No results found.  Procedures Procedures (including critical care time)  Medications Ordered in UC Medications - No data to display  Initial Impression / Assessment and Plan / UC Course  I have reviewed the triage vital signs and the nursing notes.  Pertinent labs & imaging results that were available during my care of the patient were reviewed by me and considered in my medical decision making (see chart for details).     Benign nature of bursitis is discussed with patient.  Reasons to not to drain the small fluid collection given (recollection, risk of infection).  Should get better with conservative management Final Clinical Impressions(s) / UC Diagnoses   Final diagnoses:  Olecranon bursitis of left elbow     Discharge Instructions      Ice or heat to area Take ibuprofen 600 mg 3 times a day with food Consider getting a compression sleeve Follow-up as needed   ED  Prescriptions   None    PDMP not reviewed this encounter.   Raylene Everts, MD 09/19/21 [phone]

## 2021-10-26 DIAGNOSIS — R948 Abnormal results of function studies of other organs and systems: Secondary | ICD-10-CM | POA: Diagnosis not present

## 2021-10-28 ENCOUNTER — Ambulatory Visit: Payer: Medicare Other | Admitting: Family Medicine

## 2021-10-28 ENCOUNTER — Ambulatory Visit: Payer: Self-pay

## 2021-10-28 ENCOUNTER — Encounter: Payer: Self-pay | Admitting: Family Medicine

## 2021-10-28 VITALS — BP 118/80 | Ht 71.0 in

## 2021-10-28 DIAGNOSIS — M7022 Olecranon bursitis, left elbow: Secondary | ICD-10-CM

## 2021-10-28 MED ORDER — PREDNISONE 5 MG PO TABS: ORAL_TABLET | ORAL | 0 refills | Status: DC

## 2021-10-28 NOTE — Progress Notes (Signed)
Jordan Berg - 65 y.o. male MRN [id number]  Date of birth: 1956/04/21  SUBJECTIVE:  Including CC & ROS.  No chief complaint on file.   Jordan Berg is a 65 y.o. male that is presenting with left elbow pain.  He has had a bursitis for about a month now.  Denies any trauma.  No history of gout.   Review of Systems See HPI   HISTORY: Past Medical, Surgical, Social, and Family History Reviewed & Updated per EMR.   Pertinent Historical Findings include:  Past Medical History:  Diagnosis Date   Arthritis    mild   Cataract    forming   Colon polyps 01/20/2009; 11/24/18; 04/2019   2010: NON-adenomatous-recall 10 yrs.  11/2018->adenomatous. repeat 05/03/19 mo b/c of piecemeal resection of one of the polyps->small area at 11/2018 polypectomy site bx'd->benign.  +Hx of postpolypectomy bleeding-->colonoscopy was done 3d after 11/2018 polypectomy to fix this.   GERD (gastroesophageal reflux disease)    mild   Helicobacter pylori gastritis 02/2018   EGD+.  Stool antigen NEG after abx treatment.   Hepatic steatosis    Noted on MRI abd 12/2017 (done for RUQ pain). also noted on EUS.   Hypercholesterolemia 12/2017   Recommended statin 12/2017: pt chose TLC and repeat chol 3 mo. Recommended statin 01/2020.   Kidney stone    Obesity, Class II, BMI 35-39.9    Primary hypogonadism in male    Managed by pt's urologist (including biannual DREs, Hbs and PSAs).   RUQ abdominal pain Dec 2018;Jan 2019   Labs nl, abd plain films nl, MRCP nl.  GI did EUS 2019, normal except chronic gastritis, + H pylori.  Stool antigen test NEG after abx tx.    Past Surgical History:  Procedure Laterality Date   CHOLECYSTECTOMY  2010   COLONOSCOPY     COLONOSCOPY N/A 11/25/2018   Adenomatous: recall 6 mo due to piecemeal resection of one of the polyps.  Procedure: COLONOSCOPY;  Surgeon: Rush Landmark Telford Nab., MD;  Location: [facility];  Service: Gastroenterology;  Laterality: N/A;   COLONOSCOPY  11/28/2018    for post polypectomy bleeding.  Vessel cauterized/clipped   COLONOSCOPY  05/03/2019   f/u view of polypectomy site from 11/2018 was done due to piecemeal resection. Bx ->inflam/benign. Recall 3 yrs.   COLONOSCOPY W/ POLYPECTOMY  01/20/2009   Multiple nonadenomatous polyps.  Recall 10 yrs.   ESOPHAGOGASTRODUODENOSCOPY  02/14/2018   +EUS: Chronic active gastritis, + H PYLORI, tx'd with abx by GI, stool antigen test NEG after abx (04/2018)   EUS N/A 02/14/2018   Procedure: UPPER ENDOSCOPIC ULTRASOUND (EUS) LINEAR;  Surgeon: Milus Banister, MD;  Location: [facility];  Service: Endoscopy;  Laterality: N/A;   HOT HEMOSTASIS N/A 11/25/2018   Procedure: HOT HEMOSTASIS (ARGON PLASMA COAGULATION/BICAP);  Surgeon: Irving Copas., MD;  Location: [facility];  Service: Gastroenterology;  Laterality: N/A;   KNEE SURGERY Left 2000 and 2017   arthroscopic   LUMBAR SPINE SURGERY  2000   No hardware   NASAL SEPTUM SURGERY  1981   POLYPECTOMY     SUBMUCOSAL INJECTION  11/25/2018   Procedure: SUBMUCOSAL INJECTION;  Surgeon: Irving Copas., MD;  Location: [facility];  Service: Gastroenterology;;   TONSILLECTOMY  1978   UPPER GASTROINTESTINAL ENDOSCOPY      Family History  Problem Relation Age of Onset   Arthritis Mother    Depression Mother    Alzheimer's disease Mother    Alcohol abuse Father  Arthritis Father    COPD Father    Hearing loss Father    Heart disease Father    Colon cancer Sister 46   Colon cancer Paternal Aunt    Colon cancer Cousin    Colon polyps Neg Hx    Esophageal cancer Neg Hx    Rectal cancer Neg Hx    Stomach cancer Neg Hx     Social History   Socioeconomic History   Marital status: Married    Spouse name: Not on file   Number of children: Not on file   Years of education: Not on file   Highest education level: Not on file  Occupational History   Not on file  Tobacco Use   Smoking status: Former    Packs/day: 1.50    Years: 3.00     Pack years: 4.50    Types: Cigarettes    Quit date: 12/11/1976    Years since quitting: 44.9   Smokeless tobacco: Never  Vaping Use   Vaping Use: Never used  Substance and Sexual Activity   Alcohol use: No   Drug use: No   Sexual activity: Not on file  Other Topics Concern   Not on file  Social History Narrative   Not on file   Social Determinants of Health   Financial Resource Strain: Not on file  Food Insecurity: Not on file  Transportation Needs: Not on file  Physical Activity: Not on file  Stress: Not on file  Social Connections: Not on file  Intimate Partner Violence: Not on file     PHYSICAL EXAM:  VS: BP 118/80 (BP Location: Left Arm, Patient Position: Sitting)   Ht 5\' 11"  (1.803 m)   BMI 35.54 kg/m  Physical Exam Gen: NAD, alert, cooperative with exam, well-appearing   Limited ultrasound: Left elbow:  Bursitis appreciated with hyperemia associated. No heterogenous material within the bursa Normal insertion of the triceps tendon  Summary: Olecranon bursitis  Ultrasound and interpretation by Clearance Coots, MD    ASSESSMENT & PLAN:   Olecranon bursitis of left elbow Does appear to have an inflammatory component to it.  No history of gout. -Counseled on home exercise therapy and supportive care. -Prednisone. -Could consider aspiration.

## 2021-10-28 NOTE — Assessment & Plan Note (Signed)
Does appear to have an inflammatory component to it.  No history of gout. -Counseled on home exercise therapy and supportive care. -Prednisone. -Could consider aspiration.

## 2021-10-28 NOTE — Patient Instructions (Signed)
Nice to meet you Please try to continue compression  Please try the medicine   Please send me a message in MyChart with any questions or updates.  Please see me back in 2 weeks.   --Dr. Raeford Razor

## 2021-10-31 LAB — TESTOSTERONE: Testosterone: 606.5

## 2021-11-09 ENCOUNTER — Emergency Department
Admission: EM | Admit: 2021-11-09 | Discharge: 2021-11-09 | Disposition: A | Discharge status: Home / Self Care | Payer: Medicare Other | Source: Home / Self Care | Attending: Family Medicine | Admitting: Family Medicine

## 2021-11-09 ENCOUNTER — Encounter: Payer: Self-pay | Admitting: Emergency Medicine

## 2021-11-09 DIAGNOSIS — R079 Chest pain, unspecified: Secondary | ICD-10-CM | POA: Diagnosis not present

## 2021-11-09 NOTE — ED Provider Notes (Addendum)
[name]    CSN: [id number] Arrival date & time: 11/09/21  1126      History   Chief Complaint Chief Complaint  Patient presents with   Chest Pain    HPI Jordan Berg is a 65 y.o. male.   HPI  Patient is here for evaluation of chest pain.  He states that he has been having chest pain since yesterday.  Started on his left arm.  Radiated to his left pectoralis region.  Now with substernal.  He states that shoots through to his back.  No shortness of breath   He states he did feel briefly dizzy when he woke up this morning but has chronic "inner ear vertigo".   He has no history of heart disease.  No hypertension.  No diabetes.  He has "borderline" high cholesterol.  Has never had chest pain with exertion.  At times he feels like his heart is "pounding".  Not beating fast, but beating harder than usual. He has not been for regular checkup because of COVID in the last year, but agrees to go see his PCP in follow-up Past Medical History:  Diagnosis Date   Arthritis    mild   Cataract    forming   Colon polyps 01/20/2009; 11/24/18; 04/2019   2010: NON-adenomatous-recall 10 yrs.  11/2018->adenomatous. repeat 05/03/19 mo b/c of piecemeal resection of one of the polyps->small area at 11/2018 polypectomy site bx'd->benign.  +Hx of postpolypectomy bleeding-->colonoscopy was done 3d after 11/2018 polypectomy to fix this.   GERD (gastroesophageal reflux disease)    mild   Helicobacter pylori gastritis 02/2018   EGD+.  Stool antigen NEG after abx treatment.   Hepatic steatosis    Noted on MRI abd 12/2017 (done for RUQ pain). also noted on EUS.   Hypercholesterolemia 12/2017   Recommended statin 12/2017: pt chose TLC and repeat chol 3 mo. Recommended statin 01/2020.   Kidney stone    Obesity, Class II, BMI 35-39.9    Primary hypogonadism in male    Managed by pt's urologist (including biannual DREs, Hbs and PSAs).   RUQ abdominal pain Dec 2018;Jan 2019   Labs nl, abd  plain films nl, MRCP nl.  GI did EUS 2019, normal except chronic gastritis, + H pylori.  Stool antigen test NEG after abx tx.    Patient Active Problem List   Diagnosis Date Noted   Olecranon bursitis of left elbow 10/28/2021   Acute lower GI bleeding 11/24/2018   Personal history of colonic polyps 11/24/2018   GI bleeding 11/24/2018   Gastritis and gastroduodenitis    Hypercholesterolemia 12/11/2017   CHOLELITHIASIS 12/28/2008   RUQ pain 12/25/2008   ABDOMINAL PAIN 12/24/2008    Past Surgical History:  Procedure Laterality Date   CHOLECYSTECTOMY  2010   COLONOSCOPY     COLONOSCOPY N/A 11/25/2018   Adenomatous: recall 6 mo due to piecemeal resection of one of the polyps.  Procedure: COLONOSCOPY;  Surgeon: Rush Landmark Telford Nab., MD;  Location: [facility];  Service: Gastroenterology;  Laterality: N/A;   COLONOSCOPY  11/28/2018   for post polypectomy bleeding.  Vessel cauterized/clipped   COLONOSCOPY  05/03/2019   f/u view of polypectomy site from 11/2018 was done due to piecemeal resection. Bx ->inflam/benign. Recall 3 yrs.   COLONOSCOPY W/ POLYPECTOMY  01/20/2009   Multiple nonadenomatous polyps.  Recall 10 yrs.   ESOPHAGOGASTRODUODENOSCOPY  02/14/2018   +EUS: Chronic active gastritis, + H PYLORI, tx'd with abx by GI, stool antigen test NEG after  abx (04/2018)   EUS N/A 02/14/2018   Procedure: UPPER ENDOSCOPIC ULTRASOUND (EUS) LINEAR;  Surgeon: Milus Banister, MD;  Location: [facility];  Service: Endoscopy;  Laterality: N/A;   HOT HEMOSTASIS N/A 11/25/2018   Procedure: HOT HEMOSTASIS (ARGON PLASMA COAGULATION/BICAP);  Surgeon: Irving Copas., MD;  Location: [facility];  Service: Gastroenterology;  Laterality: N/A;   KNEE SURGERY Left 2000 and 2017   arthroscopic   LUMBAR SPINE SURGERY  2000   No hardware   NASAL SEPTUM SURGERY  1981   POLYPECTOMY     SUBMUCOSAL INJECTION  11/25/2018   Procedure: SUBMUCOSAL INJECTION;  Surgeon: Irving Copas., MD;   Location: [facility];  Service: Gastroenterology;;   [facility]         Home Medications    Prior to Admission medications   Medication Sig Start Date End Date Taking? Authorizing Provider  B-D 3CC LUER-LOK SYR 22GX1-1/2 22G X 1-1/2" 3 ML MISC USE AS DIRECTED FOR TESTOSTERONE INJECTIONS 01/06/20   [provider]  carboxymethylcellulose (REFRESH PLUS) 0.5 % SOLN Place 1 drop into both eyes 3 (three) times daily as needed (dry eyes).    [provider]  clobetasol cream (TEMOVATE) 7.49 % Apply 1 application topically daily as needed (dry skin).    [provider]  Misc Natural Products (PROSTATE HEALTH PO) Take 1 capsule by mouth daily.    [provider]  Omega-3 Fatty Acids (FISH OIL) 1000 MG CAPS Take 1 capsule by mouth daily.     [provider]  OVER THE COUNTER MEDICATION Take 1 capsule by mouth daily. BioAstin [place] Astaxanthin     [provider]  Salicylic Acid 3 % OINT Apply 1 application topically daily as needed (for remove dry, dead skin).     [provider]  testosterone cypionate (DEPOTESTOSTERONE CYPIONATE) 200 MG/ML injection Inject 60 mg into the muscle every Thursday.  11/02/17   [provider]    Family History Family History  Problem Relation Age of Onset   Arthritis Mother    Depression Mother    Alzheimer's disease Mother    Alcohol abuse Father    Arthritis Father    COPD Father    Hearing loss Father    Heart disease Father    Colon cancer Sister 63   Colon cancer Paternal Aunt    Colon cancer Cousin    Colon polyps Neg Hx    Esophageal cancer Neg Hx    Rectal cancer Neg Hx    Stomach cancer Neg Hx     Social History Social History   Tobacco Use   Smoking status: Former    Packs/day: 1.50    Years: 3.00    Pack years: 4.50    Types: Cigarettes    Quit date: 12/11/1976    Years since quitting: 44.9   Smokeless tobacco:  Never  Vaping Use   Vaping Use: Never used  Substance Use Topics   Alcohol use: No   Drug use: No     Allergies   Levofloxacin   Review of Systems Review of Systems See HPI  Physical Exam Triage Vital Signs ED Triage Vitals  Enc Vitals Group     BP 11/09/21 1146 120/80     Pulse Rate 11/09/21 1146 88     Resp 11/09/21 1146 16     Temp 11/09/21 1146 98.6 F (37 C)     Temp Source 11/09/21 1146 Oral  SpO2 11/09/21 1146 96 %     Weight 11/09/21 1155 246 lb (111.6 kg)     Height --      Head Circumference --      Peak Flow --      Pain Score 11/09/21 1156 1     Pain Loc --      Pain Edu? --      Excl. in [place]? --    No data found.  Updated Vital Signs BP 120/80 (BP Location: Left Arm)   Pulse 88   Temp 98.6 F (37 C) (Oral)   Resp 16   Wt 111.6 kg   SpO2 96%   BMI 34.31 kg/m     Physical Exam Constitutional:      General: He is not in acute distress.    Appearance: He is well-developed. He is not ill-appearing.  HENT:     Head: Normocephalic and atraumatic.  Eyes:     Conjunctiva/sclera: Conjunctivae normal.     Pupils: Pupils are equal, round, and reactive to light.  Cardiovascular:     Rate and Rhythm: Normal rate and regular rhythm. No extrasystoles are present.    Chest Wall: PMI is not displaced.     Heart sounds: Normal heart sounds. No murmur heard. Pulmonary:     Effort: Pulmonary effort is normal. No respiratory distress.     Breath sounds: Normal breath sounds.  Abdominal:     General: Bowel sounds are normal. There is no distension or abdominal bruit.     Palpations: Abdomen is soft. There is no mass.     Tenderness: There is no abdominal tenderness.  Musculoskeletal:        General: Normal range of motion.     Cervical back: Normal range of motion.     Right lower leg: No edema.     Left lower leg: No edema.  Skin:    General: Skin is warm and dry.  Neurological:     General: No focal deficit present.     Mental Status: He is  alert.     UC Treatments / Results  Labs (all labs ordered are listed, but only abnormal results are displayed) Labs Reviewed - No data to display  EKG Rate 84.  No ST or T wave changes.  Unchanged from prior  Radiology No results found.  Procedures Procedures (including critical care time)  Medications Ordered in UC Medications - No data to display  Initial Impression / Assessment and Plan / UC Course  I have reviewed the triage vital signs and the nursing notes.  Pertinent labs & imaging results that were available during my care of the patient were reviewed by me and considered in my medical decision making (see chart for details).     EKG is normal.  Unchanged from prior.  Explained to the patient that he can have heart disease with a normal EKG and exam.  He needs to follow-up with his primary care doctor for additional testing.  If this substernal chest pain comes back, he really should go to emergency room.  Patient voices understanding Final Clinical Impressions(s) / UC Diagnoses   Final diagnoses:  Chest pain, unspecified type     Discharge Instructions      Call your primary care doctor for a routine check up You need updated lab tests Lat me know if you have trouble sooner   ED Prescriptions   None    PDMP not reviewed this encounter.   Meda Coffee,  Jennette Banker, MD 11/09/21 1327    Raylene Everts, MD 11/09/21 2010

## 2021-11-09 NOTE — Discharge Instructions (Signed)
Call your primary care doctor for a routine check up You need updated lab tests Lat me know if you have trouble sooner

## 2021-11-09 NOTE — ED Triage Notes (Signed)
Sternal chest pain started yesterday  Pain was on left side - radiates around to chest  C/o feeling dizzy in bed this am  Pt here w/ wife

## 2021-11-10 ENCOUNTER — Ambulatory Visit (HOSPITAL_BASED_OUTPATIENT_CLINIC_OR_DEPARTMENT_OTHER)
Admission: RE | Admit: 2021-11-10 | Discharge: 2021-11-10 | Disposition: A | Discharge status: Home / Self Care | Payer: Medicare Other | Source: Ambulatory Visit | Attending: Family Medicine | Admitting: Family Medicine

## 2021-11-10 ENCOUNTER — Other Ambulatory Visit: Payer: Self-pay

## 2021-11-10 ENCOUNTER — Ambulatory Visit: Payer: Medicare Other | Admitting: Family Medicine

## 2021-11-10 ENCOUNTER — Encounter: Payer: Self-pay | Admitting: Family Medicine

## 2021-11-10 VITALS — BP 140/80 | Ht 71.0 in | Wt 246.0 lb

## 2021-11-10 DIAGNOSIS — R0789 Other chest pain: Secondary | ICD-10-CM | POA: Diagnosis not present

## 2021-11-10 DIAGNOSIS — M7022 Olecranon bursitis, left elbow: Secondary | ICD-10-CM | POA: Diagnosis not present

## 2021-11-10 DIAGNOSIS — R059 Cough, unspecified: Secondary | ICD-10-CM | POA: Diagnosis not present

## 2021-11-10 LAB — D-DIMER, QUANTITATIVE: D-DIMER: 0.37 mg/L FEU (ref 0.00–0.49)

## 2021-11-10 LAB — SEDIMENTATION RATE: Sed Rate: 2 mm/hr (ref 0–30)

## 2021-11-10 LAB — TROPONIN T: Troponin T (Highly Sensitive): 13 ng/L (ref 0–22)

## 2021-11-10 NOTE — Patient Instructions (Signed)
Good to see you Continue the compression on the elbow  I'll call with the results from today   Please send me a message in MyChart with any questions or updates.  Please see me back in 4 weeks.   --Dr. Raeford Razor

## 2021-11-10 NOTE — Assessment & Plan Note (Signed)
Continues to get improvement.  No redness on exam. -Counseled on home exercise therapy and supportive care. -Uric acid.

## 2021-11-10 NOTE — Progress Notes (Signed)
Jordan Berg - 65 y.o. male MRN [id number]  Date of birth: [birth year]  SUBJECTIVE:  Including CC & ROS.  No chief complaint on file.   Jordan Berg is a 65 y.o. male that is following up for his left elbow bursitis and presenting with new onset chest pain.  He was evaluated in the urgent care and had unrevealing vital signs and work-up.  He continues to have a chest pain/pressure that is worse with bending forward.  He did have some elevated heart rate while walking around his house.   Review of Systems See HPI   HISTORY: Past Medical, Surgical, Social, and Family History Reviewed & Updated per EMR.   Pertinent Historical Findings include:  Past Medical History:  Diagnosis Date   Arthritis    mild   Cataract    forming   Colon polyps 01/20/2009; 11/24/18; 04/2019   2010: NON-adenomatous-recall 10 yrs.  11/2018->adenomatous. repeat 05/03/19 mo b/c of piecemeal resection of one of the polyps->small area at 11/2018 polypectomy site bx'd->benign.  +Hx of postpolypectomy bleeding-->colonoscopy was done 3d after 11/2018 polypectomy to fix this.   GERD (gastroesophageal reflux disease)    mild   Helicobacter pylori gastritis 02/2018   EGD+.  Stool antigen NEG after abx treatment.   Hepatic steatosis    Noted on MRI abd 12/2017 (done for RUQ pain). also noted on EUS.   Hypercholesterolemia 12/2017   Recommended statin 12/2017: pt chose TLC and repeat chol 3 mo. Recommended statin 01/2020.   Kidney stone    Obesity, Class II, BMI 35-39.9    Primary hypogonadism in male    Managed by pt's urologist (including biannual DREs, Hbs and PSAs).   RUQ abdominal pain Dec 2018;Jan 2019   Labs nl, abd plain films nl, MRCP nl.  GI did EUS 2019, normal except chronic gastritis, + H pylori.  Stool antigen test NEG after abx tx.    Past Surgical History:  Procedure Laterality Date   CHOLECYSTECTOMY  2010   COLONOSCOPY     COLONOSCOPY N/A 11/25/2018   Adenomatous: recall 6 mo due to  piecemeal resection of one of the polyps.  Procedure: COLONOSCOPY;  Surgeon: Rush Landmark Telford Nab., MD;  Location: [facility];  Service: Gastroenterology;  Laterality: N/A;   COLONOSCOPY  11/28/2018   for post polypectomy bleeding.  Vessel cauterized/clipped   COLONOSCOPY  05/03/2019   f/u view of polypectomy site from 11/2018 was done due to piecemeal resection. Bx ->inflam/benign. Recall 3 yrs.   COLONOSCOPY W/ POLYPECTOMY  01/20/2009   Multiple nonadenomatous polyps.  Recall 10 yrs.   ESOPHAGOGASTRODUODENOSCOPY  02/14/2018   +EUS: Chronic active gastritis, + H PYLORI, tx'd with abx by GI, stool antigen test NEG after abx (04/2018)   EUS N/A 02/14/2018   Procedure: UPPER ENDOSCOPIC ULTRASOUND (EUS) LINEAR;  Surgeon: Milus Banister, MD;  Location: [facility];  Service: Endoscopy;  Laterality: N/A;   HOT HEMOSTASIS N/A 11/25/2018   Procedure: HOT HEMOSTASIS (ARGON PLASMA COAGULATION/BICAP);  Surgeon: Irving Copas., MD;  Location: [facility];  Service: Gastroenterology;  Laterality: N/A;   KNEE SURGERY Left 2000 and 2017   arthroscopic   LUMBAR SPINE SURGERY  2000   No hardware   NASAL SEPTUM SURGERY  1981   POLYPECTOMY     SUBMUCOSAL INJECTION  11/25/2018   Procedure: SUBMUCOSAL INJECTION;  Surgeon: Irving Copas., MD;  Location: [facility];  Service: Gastroenterology;;   TONSILLECTOMY  1978   UPPER GASTROINTESTINAL ENDOSCOPY  Family History  Problem Relation Age of Onset   Arthritis Mother    Depression Mother    Alzheimer's disease Mother    Alcohol abuse Father    Arthritis Father    COPD Father    Hearing loss Father    Heart disease Father    Colon cancer Sister 54   Colon cancer Paternal Aunt    Colon cancer Cousin    Colon polyps Neg Hx    Esophageal cancer Neg Hx    Rectal cancer Neg Hx    Stomach cancer Neg Hx     Social History   Socioeconomic History   Marital status: Married    Spouse name: Not on file   Number of children:  Not on file   Years of education: Not on file   Highest education level: Not on file  Occupational History   Not on file  Tobacco Use   Smoking status: Former    Packs/day: 1.50    Years: 3.00    Pack years: 4.50    Types: Cigarettes    Quit date: 12/11/1976    Years since quitting: 44.9   Smokeless tobacco: Never  Vaping Use   Vaping Use: Never used  Substance and Sexual Activity   Alcohol use: No   Drug use: No   Sexual activity: Not on file  Other Topics Concern   Not on file  Social History Narrative   Not on file   Social Determinants of Health   Financial Resource Strain: Not on file  Food Insecurity: Not on file  Transportation Needs: Not on file  Physical Activity: Not on file  Stress: Not on file  Social Connections: Not on file  Intimate Partner Violence: Not on file     PHYSICAL EXAM:  VS: BP 140/80 (BP Location: Right Arm, Patient Position: Sitting)   Ht 5\' 11"  (1.803 m)   Wt 246 lb (111.6 kg)   BMI 34.31 kg/m  Physical Exam Gen: NAD, alert, cooperative with exam, well-appearing    ASSESSMENT & PLAN:   Olecranon bursitis of left elbow Continues to get improvement.  No redness on exam. -Counseled on home exercise therapy and supportive care. -Uric acid.  Other chest pain Had a reassuring EKG and vital signs are reassuring.  Unclear if this is more of myocarditis.  Well score was low risk but Geneva score was moderate risk. -Counseled on home exercise therapy and supportive care. -Chest x-ray. -Troponin, CRP, sed rate, D-dimer, BMP and CBC

## 2021-11-10 NOTE — Assessment & Plan Note (Signed)
Had a reassuring EKG and vital signs are reassuring.  Unclear if this is more of myocarditis.  Well score was low risk but Geneva score was moderate risk. -Counseled on home exercise therapy and supportive care. -Chest x-ray. -Troponin, CRP, sed rate, D-dimer, BMP and CBC

## 2021-11-11 ENCOUNTER — Telehealth: Payer: Self-pay | Admitting: Family Medicine

## 2021-11-11 LAB — COMPREHENSIVE METABOLIC PANEL
ALT: 24 IU/L (ref 0–44)
AST: 23 IU/L (ref 0–40)
Albumin/Globulin Ratio: 2.1 (ref 1.2–2.2)
Albumin: 4.4 g/dL (ref 3.8–4.8)
Alkaline Phosphatase: 44 IU/L (ref 44–121)
BUN/Creatinine Ratio: 12 (ref 10–24)
BUN: 14 mg/dL (ref 8–27)
Bilirubin Total: 0.5 mg/dL (ref 0.0–1.2)
CO2: 24 mmol/L (ref 20–29)
Calcium: 9 mg/dL (ref 8.6–10.2)
Chloride: 103 mmol/L (ref 96–106)
Creatinine, Ser: 1.17 mg/dL (ref 0.76–1.27)
Globulin, Total: 2.1 g/dL (ref 1.5–4.5)
Glucose: 91 mg/dL (ref 70–99)
Potassium: 4.5 mmol/L (ref 3.5–5.2)
Sodium: 139 mmol/L (ref 134–144)
Total Protein: 6.5 g/dL (ref 6.0–8.5)
eGFR: 69 mL/min/{1.73_m2} (ref >59)

## 2021-11-11 LAB — CBC WITH DIFFERENTIAL
Basophils Absolute: 0.1 10*3/uL (ref 0.0–0.2)
Basos: 1 %
EOS (ABSOLUTE): 0.4 10*3/uL (ref 0.0–0.4)
Eos: 6 %
Hematocrit: 39.6 % (ref 37.5–51.0)
Hemoglobin: 12.1 g/dL — ABNORMAL LOW (ref 13.0–17.7)
Immature Grans (Abs): 0.1 10*3/uL (ref 0.0–0.1)
Immature Granulocytes: 1 %
Lymphocytes Absolute: 1.4 10*3/uL (ref 0.7–3.1)
Lymphs: 24 %
MCH: 24.5 pg — ABNORMAL LOW (ref 26.6–33.0)
MCHC: 30.6 g/dL — ABNORMAL LOW (ref 31.5–35.7)
MCV: 80 fL (ref 79–97)
Monocytes Absolute: 0.7 10*3/uL (ref 0.1–0.9)
Monocytes: 11 %
Neutrophils Absolute: 3.4 10*3/uL (ref 1.4–7.0)
Neutrophils: 57 %
RBC: 4.93 x10E6/uL (ref 4.14–5.80)
RDW: 15.4 % (ref 11.6–15.4)
WBC: 6 10*3/uL (ref 3.4–10.8)

## 2021-11-11 LAB — URIC ACID: Uric Acid: 7.2 mg/dL (ref 3.8–8.4)

## 2021-11-11 LAB — C-REACTIVE PROTEIN: CRP: 2 mg/L (ref 0–10)

## 2021-11-11 NOTE — Telephone Encounter (Signed)
Informed of results.   Rosemarie Ax, MD [facility] 11/11/2021, 12:42 PM

## 2021-12-08 ENCOUNTER — Ambulatory Visit: Payer: Medicare Other | Admitting: Family Medicine

## 2021-12-08 ENCOUNTER — Encounter: Payer: Self-pay | Admitting: Family Medicine

## 2021-12-08 DIAGNOSIS — M7022 Olecranon bursitis, left elbow: Secondary | ICD-10-CM | POA: Diagnosis not present

## 2021-12-08 NOTE — Progress Notes (Signed)
Jordan Berg - 65 y.o. male MRN [id number]  Date of birth: 13-Jan-1956  SUBJECTIVE:  Including CC & ROS.  No chief complaint on file.   Jordan Berg is a 65 y.o. male that is following up for his left elbow pain.   Review of Systems See HPI   HISTORY: Past Medical, Surgical, Social, and Family History Reviewed & Updated per EMR.   Pertinent Historical Findings include:  Past Medical History:  Diagnosis Date   Arthritis    mild   Cataract    forming   Colon polyps 01/20/2009; 11/24/18; 04/2019   2010: NON-adenomatous-recall 10 yrs.  11/2018->adenomatous. repeat 05/03/19 mo b/c of piecemeal resection of one of the polyps->small area at 11/2018 polypectomy site bx'd->benign.  +Hx of postpolypectomy bleeding-->colonoscopy was done 3d after 11/2018 polypectomy to fix this.   GERD (gastroesophageal reflux disease)    mild   Helicobacter pylori gastritis 02/2018   EGD+.  Stool antigen NEG after abx treatment.   Hepatic steatosis    Noted on MRI abd 12/2017 (done for RUQ pain). also noted on EUS.   Hypercholesterolemia 12/2017   Recommended statin 12/2017: pt chose TLC and repeat chol 3 mo. Recommended statin 01/2020.   Kidney stone    Obesity, Class II, BMI 35-39.9    Primary hypogonadism in male    Managed by pt's urologist (including biannual DREs, Hbs and PSAs).   RUQ abdominal pain Dec 2018;Jan 2019   Labs nl, abd plain films nl, MRCP nl.  GI did EUS 2019, normal except chronic gastritis, + H pylori.  Stool antigen test NEG after abx tx.    Past Surgical History:  Procedure Laterality Date   CHOLECYSTECTOMY  2010   COLONOSCOPY     COLONOSCOPY N/A 11/25/2018   Adenomatous: recall 6 mo due to piecemeal resection of one of the polyps.  Procedure: COLONOSCOPY;  Surgeon: Rush Landmark Telford Nab., MD;  Location: [facility];  Service: Gastroenterology;  Laterality: N/A;   COLONOSCOPY  11/28/2018   for post polypectomy bleeding.  Vessel cauterized/clipped   COLONOSCOPY   05/03/2019   f/u view of polypectomy site from 11/2018 was done due to piecemeal resection. Bx ->inflam/benign. Recall 3 yrs.   COLONOSCOPY W/ POLYPECTOMY  01/20/2009   Multiple nonadenomatous polyps.  Recall 10 yrs.   ESOPHAGOGASTRODUODENOSCOPY  02/14/2018   +EUS: Chronic active gastritis, + H PYLORI, tx'd with abx by GI, stool antigen test NEG after abx (04/2018)   EUS N/A 02/14/2018   Procedure: UPPER ENDOSCOPIC ULTRASOUND (EUS) LINEAR;  Surgeon: Milus Banister, MD;  Location: [facility];  Service: Endoscopy;  Laterality: N/A;   HOT HEMOSTASIS N/A 11/25/2018   Procedure: HOT HEMOSTASIS (ARGON PLASMA COAGULATION/BICAP);  Surgeon: Irving Copas., MD;  Location: [facility];  Service: Gastroenterology;  Laterality: N/A;   KNEE SURGERY Left 2000 and 2017   arthroscopic   LUMBAR SPINE SURGERY  2000   No hardware   NASAL SEPTUM SURGERY  1981   POLYPECTOMY     SUBMUCOSAL INJECTION  11/25/2018   Procedure: SUBMUCOSAL INJECTION;  Surgeon: Rush Landmark Telford Nab., MD;  Location: [facility];  Service: Gastroenterology;;   TONSILLECTOMY  1978   UPPER GASTROINTESTINAL ENDOSCOPY      Family History  Problem Relation Age of Onset   Arthritis Mother    Depression Mother    Alzheimer's disease Mother    Alcohol abuse Father    Arthritis Father    COPD Father    Hearing loss Father    Heart  disease Father    Colon cancer Sister 66   Colon cancer Paternal Aunt    Colon cancer Cousin    Colon polyps Neg Hx    Esophageal cancer Neg Hx    Rectal cancer Neg Hx    Stomach cancer Neg Hx     Social History   Socioeconomic History   Marital status: Married    Spouse name: Not on file   Number of children: Not on file   Years of education: Not on file   Highest education level: Not on file  Occupational History   Not on file  Tobacco Use   Smoking status: Former    Packs/day: 1.50    Years: 3.00    Pack years: 4.50    Types: Cigarettes    Quit date: 12/11/1976    Years  since quitting: 45.0   Smokeless tobacco: Never  Vaping Use   Vaping Use: Never used  Substance and Sexual Activity   Alcohol use: No   Drug use: No   Sexual activity: Not on file  Other Topics Concern   Not on file  Social History Narrative   Not on file   Social Determinants of Health   Financial Resource Strain: Not on file  Food Insecurity: Not on file  Transportation Needs: Not on file  Physical Activity: Not on file  Stress: Not on file  Social Connections: Not on file  Intimate Partner Violence: Not on file     PHYSICAL EXAM:  VS: BP 120/76 (BP Location: Left Arm, Patient Position: Sitting)    Ht 5\' 11"  (1.803 m)    Wt 246 lb (111.6 kg)    BMI 34.31 kg/m  Physical Exam Gen: NAD, alert, cooperative with exam, well-appearing    ASSESSMENT & PLAN:   Olecranon bursitis of left elbow Has gotten improvement at this point. -Counseled on home exercise therapy and supportive care. -Could consider physical therapy.

## 2021-12-08 NOTE — Assessment & Plan Note (Signed)
Has gotten improvement at this point. -Counseled on home exercise therapy and supportive care. -Could consider physical therapy.

## 2022-04-27 DIAGNOSIS — Z135 Encounter for screening for eye and ear disorders: Secondary | ICD-10-CM | POA: Diagnosis not present

## 2022-04-27 DIAGNOSIS — H25813 Combined forms of age-related cataract, bilateral: Secondary | ICD-10-CM | POA: Diagnosis not present

## 2022-04-27 DIAGNOSIS — H524 Presbyopia: Secondary | ICD-10-CM | POA: Diagnosis not present

## 2022-05-03 ENCOUNTER — Encounter: Payer: Self-pay | Admitting: Gastroenterology

## 2022-05-22 DIAGNOSIS — H18413 Arcus senilis, bilateral: Secondary | ICD-10-CM | POA: Diagnosis not present

## 2022-05-22 DIAGNOSIS — H02834 Dermatochalasis of left upper eyelid: Secondary | ICD-10-CM | POA: Diagnosis not present

## 2022-05-22 DIAGNOSIS — H2513 Age-related nuclear cataract, bilateral: Secondary | ICD-10-CM | POA: Diagnosis not present

## 2022-05-22 DIAGNOSIS — H25013 Cortical age-related cataract, bilateral: Secondary | ICD-10-CM | POA: Diagnosis not present

## 2022-05-22 DIAGNOSIS — H2511 Age-related nuclear cataract, right eye: Secondary | ICD-10-CM | POA: Diagnosis not present

## 2022-06-07 ENCOUNTER — Encounter: Payer: Self-pay | Admitting: Gastroenterology

## 2022-07-18 DIAGNOSIS — H2511 Age-related nuclear cataract, right eye: Secondary | ICD-10-CM | POA: Diagnosis not present

## 2022-07-18 DIAGNOSIS — H2512 Age-related nuclear cataract, left eye: Secondary | ICD-10-CM | POA: Diagnosis not present

## 2022-07-18 DIAGNOSIS — H269 Unspecified cataract: Secondary | ICD-10-CM | POA: Diagnosis not present

## 2022-07-25 DIAGNOSIS — H269 Unspecified cataract: Secondary | ICD-10-CM | POA: Diagnosis not present

## 2022-07-25 DIAGNOSIS — H2512 Age-related nuclear cataract, left eye: Secondary | ICD-10-CM | POA: Diagnosis not present

## 2022-07-26 ENCOUNTER — Telehealth: Payer: Self-pay | Admitting: *Deleted

## 2022-07-26 NOTE — Telephone Encounter (Signed)
Dr. Rush Landmark,  This pt is coming for a recall colonoscopy on 09-01-22; he is a Dr. Ardis Hughs patient.  While prepping chart for PV, noted he was seen last November for chest pain.  He saw his sports medicine MD for follow up per ER doctor's recommendations- no further follow up recommendations were made.  Pt has had no further ER visits; just wanted you to be aware and make sure ok to proceed as scheduled.  Thanks, [employer]

## 2022-07-27 ENCOUNTER — Other Ambulatory Visit (INDEPENDENT_AMBULATORY_CARE_PROVIDER_SITE_OTHER): Payer: Medicare Other

## 2022-07-27 ENCOUNTER — Other Ambulatory Visit: Payer: Self-pay

## 2022-07-27 DIAGNOSIS — R1011 Right upper quadrant pain: Secondary | ICD-10-CM

## 2022-07-27 LAB — CBC WITH DIFFERENTIAL/PLATELET
Basophils Absolute: 0.1 10*3/uL (ref 0.0–0.1)
Basophils Relative: 1.8 % (ref 0.0–3.0)
Eosinophils Absolute: 0.4 10*3/uL (ref 0.0–0.7)
Eosinophils Relative: 5.7 % — ABNORMAL HIGH (ref 0.0–5.0)
HCT: 40.8 % (ref 39.0–52.0)
Hemoglobin: 12.9 g/dL — ABNORMAL LOW (ref 13.0–17.0)
Lymphocytes Relative: 21.5 % (ref 12.0–46.0)
Lymphs Abs: 1.7 10*3/uL (ref 0.7–4.0)
MCHC: 31.6 g/dL (ref 30.0–36.0)
MCV: 74.3 fl — ABNORMAL LOW (ref 78.0–100.0)
Monocytes Absolute: 0.8 10*3/uL (ref 0.1–1.0)
Monocytes Relative: 9.8 % (ref 3.0–12.0)
Neutro Abs: 4.7 10*3/uL (ref 1.4–7.7)
Neutrophils Relative %: 61.2 % (ref 43.0–77.0)
Platelets: 233 10*3/uL (ref 150.0–400.0)
RBC: 5.48 Mil/uL (ref 4.22–5.81)
RDW: 21.1 % — ABNORMAL HIGH (ref 11.5–15.5)
WBC: 7.7 10*3/uL (ref 4.0–10.5)

## 2022-07-27 NOTE — Telephone Encounter (Signed)
Spoke with pt and he is going to coming in for CBC.  Patty or Ravonda Could you please put that order in?  Thank you, Cyril Mourning

## 2022-07-27 NOTE — Telephone Encounter (Signed)
I added the lab-FYI Gloris Manchester is no longer working here.

## 2022-07-27 NOTE — Telephone Encounter (Signed)
Dr. Rush Landmark,  I spoke with pt regarding CBC order.  He is happy to do this because he was recently turned away from giving blood d/t low iron.  He wanted me to let you know he has been having right sided abdominal pain.  No other issues such as weight loss, nausea, bowel changes.  He wanted me to relay that to you.  He is coming in for his blood draw today.  Thanks, [employer]

## 2022-08-01 ENCOUNTER — Other Ambulatory Visit: Payer: Self-pay

## 2022-08-01 DIAGNOSIS — D649 Anemia, unspecified: Secondary | ICD-10-CM

## 2022-08-02 ENCOUNTER — Other Ambulatory Visit (INDEPENDENT_AMBULATORY_CARE_PROVIDER_SITE_OTHER): Payer: Medicare Other

## 2022-08-02 ENCOUNTER — Other Ambulatory Visit: Payer: Self-pay

## 2022-08-02 DIAGNOSIS — D649 Anemia, unspecified: Secondary | ICD-10-CM

## 2022-08-02 LAB — VITAMIN B12: Vitamin B-12: 190 pg/mL — ABNORMAL LOW (ref 211–911)

## 2022-08-02 LAB — IBC + FERRITIN
Ferritin: 5.5 ng/mL — ABNORMAL LOW (ref 22.0–322.0)
Iron: 45 ug/dL (ref 42–165)
Saturation Ratios: 8.4 % — ABNORMAL LOW (ref 20.0–50.0)
TIBC: 533.4 ug/dL — ABNORMAL HIGH (ref 250.0–450.0)
Transferrin: 381 mg/dL — ABNORMAL HIGH (ref 212.0–360.0)

## 2022-08-02 LAB — RETICULOCYTES
ABS Retic: 90270 cells/uL — ABNORMAL HIGH (ref 25000–90000)
Retic Ct Pct: 1.7 %

## 2022-08-02 LAB — FOLATE: Folate: 15.7 ng/mL (ref >5.9)

## 2022-08-09 ENCOUNTER — Ambulatory Visit (AMBULATORY_SURGERY_CENTER): Payer: Medicare Other | Admitting: *Deleted

## 2022-08-09 ENCOUNTER — Other Ambulatory Visit: Payer: Self-pay

## 2022-08-09 VITALS — Ht 71.0 in | Wt 247.0 lb

## 2022-08-09 DIAGNOSIS — Z86010 Personal history of colonic polyps: Secondary | ICD-10-CM

## 2022-08-09 NOTE — Progress Notes (Signed)
Pre visit completed in person.  No egg or soy allergy known to patient  No issues known to pt with past sedation with any surgeries or procedures Patient denies ever being told they had issues or difficulty with intubation  No FH of Malignant Hyperthermia Pt is not on diet pills Pt is not on  home 02  Pt is not on blood thinners  Pt denies issues with constipation  No A fib or A flutter  Pt instructed to use Singlecare.com or GoodRx for a price reduction on prep   

## 2022-08-18 ENCOUNTER — Encounter: Payer: Self-pay | Admitting: Gastroenterology

## 2022-08-27 ENCOUNTER — Encounter: Payer: Self-pay | Admitting: Certified Registered Nurse Anesthetist

## 2022-08-30 ENCOUNTER — Encounter: Payer: Medicare Other | Admitting: Gastroenterology

## 2022-09-01 ENCOUNTER — Other Ambulatory Visit: Payer: Self-pay | Admitting: Gastroenterology

## 2022-09-01 ENCOUNTER — Encounter: Payer: Self-pay | Admitting: Gastroenterology

## 2022-09-01 ENCOUNTER — Ambulatory Visit (AMBULATORY_SURGERY_CENTER): Payer: Medicare Other | Admitting: Gastroenterology

## 2022-09-01 VITALS — BP 137/85 | HR 70 | Temp 99.1°F | Resp 17 | Ht 71.0 in | Wt 251.2 lb

## 2022-09-01 DIAGNOSIS — R1011 Right upper quadrant pain: Secondary | ICD-10-CM

## 2022-09-01 DIAGNOSIS — R1310 Dysphagia, unspecified: Secondary | ICD-10-CM

## 2022-09-01 DIAGNOSIS — Z86010 Personal history of colonic polyps: Secondary | ICD-10-CM | POA: Diagnosis not present

## 2022-09-01 DIAGNOSIS — D124 Benign neoplasm of descending colon: Secondary | ICD-10-CM | POA: Diagnosis not present

## 2022-09-01 DIAGNOSIS — Z800 Family history of malignant neoplasm of digestive organs: Secondary | ICD-10-CM

## 2022-09-01 DIAGNOSIS — K209 Esophagitis, unspecified without bleeding: Secondary | ICD-10-CM

## 2022-09-01 DIAGNOSIS — D122 Benign neoplasm of ascending colon: Secondary | ICD-10-CM

## 2022-09-01 DIAGNOSIS — D649 Anemia, unspecified: Secondary | ICD-10-CM

## 2022-09-01 DIAGNOSIS — K225 Diverticulum of esophagus, acquired: Secondary | ICD-10-CM

## 2022-09-01 DIAGNOSIS — K22710 Barrett's esophagus with low grade dysplasia: Secondary | ICD-10-CM | POA: Diagnosis not present

## 2022-09-01 DIAGNOSIS — K2270 Barrett's esophagus without dysplasia: Secondary | ICD-10-CM

## 2022-09-01 DIAGNOSIS — K449 Diaphragmatic hernia without obstruction or gangrene: Secondary | ICD-10-CM

## 2022-09-01 DIAGNOSIS — K2970 Gastritis, unspecified, without bleeding: Secondary | ICD-10-CM | POA: Diagnosis not present

## 2022-09-01 DIAGNOSIS — K2100 Gastro-esophageal reflux disease with esophagitis, without bleeding: Secondary | ICD-10-CM | POA: Diagnosis not present

## 2022-09-01 DIAGNOSIS — Z09 Encounter for follow-up examination after completed treatment for conditions other than malignant neoplasm: Secondary | ICD-10-CM | POA: Diagnosis not present

## 2022-09-01 MED ORDER — OMEPRAZOLE 40 MG PO CPDR: 40.0000 mg | DELAYED_RELEASE_CAPSULE | Freq: Two times a day (BID) | ORAL | 6 refills | Status: DC

## 2022-09-01 MED ORDER — SODIUM CHLORIDE 0.9 % IV SOLN: 500.0000 mL | Freq: Once | INTRAVENOUS | Status: DC

## 2022-09-01 MED ORDER — OMEPRAZOLE 40 MG PO CPDR: 40.0000 mg | DELAYED_RELEASE_CAPSULE | Freq: Every day | ORAL | 6 refills | Status: DC

## 2022-09-01 MED ORDER — OMEPRAZOLE 20 MG PO CPDR: 40.0000 mg | DELAYED_RELEASE_CAPSULE | Freq: Two times a day (BID) | ORAL | 6 refills | Status: DC

## 2022-09-01 NOTE — Op Note (Signed)
[facility] Patient Name: Jordan Berg Procedure Date: 09/01/2022 10:24 AM MRN: [id number] Endoscopist: Justice Britain , MD Age: 66 Referring MD:  Date of Birth: [birth year] Gender: Male Account #: [id number] Procedure:                Colonoscopy Indications:              Colon cancer screening in patient at increased                            risk: Colorectal cancer in 2 sisters, Surveillance:                            Personal history of adenomatous polyps on last                            colonoscopy 3 years ago (previous advanced adenoma                            of ascending colon) Medicines:                Monitored Anesthesia Care Procedure:                Pre-Anesthesia Assessment:                           - Prior to the procedure, a History and Physical                            was performed, and patient medications and                            allergies were reviewed. The patient's tolerance of                            previous anesthesia was also reviewed. The risks                            and benefits of the procedure and the sedation                            options and risks were discussed with the patient.                            All questions were answered, and informed consent                            was obtained. Prior Anticoagulants: The patient has                            taken no previous anticoagulant or antiplatelet                            agents. ASA Grade Assessment: II - A patient with  mild systemic disease. After reviewing the risks                            and benefits, the patient was deemed in                            satisfactory condition to undergo the procedure.                           After obtaining informed consent, the colonoscope                            was passed under direct vision. Throughout the                            procedure, the patient's blood pressure,  pulse, and                            oxygen saturations were monitored continuously. The                            CF [id number] #[id number] was introduced through the anus                            and advanced to the the cecum, identified by                            appendiceal orifice and ileocecal valve. The                            colonoscopy was somewhat difficult due to                            significant looping. Successful completion of the                            procedure was aided by changing the patient's                            position, using manual pressure, straightening and                            shortening the scope to obtain bowel loop reduction                            and using scope torsion. The patient tolerated the                            procedure. The quality of the bowel preparation was                            adequate. The ileocecal valve, appendiceal orifice,  and rectum were photographed. Scope In: 10:40:52 AM Scope Out: 10:59:07 AM Scope Withdrawal Time: 0 hours 14 minutes 22 seconds  Total Procedure Duration: 0 hours 18 minutes 15 seconds  Findings:                 The digital rectal exam findings include                            hemorrhoids. Pertinent negatives include no                            palpable rectal lesions.                           The colon (entire examined portion) revealed                            significantly excessive looping.                           Three sessile polyps were found in the descending                            colon (2) and ascending colon (1). The polyps were                            3 to 6 mm in size. These polyps were removed with a                            cold snare. Resection and retrieval were complete.                           Multiple small-mouthed diverticula were found in                            the recto-sigmoid colon and sigmoid colon.                            Normal mucosa was found in the entire colon                            otherwise.                           Non-bleeding non-thrombosed internal hemorrhoids                            were found during retroflexion, during perianal                            exam and during digital exam. The hemorrhoids were                            Grade II (internal hemorrhoids that prolapse but  reduce spontaneously). Complications:            No immediate complications. Estimated Blood Loss:     Estimated blood loss was minimal. Impression:               - Hemorrhoids found on digital rectal exam.                           - There was significant looping of the colon.                           - Three, 3 to 6 mm polyps in the descending colon                            and in the ascending colon, removed with a cold                            snare. Resected and retrieved.                           - Diverticulosis in the recto-sigmoid colon and in                            the sigmoid colon.                           - Normal mucosa in the entire examined colon                            otherwise.                           - Non-bleeding non-thrombosed internal hemorrhoids. Recommendation:           - The patient will be observed post-procedure,                            until all discharge criteria are met.                           - Discharge patient to home.                           - Patient has a contact number available for                            emergencies. The signs and symptoms of potential                            delayed complications were discussed with the                            patient. Return to normal activities tomorrow.                            Written discharge instructions were provided to the  patient.                           - High fiber diet.                           - Use FiberCon  1-2 tablets PO daily.                           - Continue present medications.                           - Await pathology results.                           - Repeat colonoscopy in 3 - 5 years for                            surveillance based on pathology results.                           - Please start oral iron ferrous gluconate or                            ferrous sulfate once daily. We will recheck your                            labs in 2 months. If you still have evidence of                            iron deficiency, we will consider the role of a                            video capsule endoscopy.                           - The findings and recommendations were discussed                            with the patient.                           - The findings and recommendations were discussed                            with the patient's family. Justice Britain, MD 09/01/2022 11:13:56 AM

## 2022-09-01 NOTE — Progress Notes (Signed)
VS completed by DT.  Pt's states no medical or surgical changes since previsit or office visit.  

## 2022-09-01 NOTE — Progress Notes (Signed)
GASTROENTEROLOGY PROCEDURE H&P NOTE   Primary Care Physician: Tammi Sou, MD  HPI: Jordan Berg is a 66 y.o. male who presents for EGD/Colonoscopy for evaluation of IDA, heme positive stool, history of adenomatous colon polyps.  Past Medical History:  Diagnosis Date   Arthritis    mild   Cataract    forming   Colon polyps 01/20/2009; 11/24/18; 04/2019   2010: NON-adenomatous-recall 10 yrs.  11/2018->adenomatous. repeat 05/03/19 mo b/c of piecemeal resection of one of the polyps->small area at 11/2018 polypectomy site bx'd->benign.  +Hx of postpolypectomy bleeding-->colonoscopy was done 3d after 11/2018 polypectomy to fix this.   GERD (gastroesophageal reflux disease)    mild   Helicobacter pylori gastritis 02/2018   EGD+.  Stool antigen NEG after abx treatment.   Hepatic steatosis    Noted on MRI abd 12/2017 (done for RUQ pain). also noted on EUS.   Hypercholesterolemia 12/2017   Recommended statin 12/2017: pt chose [facility] and repeat chol 3 mo. Recommended statin 01/2020.   Kidney stone    Obesity, Class II, BMI 35-39.9    Primary hypogonadism in male    Managed by pt's urologist (including biannual DREs, Hbs and PSAs).   RUQ abdominal pain Dec 2018;Jan 2019   Labs nl, abd plain films nl, MRCP nl.  GI did EUS 2019, normal except chronic gastritis, + H pylori.  Stool antigen test NEG after abx tx.   Past Surgical History:  Procedure Laterality Date   CHOLECYSTECTOMY  2010   COLONOSCOPY     COLONOSCOPY N/A 11/25/2018   Adenomatous: recall 6 mo due to piecemeal resection of one of the polyps.  Procedure: COLONOSCOPY;  Surgeon: Rush Landmark Telford Nab., MD;  Location: [facility];  Service: Gastroenterology;  Laterality: N/A;   COLONOSCOPY  11/28/2018   for post polypectomy bleeding.  Vessel cauterized/clipped   COLONOSCOPY  05/03/2019   f/u view of polypectomy site from 11/2018 was done due to piecemeal resection. Bx ->inflam/benign. Recall 3 yrs.   COLONOSCOPY W/  POLYPECTOMY  01/20/2009   Multiple nonadenomatous polyps.  Recall 10 yrs.   ESOPHAGOGASTRODUODENOSCOPY  02/14/2018   +EUS: Chronic active gastritis, + H PYLORI, tx'd with abx by GI, stool antigen test NEG after abx (04/2018)   EUS N/A 02/14/2018   Procedure: UPPER ENDOSCOPIC ULTRASOUND (EUS) LINEAR;  Surgeon: Milus Banister, MD;  Location: [facility];  Service: Endoscopy;  Laterality: N/A;   HOT HEMOSTASIS N/A 11/25/2018   Procedure: HOT HEMOSTASIS (ARGON PLASMA COAGULATION/BICAP);  Surgeon: Irving Copas., MD;  Location: [facility];  Service: Gastroenterology;  Laterality: N/A;   KNEE SURGERY Left 2000 and 2017   arthroscopic   LUMBAR SPINE SURGERY  2000   No hardware   NASAL SEPTUM SURGERY  1981   POLYPECTOMY     SUBMUCOSAL INJECTION  11/25/2018   Procedure: SUBMUCOSAL INJECTION;  Surgeon: Irving Copas., MD;  Location: [facility];  Service: Gastroenterology;;   TONSILLECTOMY  1978   UPPER GASTROINTESTINAL ENDOSCOPY     Current Outpatient Medications  Medication Sig Dispense Refill   Apoaequorin (PREVAGEN PO) Take by mouth.     carboxymethylcellulose (REFRESH PLUS) 0.5 % SOLN Place 1 drop into both eyes 3 (three) times daily as needed (dry eyes).     cyanocobalamin (VITAMIN B12) 1000 MCG tablet Take 1,000 mcg by mouth daily.     ferrous sulfate 324 MG TBEC Take 324 mg by mouth.     Garlic 6237 MG TBEC Take by mouth.  Misc Natural Products (PROSTATE HEALTH PO) Take 1 capsule by mouth daily.     Omega-3 Fatty Acids (FISH OIL) 1000 MG CAPS Take 1 capsule by mouth daily.      B-D 3CC LUER-LOK SYR 22GX1-1/2 22G X 1-1/2" 3 ML MISC USE AS DIRECTED FOR TESTOSTERONE INJECTIONS     clobetasol cream (TEMOVATE) 6.56 % Apply 1 application topically daily as needed (dry skin). (Patient not taking: Reported on 08/09/2022)     OVER THE COUNTER MEDICATION Take 1 capsule by mouth daily. BioAstin [place] Astaxanthin      psyllium (METAMUCIL) 58.6 % powder Take 1 packet by  mouth 3 (three) times daily.     Salicylic Acid 3 % OINT Apply 1 application topically daily as needed (for remove dry, dead skin).      testosterone cypionate (DEPOTESTOSTERONE CYPIONATE) 200 MG/ML injection Inject 60 mg into the muscle every Thursday.   1   Turmeric (QC TUMERIC COMPLEX PO) Take by mouth.     Current Facility-Administered Medications  Medication Dose Route Frequency Provider Last Rate Last Admin   0.9 %  sodium chloride infusion  500 mL Intravenous Once Mansouraty, Telford Nab., MD        Current Outpatient Medications:    Apoaequorin (PREVAGEN PO), Take by mouth., Disp: , Rfl:    carboxymethylcellulose (REFRESH PLUS) 0.5 % SOLN, Place 1 drop into both eyes 3 (three) times daily as needed (dry eyes)., Disp: , Rfl:    cyanocobalamin (VITAMIN B12) 1000 MCG tablet, Take 1,000 mcg by mouth daily., Disp: , Rfl:    ferrous sulfate 324 MG TBEC, Take 324 mg by mouth., Disp: , Rfl:    Garlic 8127 MG TBEC, Take by mouth., Disp: , Rfl:    Misc Natural Products (PROSTATE HEALTH PO), Take 1 capsule by mouth daily., Disp: , Rfl:    Omega-3 Fatty Acids (FISH OIL) 1000 MG CAPS, Take 1 capsule by mouth daily. , Disp: , Rfl:    B-D 3CC LUER-LOK SYR 22GX1-1/2 22G X 1-1/2" 3 ML MISC, USE AS DIRECTED FOR TESTOSTERONE INJECTIONS, Disp: , Rfl:    clobetasol cream (TEMOVATE) 5.17 %, Apply 1 application topically daily as needed (dry skin). (Patient not taking: Reported on 08/09/2022), Disp: , Rfl:    OVER THE COUNTER MEDICATION, Take 1 capsule by mouth daily. BioAstin Hawaiian Astaxanthin , Disp: , Rfl:    psyllium (METAMUCIL) 58.6 % powder, Take 1 packet by mouth 3 (three) times daily., Disp: , Rfl:    Salicylic Acid 3 % OINT, Apply 1 application topically daily as needed (for remove dry, dead skin). , Disp: , Rfl:    testosterone cypionate (DEPOTESTOSTERONE CYPIONATE) 200 MG/ML injection, Inject 60 mg into the muscle every Thursday. , Disp: , Rfl: 1   Turmeric (QC TUMERIC COMPLEX PO), Take by  mouth., Disp: , Rfl:   Current Facility-Administered Medications:    0.9 %  sodium chloride infusion, 500 mL, Intravenous, Once, Mansouraty, Telford Nab., MD Allergies  Allergen Reactions   Levofloxacin Itching and Other (See Comments)    [place]   Family History  Problem Relation Age of Onset   Arthritis Mother    Depression Mother    Alzheimer's disease Mother    Alcohol abuse Father    Arthritis Father    COPD Father    Hearing loss Father    Heart disease Father    Colon cancer Sister 69   Colon cancer Paternal Aunt    Colon cancer Cousin    Colon polyps  Neg Hx    Esophageal cancer Neg Hx    Rectal cancer Neg Hx    Stomach cancer Neg Hx    Social History   Socioeconomic History   Marital status: Married    Spouse name: Not on file   Number of children: Not on file   Years of education: Not on file   Highest education level: Not on file  Occupational History   Not on file  Tobacco Use   Smoking status: Former    Packs/day: 1.50    Years: 3.00    Total pack years: 4.50    Types: Cigarettes    Quit date: 12/11/1976    Years since quitting: 45.7   Smokeless tobacco: Never  Vaping Use   Vaping Use: Never used  Substance and Sexual Activity   Alcohol use: No   Drug use: No   Sexual activity: Not on file  Other Topics Concern   Not on file  Social History Narrative   Not on file   Social Determinants of Health   Financial Resource Strain: Not on file  Food Insecurity: Not on file  Transportation Needs: Not on file  Physical Activity: Not on file  Stress: Not on file  Social Connections: Not on file  Intimate Partner Violence: Not on file    Physical Exam: Today's Vitals   09/01/22 0950  BP: (!) 143/77  Pulse: 78  Temp: 99.1 F (37.3 C)  TempSrc: Temporal  SpO2: 95%  Weight: 251 lb 3.2 oz (113.9 kg)  Height: '5\' 11"'$  (1.803 m)   Body mass index is 35.04 kg/m. GEN: NAD EYE: Sclerae anicteric ENT: MMM CV: Non-tachycardic GI: Soft,  NT/ND NEURO:  Alert & Oriented x 3  Lab Results: No results for input(s): "WBC", "HGB", "HCT", "PLT" in the last 72 hours. BMET No results for input(s): "NA", "K", "CL", "CO2", "GLUCOSE", "BUN", "CREATININE", "CALCIUM" in the last 72 hours. LFT No results for input(s): "PROT", "ALBUMIN", "AST", "ALT", "ALKPHOS", "BILITOT", "BILIDIR", "IBILI" in the last 72 hours. PT/INR No results for input(s): "LABPROT", "INR" in the last 72 hours.   Impression / Plan: This is a 66 y.o.male who presents for EGD/Colonoscopy for evaluation of IDA, heme positive stool, history of adenomatous colon polyps.  The risks and benefits of endoscopic evaluation/treatment were discussed with the patient and/or family; these include but are not limited to the risk of perforation, infection, bleeding, missed lesions, lack of diagnosis, severe illness requiring hospitalization, as well as anesthesia and sedation related illnesses.  The patient's history has been reviewed, patient examined, no change in status, and deemed stable for procedure.  The patient and/or family is agreeable to proceed.    Justice Britain, MD [facility] Gastroenterology Advanced Endoscopy Office # [phone] t

## 2022-09-01 NOTE — Op Note (Addendum)
[place] Patient Name: Jordan Berg Procedure Date: 09/01/2022 10:25 AM MRN: [id number] Endoscopist: Justice Britain , MD Age: 66 Referring MD:  Date of Birth: September 20, 1956 Gender: Male Account #: [id number] Procedure:                Upper GI endoscopy Indications:              Abdominal pain in the right upper quadrant, Iron                            deficiency anemia Medicines:                Monitored Anesthesia Care Procedure:                Pre-Anesthesia Assessment:                           - Prior to the procedure, a History and Physical                            was performed, and patient medications and                            allergies were reviewed. The patient's tolerance of                            previous anesthesia was also reviewed. The risks                            and benefits of the procedure and the sedation                            options and risks were discussed with the patient.                            All questions were answered, and informed consent                            was obtained. Prior Anticoagulants: The patient has                            taken no previous anticoagulant or antiplatelet                            agents. ASA Grade Assessment: II - A patient with                            mild systemic disease. After reviewing the risks                            and benefits, the patient was deemed in                            satisfactory condition to undergo the procedure.  After obtaining informed consent, the endoscope was                            passed under direct vision. Throughout the                            procedure, the patient's blood pressure, pulse, and                            oxygen saturations were monitored continuously. The                            Endoscope was introduced through the mouth, and                            advanced to the second part of  duodenum. The upper                            GI endoscopy was accomplished without difficulty.                            The patient tolerated the procedure. Scope In: Scope Out: Findings:                 No gross lesions were noted in the proximal                            esophagus and in the mid esophagus.                           LA Grade B (one or more mucosal breaks greater than                            5 mm, not extending between the tops of two mucosal                            folds) esophagitis with no bleeding was found in                            the distal esophagus. The area was slightly eroded                            as well. Biopsies were taken with a cold forceps                            for histology to rule out dysplasia.                           The Z-line was irregular and was found 44 cm from                            the incisors.  After the completion of the EGD, a guidewire was                            placed and the scope was withdrawn. Dilation was                            performed in the esophagus with a Savary dilator                            with no resistance at 18 mm. The dilation site was                            examined following endoscope reinsertion and showed                            no change.                           A 1 cm hiatal hernia was present.                           Patchy mildly erythematous mucosa without bleeding                            was found in the gastric antrum.                           No other gross lesions were noted in the entire                            examined stomach. Biopsies were taken with a cold                            forceps for histology and Helicobacter pylori                            testing.                           No gross lesions were noted in the duodenal bulb,                            in the first portion of the duodenum and in the                             second portion of the duodenum.                           A medium diverticulum was found in the area of the                            papilla.  The major papilla was otherwise normal. Complications:            No immediate complications. Estimated Blood Loss:     Estimated blood loss was minimal. Impression:               - No gross lesions in esophagus proximally.                           - LA Grade B esophagitis with slight erosion                            distally and at the GE junction with no bleeding.                            Biopsied.                           - Z-line irregular, 44 cm from the incisors.                           - Savory dilation up to 18 mm performed with no                            resistance and no change.                           - 1 cm hiatal hernia.                           - Erythematous mucosa in the antrum. No other gross                            lesions in the stomach. Biopsied.                           - No gross lesions in the duodenal bulb, in the                            first portion of the duodenum and in the second                            portion of the duodenum.                           - Duodenal diverticulum in the region of a                            normal-appearing major papilla. Recommendation:           - Proceed to scheduled colonoscopy.                           - Initiate omeprazole 40 mg twice daily for 2                            months and then  go down to omeprazole 40 mg daily.                           - Continue present medications.                           - Await pathology results.                           - Repeat upper endoscopy in 3 to 4 months to ensure                            healing of esophagitis and continued medication                            down titration.                           - The findings and recommendations were discussed                             with the patient.                           - The findings and recommendations were discussed                            with the designated responsible adult. Justice Britain, MD 09/01/2022 11:07:32 AM

## 2022-09-01 NOTE — Progress Notes (Signed)
1022 Robinul 0.1 mg IV given due large amount of secretions upon assessment.  MD made aware, vss 

## 2022-09-01 NOTE — Progress Notes (Signed)
Report given to PACU, vss 

## 2022-09-01 NOTE — Progress Notes (Signed)
Called to room to assist during endoscopic procedure.  Patient ID and intended procedure confirmed with present staff. Received instructions for my participation in the procedure from the performing physician.  

## 2022-09-01 NOTE — Patient Instructions (Addendum)
Initiate omeprazole 40 mg twice daily for 2 months and then go down to omeprazole 40 mg daily. Post dilation diet NPO for 1 hr Clear liquids for 1 hr Soft diet until tomorrow    High fiber diet. - Use FiberCon 1-2 tablets PO daily.   YOU HAD AN ENDOSCOPIC PROCEDURE TODAY: Refer to the procedure report and other information in the discharge instructions given to you for any specific questions about what was found during the examination. If this information does not answer your questions, please call [facility] office at [phone] to clarify.   YOU SHOULD EXPECT: Some feelings of bloating in the abdomen. Passage of more gas than usual. Walking can help get rid of the air that was put into your GI tract during the procedure and reduce the bloating. If you had a lower endoscopy (such as a colonoscopy or flexible sigmoidoscopy) you may notice spotting of blood in your stool or on the toilet paper. Some abdominal soreness may be present for a day or two, also.  DIET: Your first meal following the procedure should be a light meal and then it is ok to progress to your normal diet. A half-sandwich or bowl of soup is an example of a good first meal. Heavy or fried foods are harder to digest and may make you feel nauseous or bloated. Drink plenty of fluids but you should avoid alcoholic beverages for 24 hours. If you had a esophageal dilation, please see attached instructions for diet.    ACTIVITY: Your care partner should take you home directly after the procedure. You should plan to take it easy, moving slowly for the rest of the day. You can resume normal activity the day after the procedure however YOU SHOULD NOT DRIVE, use power tools, machinery or perform tasks that involve climbing or major physical exertion for 24 hours (because of the sedation medicines used during the test).   SYMPTOMS TO REPORT IMMEDIATELY: A gastroenterologist can be reached at any hour. Please call [phone]  for any of  the following symptoms:  Following lower endoscopy (colonoscopy, flexible sigmoidoscopy) Excessive amounts of blood in the stool  Significant tenderness, worsening of abdominal pains  Swelling of the abdomen that is new, acute  Fever of 100 or higher  Following upper endoscopy (EGD, EUS, ERCP, esophageal dilation) Vomiting of blood or coffee ground material  New, significant abdominal pain  New, significant chest pain or pain under the shoulder blades  Painful or persistently difficult swallowing  New shortness of breath  Black, tarry-looking or red, bloody stools  FOLLOW UP:  If any biopsies were taken you will be contacted by phone or by letter within the next 1-3 weeks. Call [phone]  if you have not heard about the biopsies in 3 weeks.  Please also call with any specific questions about appointments or follow up tests.

## 2022-09-04 ENCOUNTER — Telehealth: Payer: Self-pay | Admitting: *Deleted

## 2022-09-04 NOTE — Telephone Encounter (Signed)
  Follow up Call-     09/01/2022    9:50 AM  Call back number  Post procedure Call Back phone  # [phone]  Permission to leave phone message Yes     Patient questions:  Do you have a fever, pain , or abdominal swelling? No. Pain Score  0 *  Have you tolerated food without any problems? Yes.    Have you been able to return to your normal activities? Yes.    Do you have any questions about your discharge instructions: Diet   No. Medications  No. Follow up visit  No.  Do you have questions or concerns about your Care? No.  Actions: * If pain score is 4 or above: No action needed, pain <4.

## 2022-09-08 ENCOUNTER — Encounter: Payer: Self-pay | Admitting: Gastroenterology

## 2022-10-18 ENCOUNTER — Telehealth: Payer: Self-pay | Admitting: Gastroenterology

## 2022-10-18 MED ORDER — OMEPRAZOLE 40 MG PO CPDR: 40.0000 mg | DELAYED_RELEASE_CAPSULE | Freq: Two times a day (BID) | ORAL | 6 refills | Status: DC

## 2022-10-18 NOTE — Telephone Encounter (Signed)
Inbound call from patient wife stating husband has been taking 2 omeprazole a day. State Dr.Mansouraty told patient in recovery room after procedure to take 2 a day for the first 2 months. Patient now has 6 pills left and the pharmacy will not refill because the directions stated take 1 a day. Please advise.

## 2022-10-18 NOTE — Telephone Encounter (Signed)
New prescription has been sent to the pharmacy for twice daily for 1 more week then once daily.  The pt has been advised.

## 2023-01-31 ENCOUNTER — Encounter: Payer: Self-pay | Admitting: Gastroenterology

## 2023-03-26 ENCOUNTER — Encounter: Payer: Self-pay | Admitting: *Deleted

## 2023-04-17 ENCOUNTER — Telehealth: Payer: Self-pay | Admitting: Gastroenterology

## 2023-04-17 NOTE — Telephone Encounter (Signed)
PT wife is calling to find out if PT needs to have more blood work done. They were advised to have labs done at the beginning of the year and were never called. She also wants to know if he needs to continue the omeprazole as well. Requesting call back.

## 2023-04-17 NOTE — Telephone Encounter (Signed)
Left message on machine to call back  

## 2023-04-18 NOTE — Telephone Encounter (Signed)
The pt has been advised that he can come in at his convenience for labs. The order has been entered.  He will continue omeprazole for now.

## 2023-04-19 ENCOUNTER — Other Ambulatory Visit (INDEPENDENT_AMBULATORY_CARE_PROVIDER_SITE_OTHER): Payer: Medicare Other

## 2023-04-19 DIAGNOSIS — D649 Anemia, unspecified: Secondary | ICD-10-CM | POA: Diagnosis not present

## 2023-04-19 LAB — IBC + FERRITIN
Ferritin: 41.4 ng/mL (ref 22.0–322.0)
Iron: 94 ug/dL (ref 42–165)
Saturation Ratios: 25.9 % (ref 20.0–50.0)
TIBC: 362.6 ug/dL (ref 250.0–450.0)
Transferrin: 259 mg/dL (ref 212.0–360.0)

## 2023-04-19 LAB — CBC WITH DIFFERENTIAL/PLATELET
Basophils Absolute: 0.1 10*3/uL (ref 0.0–0.1)
Basophils Relative: 1.1 % (ref 0.0–3.0)
Eosinophils Absolute: 0.5 10*3/uL (ref 0.0–0.7)
Eosinophils Relative: 6.9 % — ABNORMAL HIGH (ref 0.0–5.0)
HCT: 52 % (ref 39.0–52.0)
Hemoglobin: 17.9 g/dL — ABNORMAL HIGH (ref 13.0–17.0)
Lymphocytes Relative: 23.2 % (ref 12.0–46.0)
Lymphs Abs: 1.5 10*3/uL (ref 0.7–4.0)
MCHC: 34.5 g/dL (ref 30.0–36.0)
MCV: 94.3 fl (ref 78.0–100.0)
Monocytes Absolute: 0.6 10*3/uL (ref 0.1–1.0)
Monocytes Relative: 9.8 % (ref 3.0–12.0)
Neutro Abs: 3.9 10*3/uL (ref 1.4–7.7)
Neutrophils Relative %: 59 % (ref 43.0–77.0)
Platelets: 175 10*3/uL (ref 150.0–400.0)
RBC: 5.52 Mil/uL (ref 4.22–5.81)
RDW: 13.7 % (ref 11.5–15.5)
WBC: 6.6 10*3/uL (ref 4.0–10.5)

## 2023-04-26 DIAGNOSIS — L409 Psoriasis, unspecified: Secondary | ICD-10-CM | POA: Insufficient documentation

## 2023-04-27 ENCOUNTER — Telehealth: Payer: Self-pay

## 2023-04-27 NOTE — Telephone Encounter (Signed)
Called pt to see if he has established somewhere else. Pt would like to keep Dr. Milinda Cave as PCP and will call back to schedule. Pt will be a reestablish pt.

## 2023-05-04 ENCOUNTER — Ambulatory Visit (AMBULATORY_SURGERY_CENTER): Payer: Medicare Other

## 2023-05-04 VITALS — Ht 71.0 in | Wt 248.0 lb

## 2023-05-04 DIAGNOSIS — K209 Esophagitis, unspecified without bleeding: Secondary | ICD-10-CM

## 2023-05-04 NOTE — Progress Notes (Signed)
No egg or soy allergy known to patient  No issues known to pt with past sedation with any surgeries or procedures Patient denies ever being told they had issues or difficulty with intubation  No FH of Malignant Hyperthermia Pt is not on diet pills Pt is not on  home 02  Pt is not on blood thinners  Pt denies issues with constipation  No A fib or A flutter Have any cardiac testing pending--no  Pt is ambulatory   Patient's chart reviewed by Cathlyn Parsons CNRA prior to previsit and patient appropriate for the [facility].  Previsit completed and red dot placed by patient's name on their procedure day (on provider's schedule).     PV completed with patient. Instructions sent via mychart and to home address

## 2023-05-18 ENCOUNTER — Encounter: Payer: Self-pay | Admitting: Gastroenterology

## 2023-05-27 ENCOUNTER — Encounter: Payer: Self-pay | Admitting: Certified Registered Nurse Anesthetist

## 2023-06-01 ENCOUNTER — Telehealth: Payer: Self-pay | Admitting: Gastroenterology

## 2023-06-01 ENCOUNTER — Ambulatory Visit (AMBULATORY_SURGERY_CENTER): Payer: Medicare Other | Admitting: Gastroenterology

## 2023-06-01 ENCOUNTER — Encounter: Payer: Self-pay | Admitting: Gastroenterology

## 2023-06-01 ENCOUNTER — Other Ambulatory Visit: Payer: Self-pay

## 2023-06-01 VITALS — BP 118/72 | HR 69 | Temp 98.9°F | Resp 13 | Ht 71.0 in | Wt 248.0 lb

## 2023-06-01 DIAGNOSIS — K5710 Diverticulosis of small intestine without perforation or abscess without bleeding: Secondary | ICD-10-CM

## 2023-06-01 DIAGNOSIS — R1011 Right upper quadrant pain: Secondary | ICD-10-CM

## 2023-06-01 DIAGNOSIS — K22710 Barrett's esophagus with low grade dysplasia: Secondary | ICD-10-CM | POA: Diagnosis not present

## 2023-06-01 DIAGNOSIS — K209 Esophagitis, unspecified without bleeding: Secondary | ICD-10-CM

## 2023-06-01 DIAGNOSIS — K229 Disease of esophagus, unspecified: Secondary | ICD-10-CM

## 2023-06-01 DIAGNOSIS — K449 Diaphragmatic hernia without obstruction or gangrene: Secondary | ICD-10-CM

## 2023-06-01 MED ORDER — SODIUM CHLORIDE 0.9 % IV SOLN
500.0000 mL | Freq: Once | INTRAVENOUS | Status: DC
Start: 2023-06-01 — End: 2024-02-20

## 2023-06-01 MED ORDER — SUCRALFATE 1 GM/10ML PO SUSP
1.0000 g | Freq: Two times a day (BID) | ORAL | 0 refills | Status: DC
Start: 2023-06-01 — End: 2023-06-01

## 2023-06-01 MED ORDER — SUCRALFATE 1 G PO TABS
1.0000 g | ORAL_TABLET | Freq: Two times a day (BID) | ORAL | 0 refills | Status: DC
Start: 2023-06-01 — End: 2024-02-08

## 2023-06-01 MED ORDER — OMEPRAZOLE 40 MG PO CPDR
40.0000 mg | DELAYED_RELEASE_CAPSULE | Freq: Two times a day (BID) | ORAL | 3 refills | Status: DC
Start: 2023-06-01 — End: 2023-11-26

## 2023-06-01 NOTE — Patient Instructions (Addendum)
Patient has a contact number available for                            emergencies. The signs and symptoms of potential                            delayed complications were discussed with the                            patient. Return to normal activities tomorrow.                            Written discharge instructions were provided to the                            patient.                           - Resume previous diet.                           - Increase PPI to 40 mg twice daily and maintain.                           - Carafate 1 g twice daily (liquid versus tablet                            made into slurry) for 1 month. Then may stop.                           - Observe patient's clinical course.                           - Await pathology results.                           - Repeat upper endoscopy in 6 months to check                            healing.                           - Query consideration of further up titration of                            medications or transition of PPI versus hiatal                            hernia repair if patient continues to have                            persisting esophagitis in the future.                           - The findings and recommendations were discussed  with the patient.                           - The findings and recommendations were discussed                            with the patient's family.             YOU HAD AN ENDOSCOPIC PROCEDURE TODAY AT THE [facility]:   Refer to the procedure report that was given to you for any specific questions about what was found during the examination.  If the procedure report does not answer your questions, please call your gastroenterologist to clarify.  If you requested that your care partner not be given the details of your procedure findings, then the procedure report has been included in a sealed envelope for you to review at your  convenience later.  YOU SHOULD EXPECT: Some feelings of bloating in the abdomen. Passage of more gas than usual.  Walking can help get rid of the air that was put into your GI tract during the procedure and reduce the bloating. If you had a lower endoscopy (such as a colonoscopy or flexible sigmoidoscopy) you may notice spotting of blood in your stool or on the toilet paper. If you underwent a bowel prep for your procedure, you may not have a normal bowel movement for a few days.  Please Note:  You might notice some irritation and congestion in your nose or some drainage.  This is from the oxygen used during your procedure.  There is no need for concern and it should clear up in a day or so.  SYMPTOMS TO REPORT IMMEDIATELY:   Following upper endoscopy (EGD)  Vomiting of blood or coffee ground material  New chest pain or pain under the shoulder blades  Painful or persistently difficult swallowing  New shortness of breath  Fever of 100F or higher  Black, tarry-looking stools  For urgent or emergent issues, a gastroenterologist can be reached at any hour by calling [phone]. Do not use MyChart messaging for urgent concerns.    DIET:  We do recommend a small meal at first, but then you may proceed to your regular diet.  Drink plenty of fluids but you should avoid alcoholic beverages for 24 hours.  ACTIVITY:  You should plan to take it easy for the rest of today and you should NOT DRIVE or use heavy machinery until tomorrow (because of the sedation medicines used during the test).    FOLLOW UP: Our staff will call the number listed on your records the next business day following your procedure.  We will call around 7:15- 8:00 am to check on you and address any questions or concerns that you may have regarding the information given to you following your procedure. If we do not reach you, we will leave a message.     If any biopsies were taken you will be contacted by phone or by letter  within the next 1-3 weeks.  Please call us at [phone] if you have not heard about the biopsies in 3 weeks.    SIGNATURES/CONFIDENTIALITY: You and/or your care partner have signed paperwork which will be entered into your electronic medical record.  These signatures attest to the fact that that the information above on your After Visit Summary has been reviewed and is understood.  Full responsibility of the  confidentiality of this discharge information lies with you and/or your care-partner. 

## 2023-06-01 NOTE — Telephone Encounter (Signed)
Inbound call from patients wife stating patient had procedure this morning and was proscribed  Carafate in liquid form and she stated that it was too  expensive and is requesting pill form to be sent to pharmacy. Please advise.

## 2023-06-01 NOTE — Progress Notes (Signed)
Called to room to assist during endoscopic procedure.  Patient ID and intended procedure confirmed with present staff. Received instructions for my participation in the procedure from the performing physician.  

## 2023-06-01 NOTE — Progress Notes (Signed)
GASTROENTEROLOGY PROCEDURE H&P NOTE   Primary Care Physician: Jeoffrey Massed, MD  HPI: Jordan Berg is a 67 y.o. male who presents for EGD for followup of esophagitis.  Past Medical History:  Diagnosis Date   Arthritis    mild   Cataract    forming   Colon polyps 01/20/2009; 11/24/18; 04/2019   2010: NON-adenomatous-recall 10 yrs.  11/2018->adenomatous. repeat 05/03/19 mo b/c of piecemeal resection of one of the polyps->small area at 11/2018 polypectomy site bx'd->benign.  +Hx of postpolypectomy bleeding-->colonoscopy was done 3d after 11/2018 polypectomy to fix this.   GERD (gastroesophageal reflux disease)    mild   Helicobacter pylori gastritis 02/2018   EGD+.  Stool antigen NEG after abx treatment.   Hepatic steatosis    Noted on MRI abd 12/2017 (done for RUQ pain). also noted on EUS.   Hypercholesterolemia 12/2017   Recommended statin 12/2017: pt chose TLC and repeat chol 3 mo. Recommended statin 01/2020.   Kidney stone    Obesity, Class II, BMI 35-39.9    Primary hypogonadism in male    Managed by pt's urologist (including biannual DREs, Hbs and PSAs).   RUQ abdominal pain Dec 2018;Jan 2019   Labs nl, abd plain films nl, MRCP nl.  GI did EUS 2019, normal except chronic gastritis, + H pylori.  Stool antigen test NEG after abx tx.   Past Surgical History:  Procedure Laterality Date   CHOLECYSTECTOMY  2010   COLONOSCOPY     COLONOSCOPY N/A 11/25/2018   Adenomatous: recall 6 mo due to piecemeal resection of one of the polyps.  Procedure: COLONOSCOPY;  Surgeon: Meridee Score Netty Starring., MD;  Location: [facility];  Service: Gastroenterology;  Laterality: N/A;   COLONOSCOPY  11/28/2018   for post polypectomy bleeding.  Vessel cauterized/clipped   COLONOSCOPY  05/03/2019   f/u view of polypectomy site from 11/2018 was done due to piecemeal resection. Bx ->inflam/benign. Recall 3 yrs.   COLONOSCOPY W/ POLYPECTOMY  01/20/2009   Multiple nonadenomatous polyps.  Recall 10  yrs.   ESOPHAGOGASTRODUODENOSCOPY  02/14/2018   +EUS: Chronic active gastritis, + H PYLORI, tx'd with abx by GI, stool antigen test NEG after abx (04/2018)   EUS N/A 02/14/2018   Procedure: UPPER ENDOSCOPIC ULTRASOUND (EUS) LINEAR;  Surgeon: Rachael Fee, MD;  Location: [facility];  Service: Endoscopy;  Laterality: N/A;   HOT HEMOSTASIS N/A 11/25/2018   Procedure: HOT HEMOSTASIS (ARGON PLASMA COAGULATION/BICAP);  Surgeon: Lemar Lofty., MD;  Location: [facility];  Service: Gastroenterology;  Laterality: N/A;   KNEE SURGERY Left 2000 and 2017   arthroscopic   LUMBAR SPINE SURGERY  2000   No hardware   NASAL SEPTUM SURGERY  1981   POLYPECTOMY     SUBMUCOSAL INJECTION  11/25/2018   Procedure: SUBMUCOSAL INJECTION;  Surgeon: Lemar Lofty., MD;  Location: [facility];  Service: Gastroenterology;;   TONSILLECTOMY  1978   UPPER GASTROINTESTINAL ENDOSCOPY     Current Outpatient Medications  Medication Sig Dispense Refill   Apoaequorin (PREVAGEN PO) Take by mouth.     B-D 3CC LUER-LOK SYR 22GX1-1/2 22G X 1-1/2" 3 ML MISC USE AS DIRECTED FOR TESTOSTERONE INJECTIONS     carboxymethylcellulose (REFRESH PLUS) 0.5 % SOLN Place 1 drop into both eyes 3 (three) times daily as needed (dry eyes).     clobetasol cream (TEMOVATE) 0.05 % Apply 1 application topically daily as needed (dry skin). (Patient not taking: Reported on 08/09/2022)     Coenzyme Q10 (CO Q  10 PO) Take by mouth daily. 2 teaspoon     cyanocobalamin (VITAMIN B12) 1000 MCG tablet Take 1,000 mcg by mouth daily.     ferrous sulfate 324 MG TBEC Take 324 mg by mouth.     Garlic 2000 MG TBEC Take by mouth.     Misc Natural Products (PROSTATE HEALTH PO) Take 1 capsule by mouth daily.     Omega-3 Fatty Acids (FISH OIL) 1000 MG CAPS Take 1 capsule by mouth daily.      omeprazole (PRILOSEC) 40 MG capsule Take 1 capsule (40 mg total) by mouth in the morning and at bedtime. For 1 week then once daily 60 capsule 6   psyllium  (METAMUCIL) 58.6 % powder Take 1 packet by mouth 3 (three) times daily.     Salicylic Acid 3 % OINT Apply 1 application topically daily as needed (for remove dry, dead skin).  (Patient not taking: Reported on 05/04/2023)     testosterone cypionate (DEPOTESTOSTERONE CYPIONATE) 200 MG/ML injection Inject 60 mg into the muscle every Thursday.   1   Turmeric (QC TUMERIC COMPLEX PO) Take by mouth.     No current facility-administered medications for this visit.    Current Outpatient Medications:    Apoaequorin (PREVAGEN PO), Take by mouth., Disp: , Rfl:    B-D 3CC LUER-LOK SYR 22GX1-1/2 22G X 1-1/2" 3 ML MISC, USE AS DIRECTED FOR TESTOSTERONE INJECTIONS, Disp: , Rfl:    carboxymethylcellulose (REFRESH PLUS) 0.5 % SOLN, Place 1 drop into both eyes 3 (three) times daily as needed (dry eyes)., Disp: , Rfl:    clobetasol cream (TEMOVATE) 0.05 %, Apply 1 application topically daily as needed (dry skin). (Patient not taking: Reported on 08/09/2022), Disp: , Rfl:    Coenzyme Q10 (CO Q 10 PO), Take by mouth daily. 2 teaspoon, Disp: , Rfl:    cyanocobalamin (VITAMIN B12) 1000 MCG tablet, Take 1,000 mcg by mouth daily., Disp: , Rfl:    ferrous sulfate 324 MG TBEC, Take 324 mg by mouth., Disp: , Rfl:    Garlic 2000 MG TBEC, Take by mouth., Disp: , Rfl:    Misc Natural Products (PROSTATE HEALTH PO), Take 1 capsule by mouth daily., Disp: , Rfl:    Omega-3 Fatty Acids (FISH OIL) 1000 MG CAPS, Take 1 capsule by mouth daily. , Disp: , Rfl:    omeprazole (PRILOSEC) 40 MG capsule, Take 1 capsule (40 mg total) by mouth in the morning and at bedtime. For 1 week then once daily, Disp: 60 capsule, Rfl: 6   psyllium (METAMUCIL) 58.6 % powder, Take 1 packet by mouth 3 (three) times daily., Disp: , Rfl:    Salicylic Acid 3 % OINT, Apply 1 application topically daily as needed (for remove dry, dead skin).  (Patient not taking: Reported on 05/04/2023), Disp: , Rfl:    testosterone cypionate (DEPOTESTOSTERONE CYPIONATE) 200 MG/ML  injection, Inject 60 mg into the muscle every Thursday. , Disp: , Rfl: 1   Turmeric (QC TUMERIC COMPLEX PO), Take by mouth., Disp: , Rfl:  Allergies  Allergen Reactions   Levofloxacin Itching and Other (See Comments)    [place]ps   Family History  Problem Relation Age of Onset   Arthritis Mother    Depression Mother    Alzheimer's disease Mother    Alcohol abuse Father    Arthritis Father    COPD Father    Hearing loss Father    Heart disease Father    Colon cancer Sister 48   Colon  cancer Paternal Aunt    Colon cancer Cousin    Colon polyps Neg Hx    Esophageal cancer Neg Hx    Rectal cancer Neg Hx    Stomach cancer Neg Hx    Social History   Socioeconomic History   Marital status: Married    Spouse name: Not on file   Number of children: Not on file   Years of education: Not on file   Highest education level: Not on file  Occupational History   Not on file  Tobacco Use   Smoking status: Former    Packs/day: 1.50    Years: 3.00    Additional pack years: 0.00    Total pack years: 4.50    Types: Cigarettes    Quit date: 12/11/1976    Years since quitting: 46.5   Smokeless tobacco: Never  Vaping Use   Vaping Use: Never used  Substance and Sexual Activity   Alcohol use: No   Drug use: No   Sexual activity: Not on file  Other Topics Concern   Not on file  Social History Narrative   Not on file   Social Determinants of Health   Financial Resource Strain: Not on file  Food Insecurity: Not on file  Transportation Needs: Not on file  Physical Activity: Not on file  Stress: Not on file  Social Connections: Not on file  Intimate Partner Violence: Not on file    Physical Exam: There were no vitals filed for this visit. There is no height or weight on file to calculate BMI. GEN: NAD EYE: Sclerae anicteric ENT: MMM CV: Non-tachycardic GI: Soft, NT/ND NEURO:  Alert & Oriented x 3  Lab Results: No results for input(s): "WBC", "HGB", "HCT", "PLT" in the last  72 hours. BMET No results for input(s): "NA", "K", "CL", "CO2", "GLUCOSE", "BUN", "CREATININE", "CALCIUM" in the last 72 hours. LFT No results for input(s): "PROT", "ALBUMIN", "AST", "ALT", "ALKPHOS", "BILITOT", "BILIDIR", "IBILI" in the last 72 hours. PT/INR No results for input(s): "LABPROT", "INR" in the last 72 hours.   Impression / Plan: This is a 67 y.o.male who presents for EGD for followup of esophagitis.  The risks and benefits of endoscopic evaluation/treatment were discussed with the patient and/or family; these include but are not limited to the risk of perforation, infection, bleeding, missed lesions, lack of diagnosis, severe illness requiring hospitalization, as well as anesthesia and sedation related illnesses.  The patient's history has been reviewed, patient examined, no change in status, and deemed stable for procedure.  The patient and/or family is agreeable to proceed.    Corliss Parish, MD [facility] Office # [phone]

## 2023-06-01 NOTE — Progress Notes (Signed)
Report given to PACU, vss 

## 2023-06-01 NOTE — Telephone Encounter (Signed)
New prescription for pill Carafate has been send to pharmacy and patient notified.

## 2023-06-01 NOTE — Op Note (Signed)
[facility] Patient Name: Jordan Berg Procedure Date: 06/01/2023 9:35 AM MRN: [id number] Endoscopist: Corliss Parish , MD, [id number] Age: 67 Referring MD:  Date of Birth: 09-10-1956 Gender: Male Account #: [id number] Procedure:                Upper GI endoscopy Indications:              Esophagitis, Follow-up of esophagitis Medicines:                Monitored Anesthesia Care Procedure:                Pre-Anesthesia Assessment:                           - Prior to the procedure, a History and Physical                            was performed, and patient medications and                            allergies were reviewed. The patient's tolerance of                            previous anesthesia was also reviewed. The risks                            and benefits of the procedure and the sedation                            options and risks were discussed with the patient.                            All questions were answered, and informed consent                            was obtained. Prior Anticoagulants: The patient has                            taken no anticoagulant or antiplatelet agents. ASA                            Grade Assessment: II - A patient with mild systemic                            disease. After reviewing the risks and benefits,                            the patient was deemed in satisfactory condition to                            undergo the procedure.                           After obtaining informed consent, the endoscope was  passed under direct vision. Throughout the                            procedure, the patient's blood pressure, pulse, and                            oxygen saturations were monitored continuously. The                            Olympus Scope [id number] was introduced through the                            mouth, and advanced to the second part of duodenum.                            The upper GI  endoscopy was accomplished without                            difficulty. The patient tolerated the procedure. Scope In: Scope Out: Findings:                 No gross lesions were noted in the proximal                            esophagus and in the mid esophagus.                           LA Grade B (one or more mucosal breaks greater than                            5 mm, not extending between the tops of two mucosal                            folds) esophagitis with no bleeding was found in                            the very distal esophagus extending to the GE                            junction. This looks similar to slightly more than                            when this was evaluated previously. Clear Barrett's                            esophagus is not found. Biopsies were taken with a                            cold forceps for histology.                           The Z-line was irregular and was found 41 cm from  the incisors.                           A 2 cm hiatal hernia was present.                           No gross lesions were noted in the entire examined                            stomach.                           A medium non-bleeding diverticulum was found in the                            area of the papilla.                           No other gross lesions were noted in the duodenal                            bulb, in the first portion of the duodenum and in                            the second portion of the duodenum. Complications:            No immediate complications. Estimated Blood Loss:     Estimated blood loss was minimal. Impression:               - No gross lesions in the proximal esophagus and in                            the mid esophagus.                           - LA Grade B erosive esophagitis with no bleeding                            found in the very distal esophagus extending to the                            GE  junction (similar if not slightly more than last                            endoscopy). Biopsied.                           - Z-line irregular, 41 cm from the incisors.                           - 2 cm hiatal hernia.                           - No gross lesions in the entire stomach.                           -  Non-bleeding duodenal diverticulum at area of the                            ampulla.                           - No other gross lesions in the duodenal bulb, in                            the first portion of the duodenum and in the second                            portion of the duodenum. Recommendation:           - The patient will be observed post-procedure,                            until all discharge criteria are met.                           - Discharge patient to home.                           - Patient has a contact number available for                            emergencies. The signs and symptoms of potential                            delayed complications were discussed with the                            patient. Return to normal activities tomorrow.                            Written discharge instructions were provided to the                            patient.                           - Resume previous diet.                           - Increase PPI to 40 mg twice daily and maintain.                           - Carafate 1 g twice daily (liquid versus tablet                            made into slurry) for 1 month. Then may stop.                           - Observe patient's clinical course.                           -  Await pathology results.                           - Repeat upper endoscopy in 6 months to check                            healing.                           - Query consideration of further up titration of                            medications or transition of PPI versus hiatal                            hernia repair if patient continues to  have                            persisting esophagitis in the future.                           - The findings and recommendations were discussed                            with the patient.                           - The findings and recommendations were discussed                            with the patient's family. Corliss Parish, MD 06/01/2023 10:09:24 AM

## 2023-06-05 ENCOUNTER — Telehealth: Payer: Self-pay

## 2023-06-05 NOTE — Telephone Encounter (Signed)
  Follow up Call-     06/01/2023    9:15 AM 09/01/2022    9:50 AM  Call back number  Post procedure Call Back phone  # [phone]  Permission to leave phone message Yes Yes     Patient questions:  Do you have a fever, pain , or abdominal swelling? No. Pain Score  0 *  Have you tolerated food without any problems? Yes.    Have you been able to return to your normal activities? Yes.    Do you have any questions about your discharge instructions: Diet   No. Medications  No. Follow up visit  No.  Do you have questions or concerns about your Care? No.  Actions: * If pain score is 4 or above: No action needed, pain <4.

## 2023-06-17 ENCOUNTER — Encounter: Payer: Self-pay | Admitting: Gastroenterology

## 2023-07-11 ENCOUNTER — Other Ambulatory Visit (INDEPENDENT_AMBULATORY_CARE_PROVIDER_SITE_OTHER): Payer: Medicare Other

## 2023-07-11 DIAGNOSIS — R1011 Right upper quadrant pain: Secondary | ICD-10-CM

## 2023-07-11 LAB — COMPREHENSIVE METABOLIC PANEL
ALT: 31 U/L (ref 0–53)
AST: 26 U/L (ref 0–37)
Albumin: 4.3 g/dL (ref 3.5–5.2)
Alkaline Phosphatase: 38 U/L — ABNORMAL LOW (ref 39–117)
BUN: 18 mg/dL (ref 6–23)
CO2: 30 mEq/L (ref 19–32)
Calcium: 9.3 mg/dL (ref 8.4–10.5)
Chloride: 102 mEq/L (ref 96–112)
Creatinine, Ser: 1.3 mg/dL (ref 0.40–1.50)
GFR: 56.4 mL/min — ABNORMAL LOW (ref >60.00)
Glucose, Bld: 122 mg/dL — ABNORMAL HIGH (ref 70–99)
Potassium: 3.7 mEq/L (ref 3.5–5.1)
Sodium: 139 mEq/L (ref 135–145)
Total Bilirubin: 1 mg/dL (ref 0.2–1.2)
Total Protein: 7.2 g/dL (ref 6.0–8.3)

## 2023-07-11 LAB — AMYLASE: Amylase: 45 U/L (ref 27–131)

## 2023-07-11 LAB — LIPASE: Lipase: 30 U/L (ref 11.0–59.0)

## 2023-10-25 ENCOUNTER — Ambulatory Visit: Payer: Medicare Other | Admitting: Podiatry

## 2023-10-25 ENCOUNTER — Encounter: Payer: Self-pay | Admitting: Podiatry

## 2023-10-25 DIAGNOSIS — B351 Tinea unguium: Secondary | ICD-10-CM | POA: Diagnosis not present

## 2023-10-25 NOTE — Progress Notes (Signed)
  Subjective:  Patient ID: Jordan Berg, male    DOB: 10-Jun-1956,   MRN: [id number]  No chief complaint on file.   67 y.o. male presents for concern of fungal nails. Relates bilateral great toenails and fifth digit nails are thickened and discolored mostly the right great. Has tried some OTC treatments with some help but nails have never truly improved.   . Denies any other pedal complaints. Denies n/v/f/c.   Past Medical History:  Diagnosis Date   Arthritis    mild   Cataract    forming   Colon polyps 01/20/2009; 11/24/18; 04/2019   2010: NON-adenomatous-recall 10 yrs.  11/2018->adenomatous. repeat 05/03/19 mo b/c of piecemeal resection of one of the polyps->small area at 11/2018 polypectomy site bx'd->benign.  +Hx of postpolypectomy bleeding-->colonoscopy was done 3d after 11/2018 polypectomy to fix this.   GERD (gastroesophageal reflux disease)    mild   Helicobacter pylori gastritis 02/2018   EGD+.  Stool antigen NEG after abx treatment.   Hepatic steatosis    Noted on MRI abd 12/2017 (done for RUQ pain). also noted on EUS.   Hypercholesterolemia 12/2017   Recommended statin 12/2017: pt chose TLC and repeat chol 3 mo. Recommended statin 01/2020.   Kidney stone    Obesity, Class II, BMI 35-39.9    Primary hypogonadism in male    Managed by pt's urologist (including biannual DREs, Hbs and PSAs).   RUQ abdominal pain Dec 2018;Jan 2019   Labs nl, abd plain films nl, MRCP nl.  GI did EUS 2019, normal except chronic gastritis, + H pylori.  Stool antigen test NEG after abx tx.    Objective:  Physical Exam: Vascular: DP/PT pulses 2/4 bilateral. CFT <3 seconds. Normal hair growth on digits. No edema.  Skin. No lacerations or abrasions bilateral feet. Bilateral hallux and fifth digits with thickened and subungual debris. Right hallux with discoloration about 3/4 of the way back to proximal nail fold.  Musculoskeletal: MMT 5/5 bilateral lower extremities in DF, PF, Inversion and  Eversion. Deceased ROM in DF of ankle joint.  Neurological: Sensation intact to light touch.   Assessment:  No diagnosis found.   Plan:  Patient was evaluated and treated and all questions answered. -Examined patient -Discussed treatment options for painful dystrophic nails  -Clinical picture and Fungal culture was obtained by removing a portion of the hard nail itself from each of the involved toenails using a sterile nail nipper and sent to [facility] lab. Patient tolerated the biopsy procedure well without discomfort or need for anesthesia.  -Discussed fungal nail treatment options including oral, topical, and laser treatments.  -Patient to return in 4 weeks for follow up evaluation and discussion of fungal culture results or sooner if symptoms worsen.   Louann Sjogren, DPM

## 2023-10-25 NOTE — Addendum Note (Signed)
Addended by: Daryel November on: 10/25/2023 10:19 AM   Modules accepted: Orders

## 2023-11-12 ENCOUNTER — Other Ambulatory Visit: Payer: Self-pay | Admitting: Podiatry

## 2023-11-15 ENCOUNTER — Ambulatory Visit: Payer: Medicare Other | Admitting: Podiatry

## 2023-11-15 DIAGNOSIS — B351 Tinea unguium: Secondary | ICD-10-CM

## 2023-11-15 NOTE — Progress Notes (Signed)
  Subjective:  Patient ID: Kendell Bane, male    DOB: 11-03-1956,   MRN: [id number]  No chief complaint on file.   67 y.o. male presents for follow-up of fungal nails and to review culture results. . Denies any other pedal complaints. Denies n/v/f/c.   Past Medical History:  Diagnosis Date   Arthritis    mild   Cataract    forming   Colon polyps 01/20/2009; 11/24/18; 04/2019   2010: NON-adenomatous-recall 10 yrs.  11/2018->adenomatous. repeat 05/03/19 mo b/c of piecemeal resection of one of the polyps->small area at 11/2018 polypectomy site bx'd->benign.  +Hx of postpolypectomy bleeding-->colonoscopy was done 3d after 11/2018 polypectomy to fix this.   GERD (gastroesophageal reflux disease)    mild   Helicobacter pylori gastritis 02/2018   EGD+.  Stool antigen NEG after abx treatment.   Hepatic steatosis    Noted on MRI abd 12/2017 (done for RUQ pain). also noted on EUS.   Hypercholesterolemia 12/2017   Recommended statin 12/2017: pt chose TLC and repeat chol 3 mo. Recommended statin 01/2020.   Kidney stone    Obesity, Class II, BMI 35-39.9    Primary hypogonadism in male    Managed by pt's urologist (including biannual DREs, Hbs and PSAs).   RUQ abdominal pain Dec 2018;Jan 2019   Labs nl, abd plain films nl, MRCP nl.  GI did EUS 2019, normal except chronic gastritis, + H pylori.  Stool antigen test NEG after abx tx.    Objective:  Physical Exam: Vascular: DP/PT pulses 2/4 bilateral. CFT <3 seconds. Normal hair growth on digits. No edema.  Skin. No lacerations or abrasions bilateral feet. Bilateral hallux and fifth digits with thickened and subungual debris. Right hallux with discoloration about 3/4 of the way back to proximal nail fold.  Musculoskeletal: MMT 5/5 bilateral lower extremities in DF, PF, Inversion and Eversion. Deceased ROM in DF of ankle joint.  Neurological: Sensation intact to light touch.   Assessment:   1. Onychomycosis      Plan:  Patient was  evaluated and treated and all questions answered. -Examined patient -Discussed treatment options for painful dystrophic nails  -Culture positive for fungus and some trauma.  -Discussed fungal nail treatment options including oral, topical, and laser treatments.  -Patient would like to try laser treatment and will get scheduled.  -Discussed urea nail gel.  -Patient to return in 3 months for recheck   Louann Sjogren, DPM

## 2023-11-16 ENCOUNTER — Ambulatory Visit (INDEPENDENT_AMBULATORY_CARE_PROVIDER_SITE_OTHER): Payer: Medicare Other | Admitting: Podiatrist

## 2023-11-16 ENCOUNTER — Other Ambulatory Visit: Payer: Self-pay | Admitting: Podiatry

## 2023-11-16 DIAGNOSIS — B351 Tinea unguium: Secondary | ICD-10-CM

## 2023-11-16 MED ORDER — CICLOPIROX 8 % EX SOLN: Freq: Every day | CUTANEOUS | 0 refills | Status: DC

## 2023-11-16 NOTE — Progress Notes (Signed)
Patient presents today for the laser treatment # 1. Diagnosed with mycotic nail infection by Dr. Geralynn Rile most affected are bilateral first and bilateral fifth-  remainder of nails discolored.   All other systems are negative.  Patients nails were filed thin using a sterile burr.    Laser therapy via [facility] Laser therapy system was admininstered to affected toenails x 10 with particular attention paid to bilateral hallux nails and bilateral fifth toenails .  The Patient tolerated the treatment well.   All safety precautions were in place.     Follow up in 4 weeks for laser # 2

## 2023-11-19 ENCOUNTER — Encounter: Payer: Self-pay | Admitting: Gastroenterology

## 2023-11-25 ENCOUNTER — Other Ambulatory Visit: Payer: Self-pay | Admitting: Gastroenterology

## 2023-11-26 ENCOUNTER — Other Ambulatory Visit: Payer: Self-pay

## 2023-11-26 DIAGNOSIS — R1011 Right upper quadrant pain: Secondary | ICD-10-CM

## 2023-11-26 DIAGNOSIS — K209 Esophagitis, unspecified without bleeding: Secondary | ICD-10-CM

## 2023-11-26 MED ORDER — OMEPRAZOLE 40 MG PO CPDR: 40.0000 mg | DELAYED_RELEASE_CAPSULE | Freq: Two times a day (BID) | ORAL | 3 refills | Status: DC

## 2023-12-18 ENCOUNTER — Other Ambulatory Visit: Payer: Self-pay | Admitting: Podiatry

## 2023-12-19 ENCOUNTER — Telehealth: Payer: Self-pay

## 2023-12-19 DIAGNOSIS — D649 Anemia, unspecified: Secondary | ICD-10-CM

## 2023-12-19 NOTE — Telephone Encounter (Signed)
 Lab orders entered   The patient has been notified of this information and all questions answered.

## 2023-12-19 NOTE — Telephone Encounter (Signed)
-----   Message from Nurse Jermain Curt P sent at 12/13/2023  9:54 AM EST -----  ----- Message ----- From: Anitra Odetta CROME, RN Sent: 12/12/2023  12:00 AM EST To: Odetta CROME Anitra, RN   ----- Message ----- From: Anitra Odetta CROME, RN Sent: 10/21/2023  12:00 AM EST To: Odetta CROME Anitra, RN  Recheck labs in 61-months (CBC/Iron/TIBC/Ferritin).

## 2024-01-04 ENCOUNTER — Ambulatory Visit (INDEPENDENT_AMBULATORY_CARE_PROVIDER_SITE_OTHER): Payer: Medicare Other | Admitting: Podiatrist

## 2024-01-04 ENCOUNTER — Other Ambulatory Visit (INDEPENDENT_AMBULATORY_CARE_PROVIDER_SITE_OTHER): Payer: Medicare Other

## 2024-01-04 DIAGNOSIS — D649 Anemia, unspecified: Secondary | ICD-10-CM | POA: Diagnosis not present

## 2024-01-04 DIAGNOSIS — B351 Tinea unguium: Secondary | ICD-10-CM

## 2024-01-04 LAB — CBC WITH DIFFERENTIAL/PLATELET
Basophils Absolute: 0 10*3/uL (ref 0.0–0.1)
Basophils Relative: 0.7 % (ref 0.0–3.0)
Eosinophils Absolute: 0.4 10*3/uL (ref 0.0–0.7)
Eosinophils Relative: 5.6 % — ABNORMAL HIGH (ref 0.0–5.0)
HCT: 50.9 % (ref 39.0–52.0)
Hemoglobin: 17.3 g/dL — ABNORMAL HIGH (ref 13.0–17.0)
Lymphocytes Relative: 24.1 % (ref 12.0–46.0)
Lymphs Abs: 1.7 10*3/uL (ref 0.7–4.0)
MCHC: 33.9 g/dL (ref 30.0–36.0)
MCV: 96.5 fL (ref 78.0–100.0)
Monocytes Absolute: 0.7 10*3/uL (ref 0.1–1.0)
Monocytes Relative: 10.2 % (ref 3.0–12.0)
Neutro Abs: 4.3 10*3/uL (ref 1.4–7.7)
Neutrophils Relative %: 59.4 % (ref 43.0–77.0)
Platelets: 178 10*3/uL (ref 150.0–400.0)
RBC: 5.28 Mil/uL (ref 4.22–5.81)
RDW: 13.6 % (ref 11.5–15.5)
WBC: 7.2 10*3/uL (ref 4.0–10.5)

## 2024-01-04 LAB — IBC + FERRITIN
Ferritin: 105.1 ng/mL (ref 22.0–322.0)
Iron: 116 ug/dL (ref 42–165)
Saturation Ratios: 33.4 % (ref 20.0–50.0)
TIBC: 347.2 ug/dL (ref 250.0–450.0)
Transferrin: 248 mg/dL (ref 212.0–360.0)

## 2024-01-04 NOTE — Progress Notes (Signed)
Patient presents today for the laser treatment # 2. Diagnosed with mycotic nail infection by Dr. Ralene Cork. He states he is happy with the nails as they look much improved from his first laser visit-  he has been applying penlac nail laquer at home.    Toenail most affected are bilateral first and bilateral fifth-  remainder of nails discolored.   All other systems are negative.  Patients nails were filed thin using a sterile burr.    Laser therapy via [facility] Laser therapy system was admininstered to affected toenails x 10 with particular attention paid to bilateral hallux nails and bilateral fifth toenails .  The Patient tolerated the treatment well.   All safety precautions were in place.    Follow up in 4 weeks for laser # 3  Make appt for 4 weeks  Note I have asked Dawn to go ahead and schedule all of his laser appointments for the treatment duration of 8 laser visits.

## 2024-01-09 ENCOUNTER — Encounter: Payer: Self-pay | Admitting: Gastroenterology

## 2024-01-28 ENCOUNTER — Other Ambulatory Visit: Payer: Self-pay | Admitting: Podiatry

## 2024-01-28 NOTE — Telephone Encounter (Signed)
 Pts wife called to refill ciclopirox medication, hopes to get it before the snow on wed. Also would like to know if refills can be put on the order as well since he needs to take the medication until August. Preferred pharmacy is [facility] in [place].

## 2024-02-01 ENCOUNTER — Ambulatory Visit (INDEPENDENT_AMBULATORY_CARE_PROVIDER_SITE_OTHER): Payer: Medicare Other | Admitting: Podiatrist

## 2024-02-01 DIAGNOSIS — B351 Tinea unguium: Secondary | ICD-10-CM

## 2024-02-01 NOTE — Progress Notes (Signed)
 Patient presents today for the laser treatment # 3. Diagnosed with mycotic nail infection by Dr. Ralene Cork. He states he is happy with the nails as they look much improved from his first laser visit-  he has been applying penlac nail laquer at home.    Toenail most affected are bilateral first and bilateral fifth-  remainder of nails discolored.   All other systems are negative.  Patients nails were filed thin using a sterile burr.    Laser therapy via [facility] Laser therapy system was admininstered to affected toenails x 10 with particular attention paid to bilateral hallux nails and bilateral fifth toenails .  The Patient tolerated the treatment well.   All safety precautions were in place.    Follow up in 4 weeks for laser # 4    Note-  his laser appointments have been made already for the 8 x treatment course.

## 2024-02-05 DIAGNOSIS — Z87891 Personal history of nicotine dependence: Secondary | ICD-10-CM | POA: Diagnosis not present

## 2024-02-05 DIAGNOSIS — R079 Chest pain, unspecified: Secondary | ICD-10-CM | POA: Diagnosis not present

## 2024-02-05 DIAGNOSIS — R519 Headache, unspecified: Secondary | ICD-10-CM | POA: Diagnosis not present

## 2024-02-05 DIAGNOSIS — F17210 Nicotine dependence, cigarettes, uncomplicated: Secondary | ICD-10-CM | POA: Diagnosis not present

## 2024-02-05 DIAGNOSIS — R6889 Other general symptoms and signs: Secondary | ICD-10-CM | POA: Diagnosis not present

## 2024-02-05 DIAGNOSIS — J3489 Other specified disorders of nose and nasal sinuses: Secondary | ICD-10-CM | POA: Diagnosis not present

## 2024-02-05 DIAGNOSIS — K219 Gastro-esophageal reflux disease without esophagitis: Secondary | ICD-10-CM | POA: Diagnosis not present

## 2024-02-05 DIAGNOSIS — R0602 Shortness of breath: Secondary | ICD-10-CM | POA: Diagnosis not present

## 2024-02-05 DIAGNOSIS — R7989 Other specified abnormal findings of blood chemistry: Secondary | ICD-10-CM | POA: Diagnosis not present

## 2024-02-06 ENCOUNTER — Encounter: Payer: Self-pay | Admitting: Gastroenterology

## 2024-02-08 ENCOUNTER — Ambulatory Visit: Payer: Medicare Other

## 2024-02-08 VITALS — Ht 71.0 in | Wt 250.0 lb

## 2024-02-08 DIAGNOSIS — K22710 Barrett's esophagus with low grade dysplasia: Secondary | ICD-10-CM

## 2024-02-08 DIAGNOSIS — K209 Esophagitis, unspecified without bleeding: Secondary | ICD-10-CM

## 2024-02-08 NOTE — Progress Notes (Signed)

## 2024-02-14 ENCOUNTER — Encounter: Payer: Self-pay | Admitting: Gastroenterology

## 2024-02-14 ENCOUNTER — Ambulatory Visit: Payer: Medicare Other | Admitting: Podiatry

## 2024-02-14 DIAGNOSIS — B351 Tinea unguium: Secondary | ICD-10-CM

## 2024-02-14 MED ORDER — CICLOPIROX 8 % EX SOLN: Freq: Every day | CUTANEOUS | 7 refills | Status: DC

## 2024-02-14 NOTE — Progress Notes (Signed)
  Subjective:  Patient ID: Jordan Berg, male    DOB: June 09, 1956,   MRN: [id number]  No chief complaint on file.   68 y.o. male presents for follow-up of fungal nails and to see progress with laser treatments. Has undergone 3 treatments thusfar . Denies any other pedal complaints. Denies n/v/f/c.   Past Medical History:  Diagnosis Date   Arthritis    mild   Cataract    forming   Colon polyps 01/20/2009; 11/24/18; 04/2019   2010: NON-adenomatous-recall 10 yrs.  11/2018->adenomatous. repeat 05/03/19 mo b/c of piecemeal resection of one of the polyps->small area at 11/2018 polypectomy site bx'd->benign.  +Hx of postpolypectomy bleeding-->colonoscopy was done 3d after 11/2018 polypectomy to fix this.   GERD (gastroesophageal reflux disease)    mild   Helicobacter pylori gastritis 02/2018   EGD+.  Stool antigen NEG after abx treatment.   Hepatic steatosis    Noted on MRI abd 12/2017 (done for RUQ pain). also noted on EUS.   Hypercholesterolemia 12/2017   Recommended statin 12/2017: pt chose TLC and repeat chol 3 mo. Recommended statin 01/2020.   Kidney stone    Obesity, Class II, BMI 35-39.9    Primary hypogonadism in male    Managed by pt's urologist (including biannual DREs, Hbs and PSAs).   RUQ abdominal pain Dec 2018;Jan 2019   Labs nl, abd plain films nl, MRCP nl.  GI did EUS 2019, normal except chronic gastritis, + H pylori.  Stool antigen test NEG after abx tx.    Objective:  Physical Exam: Vascular: DP/PT pulses 2/4 bilateral. CFT <3 seconds. Normal hair growth on digits. No edema.  Skin. No lacerations or abrasions bilateral feet. Bilateral hallux and fifth digits with thickened and subungual debris. Right hallux with discoloration about 1/3 of the way back to proximal nail fold.  Musculoskeletal: MMT 5/5 bilateral lower extremities in DF, PF, Inversion and Eversion. Deceased ROM in DF of ankle joint.  Neurological: Sensation intact to light touch.   Assessment:   1.  Onychomycosis      Plan:  Patient was evaluated and treated and all questions answered. -Examined patient -Discussed treatment options for painful dystrophic nails  -Culture positive for fungus and some trauma.  -Discussed fungal nail treatment options including oral, topical, and laser treatments.  -Continue laser and penlac -Patient to return in 4 months for recheck   Louann Sjogren, DPM

## 2024-02-20 ENCOUNTER — Ambulatory Visit: Payer: Medicare Other | Admitting: Gastroenterology

## 2024-02-20 ENCOUNTER — Encounter: Payer: Self-pay | Admitting: Gastroenterology

## 2024-02-20 VITALS — BP 136/85 | HR 86 | Temp 98.4°F | Resp 16 | Ht 71.0 in | Wt 250.0 lb

## 2024-02-20 DIAGNOSIS — K22710 Barrett's esophagus with low grade dysplasia: Secondary | ICD-10-CM | POA: Diagnosis not present

## 2024-02-20 DIAGNOSIS — K22711 Barrett's esophagus with high grade dysplasia: Secondary | ICD-10-CM | POA: Diagnosis not present

## 2024-02-20 DIAGNOSIS — K2289 Other specified disease of esophagus: Secondary | ICD-10-CM

## 2024-02-20 DIAGNOSIS — K209 Esophagitis, unspecified without bleeding: Secondary | ICD-10-CM

## 2024-02-20 MED ORDER — SODIUM CHLORIDE 0.9 % IV SOLN: 500.0000 mL | Freq: Once | INTRAVENOUS | Status: DC

## 2024-02-20 NOTE — Progress Notes (Signed)
 Called to room to assist during endoscopic procedure.  Patient ID and intended procedure confirmed with present staff. Received instructions for my participation in the procedure from the performing physician.

## 2024-02-20 NOTE — Op Note (Signed)
 [facility] Patient Name: Jordan Berg Procedure Date: 02/20/2024 12:32 PM MRN: [id number] Endoscopist: Corliss Parish , MD, [id number] Age: 68 Referring MD:  Date of Birth: 28-Dec-1955 Gender: Male Account #: [id number] Procedure:                Upper GI endoscopy Indications:              Dysphagia, Barrett's esophagus with low grade                            dysplasia, Follow-up of esophagitis Medicines:                Monitored Anesthesia Care Procedure:                Pre-Anesthesia Assessment:                           - Prior to the procedure, a History and Physical                            was performed, and patient medications and                            allergies were reviewed. The patient's tolerance of                            previous anesthesia was also reviewed. The risks                            and benefits of the procedure and the sedation                            options and risks were discussed with the patient.                            All questions were answered, and informed consent                            was obtained. Prior Anticoagulants: The patient has                            taken no anticoagulant or antiplatelet agents. ASA                            Grade Assessment: II - A patient with mild systemic                            disease. After reviewing the risks and benefits,                            the patient was deemed in satisfactory condition to                            undergo the procedure.  After obtaining informed consent, the endoscope was                            passed under direct vision. Throughout the                            procedure, the patient's blood pressure, pulse, and                            oxygen saturations were monitored continuously. The                            GIF [id number] #[id number] was introduced through the                            mouth, and advanced to  the second part of duodenum.                            The upper GI endoscopy was accomplished without                            difficulty. The patient tolerated the procedure. Scope In: Scope Out: Findings:                 No gross lesions were noted in the proximal                            esophagus and in the mid esophagus.                           The esophagus and gastroesophageal junction were                            examined with white light and narrow band imaging                            (NBI) from a forward view and retroflexed position.                            There was an esophageal mucosal change suspicious                            for short-segment Barrett's esophagus. This change                            involved the mucosa along an irregular Z-line (43                            cm from the incisors). A single island of                            salmon-colored mucosa were present at 42 cm (this  is at the area of previous esophagitis). The                            maximum longitudinal extent of these esophageal                            mucosal changes was 1.5 cm in length. Biopsies were                            taken with a cold forceps for histology from the                            [place].                           After the rest of the EGD was completed, a                            guidewire was placed and the scope was withdrawn.                            Dilation was performed in the esophagus with a                            Savary dilator with no resistance at 18 mm. The                            dilation site was examined following endoscope                            reinsertion and showed no change.                           No gross lesions were noted in the entire examined                            stomach.                           No gross lesions were noted in the duodenal bulb,                             in the first portion of the duodenum and in the                            second portion of the duodenum. Complications:            No immediate complications. Estimated Blood Loss:     Estimated blood loss was minimal. Impression:               - No gross lesions in the proximal esophagus and in                            the mid esophagus.                           -  Esophageal mucosal changes suspicious for                            short-segment Barrett's esophagus found distally.                            Biopsied.                           - Dilation performed in the esophagus performed up                            to 18 mm.                           - No gross lesions in the entire stomach.                           - No gross lesions in the duodenal bulb, in the                            first portion of the duodenum and in the second                            portion of the duodenum. Recommendation:           - The patient will be observed post-procedure,                            until all discharge criteria are met.                           - Discharge patient to home.                           - Patient has a contact number available for                            emergencies. The signs and symptoms of potential                            delayed complications were discussed with the                            patient. Return to normal activities tomorrow.                            Written discharge instructions were provided to the                            patient.                           - Resume previous diet.                           -  Continue present medications.                           - Await pathology results.                           - If evidence of Barrett's esophagus with low-grade                            dysplasia is found, will need to discuss with                            patient consideration of high risk surveillance                             versus ablation techniques.                           - If evidence of Barrett's esophagus with                            high-grade dysplasia is found, will need to discuss                            and move forward with ablation techniques.                           - If no evidence of Barrett's esophagus, would                            recommend repeat endoscopy in 6 to 12 months to                            reevaluate the site.                           - Repeat upper endoscopy for surveillance based on                            pathology results.                           - The findings and recommendations were discussed                            with the patient.                           - The findings and recommendations were discussed                            with the designated responsible adult. Corliss Parish, MD 02/20/2024 1:54:07 PM

## 2024-02-20 NOTE — Progress Notes (Signed)
 GASTROENTEROLOGY PROCEDURE H&P NOTE   Primary Care Physician: Jeoffrey Massed, MD  HPI: Jordan Berg is a 68 y.o. male who presents for EGD for followup of esophagitis and Barrett's esophagus findings with LGD in 2024.  Past Medical History:  Diagnosis Date   Arthritis    mild   Cataract    forming   Colon polyps 01/20/2009; 11/24/18; 04/2019   2010: NON-adenomatous-recall 10 yrs.  11/2018->adenomatous. repeat 05/03/19 mo b/c of piecemeal resection of one of the polyps->small area at 11/2018 polypectomy site bx'd->benign.  +Hx of postpolypectomy bleeding-->colonoscopy was done 3d after 11/2018 polypectomy to fix this.   GERD (gastroesophageal reflux disease)    mild   Helicobacter pylori gastritis 02/2018   EGD+.  Stool antigen NEG after abx treatment.   Hepatic steatosis    Noted on MRI abd 12/2017 (done for RUQ pain). also noted on EUS.   Hypercholesterolemia 12/2017   Recommended statin 12/2017: pt chose [facility] and repeat chol 3 mo. Recommended statin 01/2020.   Kidney stone    Obesity, Class II, BMI 35-39.9    Primary hypogonadism in male    Managed by pt's urologist (including biannual DREs, Hbs and PSAs).   RUQ abdominal pain Dec 2018;Jan 2019   Labs nl, abd plain films nl, MRCP nl.  GI did EUS 2019, normal except chronic gastritis, + H pylori.  Stool antigen test NEG after abx tx.   Past Surgical History:  Procedure Laterality Date   CHOLECYSTECTOMY  2010   COLONOSCOPY     COLONOSCOPY N/A 11/25/2018   Adenomatous: recall 6 mo due to piecemeal resection of one of the polyps.  Procedure: COLONOSCOPY;  Surgeon: Meridee Score Netty Starring., MD;  Location: [facility];  Service: Gastroenterology;  Laterality: N/A;   COLONOSCOPY  11/28/2018   for post polypectomy bleeding.  Vessel cauterized/clipped   COLONOSCOPY  05/03/2019   f/u view of polypectomy site from 11/2018 was done due to piecemeal resection. Bx ->inflam/benign. Recall 3 yrs.   COLONOSCOPY W/ POLYPECTOMY   01/20/2009   Multiple nonadenomatous polyps.  Recall 10 yrs.   ESOPHAGOGASTRODUODENOSCOPY  02/14/2018   +EUS: Chronic active gastritis, + H PYLORI, tx'd with abx by GI, stool antigen test NEG after abx (04/2018)   EUS N/A 02/14/2018   Procedure: UPPER ENDOSCOPIC ULTRASOUND (EUS) LINEAR;  Surgeon: Rachael Fee, MD;  Location: [facility];  Service: Endoscopy;  Laterality: N/A;   HOT HEMOSTASIS N/A 11/25/2018   Procedure: HOT HEMOSTASIS (ARGON PLASMA COAGULATION/BICAP);  Surgeon: Lemar Lofty., MD;  Location: [facility];  Service: Gastroenterology;  Laterality: N/A;   KNEE SURGERY Left 2000 and 2017   arthroscopic   LUMBAR SPINE SURGERY  2000   No hardware   NASAL SEPTUM SURGERY  1981   POLYPECTOMY     SUBMUCOSAL INJECTION  11/25/2018   Procedure: SUBMUCOSAL INJECTION;  Surgeon: Lemar Lofty., MD;  Location: [facility];  Service: Gastroenterology;;   TONSILLECTOMY  1978   UPPER GASTROINTESTINAL ENDOSCOPY     Current Outpatient Medications  Medication Sig Dispense Refill   Apoaequorin (PREVAGEN PO) Take by mouth.     B-D 3CC LUER-LOK SYR 22GX1-1/2 22G X 1-1/2" 3 ML MISC USE AS DIRECTED FOR TESTOSTERONE INJECTIONS (Patient not taking: Reported on 02/08/2024)     carboxymethylcellulose (REFRESH PLUS) 0.5 % SOLN Place 1 drop into both eyes 3 (three) times daily as needed (dry eyes).     ciclopirox (PENLAC) 8 % solution Apply topically at bedtime. Apply over nail and surrounding  skin. Apply daily over previous coat. After seven (7) days, may remove with alcohol and continue cycle. 6.6 mL 7   clobetasol cream (TEMOVATE) 0.05 % Apply 1 application  topically daily as needed (dry skin).     Coenzyme Q10 (CO Q 10 PO) Take by mouth daily. 2 teaspoon     cyanocobalamin (VITAMIN B12) 1000 MCG tablet Take 1,000 mcg by mouth daily.     ferrous sulfate 324 MG TBEC Take 324 mg by mouth.     Garlic 2000 MG TBEC Take by mouth.     Misc Natural Products (PROSTATE HEALTH PO) Take 1  capsule by mouth daily.     Omega-3 Fatty Acids (FISH OIL) 1000 MG CAPS Take 1 capsule by mouth daily.      omeprazole (PRILOSEC) 40 MG capsule Take 1 capsule (40 mg total) by mouth 2 (two) times daily. 90 capsule 3   psyllium (METAMUCIL) 58.6 % powder Take 1 packet by mouth 3 (three) times daily.     Salicylic Acid 3 % OINT Apply 1 application  topically daily as needed (for remove dry, dead skin).     testosterone cypionate (DEPOTESTOSTERONE CYPIONATE) 200 MG/ML injection Inject 60 mg into the muscle every Thursday.   1   Turmeric (QC TUMERIC COMPLEX PO) Take by mouth.     Current Facility-Administered Medications  Medication Dose Route Frequency Provider Last Rate Last Admin   0.9 %  sodium chloride infusion  500 mL Intravenous Once Mansouraty, Netty Starring., MD       0.9 %  sodium chloride infusion  500 mL Intravenous Once Mansouraty, Netty Starring., MD        Current Outpatient Medications:    Apoaequorin (PREVAGEN PO), Take by mouth., Disp: , Rfl:    B-D 3CC LUER-LOK SYR 22GX1-1/2 22G X 1-1/2" 3 ML MISC, USE AS DIRECTED FOR TESTOSTERONE INJECTIONS (Patient not taking: Reported on 02/08/2024), Disp: , Rfl:    carboxymethylcellulose (REFRESH PLUS) 0.5 % SOLN, Place 1 drop into both eyes 3 (three) times daily as needed (dry eyes)., Disp: , Rfl:    ciclopirox (PENLAC) 8 % solution, Apply topically at bedtime. Apply over nail and surrounding skin. Apply daily over previous coat. After seven (7) days, may remove with alcohol and continue cycle., Disp: 6.6 mL, Rfl: 7   clobetasol cream (TEMOVATE) 0.05 %, Apply 1 application  topically daily as needed (dry skin)., Disp: , Rfl:    Coenzyme Q10 (CO Q 10 PO), Take by mouth daily. 2 teaspoon, Disp: , Rfl:    cyanocobalamin (VITAMIN B12) 1000 MCG tablet, Take 1,000 mcg by mouth daily., Disp: , Rfl:    ferrous sulfate 324 MG TBEC, Take 324 mg by mouth., Disp: , Rfl:    Garlic 2000 MG TBEC, Take by mouth., Disp: , Rfl:    Misc Natural Products (PROSTATE  HEALTH PO), Take 1 capsule by mouth daily., Disp: , Rfl:    Omega-3 Fatty Acids (FISH OIL) 1000 MG CAPS, Take 1 capsule by mouth daily. , Disp: , Rfl:    omeprazole (PRILOSEC) 40 MG capsule, Take 1 capsule (40 mg total) by mouth 2 (two) times daily., Disp: 90 capsule, Rfl: 3   psyllium (METAMUCIL) 58.6 % powder, Take 1 packet by mouth 3 (three) times daily., Disp: , Rfl:    Salicylic Acid 3 % OINT, Apply 1 application  topically daily as needed (for remove dry, dead skin)., Disp: , Rfl:    testosterone cypionate (DEPOTESTOSTERONE CYPIONATE) 200 MG/ML injection, Inject 60  mg into the muscle every Thursday. , Disp: , Rfl: 1   Turmeric (QC TUMERIC COMPLEX PO), Take by mouth., Disp: , Rfl:   Current Facility-Administered Medications:    0.9 %  sodium chloride infusion, 500 mL, Intravenous, Once, Mansouraty, Netty Starring., MD   0.9 %  sodium chloride infusion, 500 mL, Intravenous, Once, Mansouraty, Netty Starring., MD Allergies  Allergen Reactions   Levofloxacin Itching, Other (See Comments) and Hives    Whelps   Family History  Problem Relation Age of Onset   Arthritis Mother    Depression Mother    Alzheimer's disease Mother    Alcohol abuse Father    Arthritis Father    COPD Father    Hearing loss Father    Heart disease Father    Colon polyps Sister    Colon cancer Sister 46   Colon polyps Paternal Aunt    Colon cancer Paternal Aunt    Colon polyps Cousin    Colon cancer Cousin    Esophageal cancer Neg Hx    Rectal cancer Neg Hx    Stomach cancer Neg Hx    Social History   Socioeconomic History   Marital status: Married    Spouse name: Not on file   Number of children: Not on file   Years of education: Not on file   Highest education level: Not on file  Occupational History   Not on file  Tobacco Use   Smoking status: Former    Current packs/day: 0.00    Average packs/day: 1.5 packs/day for 3.0 years (4.5 ttl pk-yrs)    Types: Cigarettes    Start date: 12/11/1973    Quit  date: 12/11/1976    Years since quitting: 47.2   Smokeless tobacco: Never  Vaping Use   Vaping status: Never Used  Substance and Sexual Activity   Alcohol use: No   Drug use: No   Sexual activity: Not on file  Other Topics Concern   Not on file  Social History Narrative   Not on file   Social Drivers of Health   Financial Resource Strain: Not on file  Food Insecurity: Not on file  Transportation Needs: Not on file  Physical Activity: Not on file  Stress: Not on file  Social Connections: Not on file  Intimate Partner Violence: Not on file    Physical Exam: Today's Vitals   02/20/24 1238  BP: 134/83  Pulse: 89  Temp: 98.4 F (36.9 C)  TempSrc: Skin  SpO2: 95%  Weight: 250 lb (113.4 kg)  Height: 5\' 11"  (1.803 m)   Body mass index is 34.87 kg/m. GEN: NAD EYE: Sclerae anicteric ENT: MMM CV: Non-tachycardic GI: Soft, NT/ND NEURO:  Alert & Oriented x 3  Lab Results: No results for input(s): "WBC", "HGB", "HCT", "PLT" in the last 72 hours. BMET No results for input(s): "NA", "K", "CL", "CO2", "GLUCOSE", "BUN", "CREATININE", "CALCIUM" in the last 72 hours. LFT No results for input(s): "PROT", "ALBUMIN", "AST", "ALT", "ALKPHOS", "BILITOT", "BILIDIR", "IBILI" in the last 72 hours. PT/INR No results for input(s): "LABPROT", "INR" in the last 72 hours.   Impression / Plan: This is a 68 y.o.male who presents for EGD for followup of esophagitis and Barrett's esophagus findings with LGD in 2024.  The risks and benefits of endoscopic evaluation/treatment were discussed with the patient and/or family; these include but are not limited to the risk of perforation, infection, bleeding, missed lesions, lack of diagnosis, severe illness requiring hospitalization, as well as anesthesia  and sedation related illnesses.  The patient's history has been reviewed, patient examined, no change in status, and deemed stable for procedure.  The patient and/or family is agreeable to proceed.     Corliss Parish, MD [facility] Office # [phone]

## 2024-02-20 NOTE — Progress Notes (Signed)
 Sedate, gd SR, tolerated procedure well, VSS, report to RN

## 2024-02-20 NOTE — Progress Notes (Signed)
 Pt's states no medical or surgical changes since previsit or office visit.

## 2024-02-20 NOTE — Patient Instructions (Signed)
-   Resume previous diet - Continue present medications. - Await pathology results - Patient given educational handouts related to procedure.  YOU HAD AN ENDOSCOPIC PROCEDURE TODAY AT THE [facility]:   Refer to the procedure report that was given to you for any specific questions about what was found during the examination.  If the procedure report does not answer your questions, please call your gastroenterologist to clarify.  If you requested that your care partner not be given the details of your procedure findings, then the procedure report has been included in a sealed envelope for you to review at your convenience later.  YOU SHOULD EXPECT: Some feelings of bloating in the abdomen. Passage of more gas than usual.  Walking can help get rid of the air that was put into your GI tract during the procedure and reduce the bloating. If you had a lower endoscopy (such as a colonoscopy or flexible sigmoidoscopy) you may notice spotting of blood in your stool or on the toilet paper. If you underwent a bowel prep for your procedure, you may not have a normal bowel movement for a few days.  Please Note:  You might notice some irritation and congestion in your nose or some drainage.  This is from the oxygen used during your procedure.  There is no need for concern and it should clear up in a day or so.  SYMPTOMS TO REPORT IMMEDIATELY: Following upper endoscopy (EGD)  Vomiting of blood or coffee ground material  New chest pain or pain under the shoulder blades  Painful or persistently difficult swallowing  New shortness of breath  Fever of 100F or higher  Black, tarry-looking stools  For urgent or emergent issues, a gastroenterologist can be reached at any hour by calling [phone]. Do not use MyChart messaging for urgent concerns.    DIET:  We do recommend a small meal at first, but then you may proceed to your regular diet.  Drink plenty of fluids but you should avoid alcoholic  beverages for 24 hours.  ACTIVITY:  You should plan to take it easy for the rest of today and you should NOT DRIVE or use heavy machinery until tomorrow (because of the sedation medicines used during the test).    FOLLOW UP: Our staff will call the number listed on your records the next business day following your procedure.  We will call around 7:15- 8:00 am to check on you and address any questions or concerns that you may have regarding the information given to you following your procedure. If we do not reach you, we will leave a message.     If any biopsies were taken you will be contacted by phone or by letter within the next 1-3 weeks.  Please call us at [phone] if you have not heard about the biopsies in 3 weeks.    SIGNATURES/CONFIDENTIALITY: You and/or your care partner have signed paperwork which will be entered into your electronic medical record.  These signatures attest to the fact that that the information above on your After Visit Summary has been reviewed and is understood.  Full responsibility of the confidentiality of this discharge information lies with you and/or your care-partner.

## 2024-02-21 ENCOUNTER — Telehealth: Payer: Self-pay | Admitting: *Deleted

## 2024-02-21 NOTE — Telephone Encounter (Signed)
  Follow up Call-     02/20/2024   12:42 PM 06/01/2023    9:15 AM 09/01/2022    9:50 AM  Call back number  Post procedure Call Back phone  # [phone]  Permission to leave phone message Yes Yes Yes  No answer, left message to call back if needed

## 2024-02-25 ENCOUNTER — Encounter: Payer: Self-pay | Admitting: Gastroenterology

## 2024-02-25 ENCOUNTER — Encounter: Payer: Self-pay | Admitting: Family Medicine

## 2024-02-25 LAB — SURGICAL PATHOLOGY

## 2024-02-27 ENCOUNTER — Other Ambulatory Visit: Payer: Self-pay

## 2024-02-27 DIAGNOSIS — K22719 Barrett's esophagus with dysplasia, unspecified: Secondary | ICD-10-CM

## 2024-02-29 ENCOUNTER — Ambulatory Visit (INDEPENDENT_AMBULATORY_CARE_PROVIDER_SITE_OTHER): Payer: Medicare Other

## 2024-02-29 DIAGNOSIS — B351 Tinea unguium: Secondary | ICD-10-CM

## 2024-02-29 NOTE — Progress Notes (Signed)
 Patient presents today for the 4th laser treatment. Diagnosed with mycotic nail infection by Dr. Ralene Cork.   Toenail most affected bilateral 1st & 5th. Patient requested all 10 toes be done.   All other systems are negative.  Nails were filed thin. Laser therapy was administered to bilateral 1st & 5th toenails and patient tolerated the treatment well. All safety precautions were in place.   Single laser pass was performed on non-affected nails.   Follow up in 6 weeks for laser # 5.

## 2024-04-04 ENCOUNTER — Other Ambulatory Visit: Payer: Medicare Other

## 2024-04-07 ENCOUNTER — Other Ambulatory Visit

## 2024-04-08 ENCOUNTER — Ambulatory Visit (INDEPENDENT_AMBULATORY_CARE_PROVIDER_SITE_OTHER): Admitting: Podiatrist

## 2024-04-08 ENCOUNTER — Other Ambulatory Visit

## 2024-04-08 DIAGNOSIS — B351 Tinea unguium: Secondary | ICD-10-CM

## 2024-04-08 NOTE — Progress Notes (Signed)
 Patient presents today for the laser treatment # 5. Diagnosed with mycotic nail infection by Dr. Sikora.   he has been applying penlac  nail laquer at home- relates he is seeing some improvement.    Toenails affected are all 10-  with most involvement bilateral first and bilateral fifth nails.   All other systems are negative.  Patients nails x 10  were filed thin using a sterile burr.    Laser therapy via [facility] Laser therapy system was admininstered to affected toenails x 10 with particular attention paid to bilateral hallux nails and bilateral fifth toenails .  The Patient tolerated the treatment well.   All safety precautions were in place.    Follow up in 4-6  weeks for laser # 6

## 2024-04-14 ENCOUNTER — Encounter (HOSPITAL_COMMUNITY): Payer: Self-pay | Admitting: Gastroenterology

## 2024-04-14 ENCOUNTER — Telehealth: Payer: Self-pay

## 2024-04-14 NOTE — Telephone Encounter (Signed)
 Procedure:endo Procedure date: 04/21/24 Procedure location: [facility] Arrival Time: 745am Spoke with the patient Y/N:  left detail message @343  pm on 04/14/24 in regaurds to procedure Any prep concerns? .  Has the patient obtained the prep from the pharmacy ? Jordan Aas Do you have a care partner and transportation: . Any additional concerns? ,

## 2024-04-14 NOTE — Progress Notes (Signed)
 Attempted to obtain medical history for pre op call via telephone, unable to reach at this time. HIPAA compliant voicemail message left requesting return call to pre surgical testing department.

## 2024-04-16 NOTE — Telephone Encounter (Signed)
 PT is calling to confirm procedure for 5/12

## 2024-04-16 NOTE — Progress Notes (Signed)
 Pt- Jordan Berg   PCP- [facility]  Cardiologist-n/a  EKG-02/06/24 Echo-n/a Cath-n/a Stress-n/a ICD/PM-n/a Blood thinner-n/a GLP-1-n/a   Hx- GERD, Barretts Esophagus. Patient states he hasn't seen his pcp in a couple years. Says he doesn't have any current health issues, denies chest pain, SOB. I asked he went to the ED in 02/06/24 for chest pain, he said they thought it was reflux- nothing cardiac related. He reports no issues since, no more chest pains. No mobility issues, no equipment use.  Anesthesia Review- Yes- okay to proceed

## 2024-04-21 ENCOUNTER — Telehealth: Payer: Self-pay | Admitting: Gastroenterology

## 2024-04-21 ENCOUNTER — Encounter (HOSPITAL_COMMUNITY)
Admission: RE | Disposition: A | Discharge status: Home / Self Care | Payer: Self-pay | Source: Home / Self Care | Attending: Gastroenterology

## 2024-04-21 ENCOUNTER — Other Ambulatory Visit: Payer: Self-pay | Admitting: Gastroenterology

## 2024-04-21 ENCOUNTER — Other Ambulatory Visit: Payer: Self-pay

## 2024-04-21 ENCOUNTER — Ambulatory Visit (HOSPITAL_COMMUNITY): Payer: Self-pay | Admitting: Physician Assistant

## 2024-04-21 ENCOUNTER — Ambulatory Visit (HOSPITAL_COMMUNITY)
Admission: RE | Admit: 2024-04-21 | Discharge: 2024-04-21 | Disposition: A | Discharge status: Home / Self Care | Attending: Gastroenterology | Admitting: Gastroenterology

## 2024-04-21 ENCOUNTER — Encounter (HOSPITAL_COMMUNITY): Payer: Self-pay | Admitting: Gastroenterology

## 2024-04-21 ENCOUNTER — Ambulatory Visit (HOSPITAL_BASED_OUTPATIENT_CLINIC_OR_DEPARTMENT_OTHER): Payer: Self-pay | Admitting: Physician Assistant

## 2024-04-21 DIAGNOSIS — K760 Fatty (change of) liver, not elsewhere classified: Secondary | ICD-10-CM | POA: Insufficient documentation

## 2024-04-21 DIAGNOSIS — K22719 Barrett's esophagus with dysplasia, unspecified: Secondary | ICD-10-CM

## 2024-04-21 DIAGNOSIS — K22711 Barrett's esophagus with high grade dysplasia: Secondary | ICD-10-CM | POA: Diagnosis not present

## 2024-04-21 DIAGNOSIS — K229 Disease of esophagus, unspecified: Secondary | ICD-10-CM

## 2024-04-21 DIAGNOSIS — Z87891 Personal history of nicotine dependence: Secondary | ICD-10-CM | POA: Diagnosis not present

## 2024-04-21 DIAGNOSIS — K2289 Other specified disease of esophagus: Secondary | ICD-10-CM

## 2024-04-21 DIAGNOSIS — K449 Diaphragmatic hernia without obstruction or gangrene: Secondary | ICD-10-CM | POA: Diagnosis not present

## 2024-04-21 DIAGNOSIS — K209 Esophagitis, unspecified without bleeding: Secondary | ICD-10-CM

## 2024-04-21 DIAGNOSIS — K222 Esophageal obstruction: Secondary | ICD-10-CM

## 2024-04-21 DIAGNOSIS — K3189 Other diseases of stomach and duodenum: Secondary | ICD-10-CM

## 2024-04-21 DIAGNOSIS — R1011 Right upper quadrant pain: Secondary | ICD-10-CM

## 2024-04-21 HISTORY — PX: ESOPHAGOGASTRODUODENOSCOPY: SHX5428

## 2024-04-21 HISTORY — PX: ENDOSCOPIC MUCOSAL RESECTION: SHX6839

## 2024-04-21 SURGERY — EGD (ESOPHAGOGASTRODUODENOSCOPY)
Anesthesia: Monitor Anesthesia Care

## 2024-04-21 MED ORDER — LIDOCAINE 2% (20 MG/ML) 5 ML SYRINGE
INTRAMUSCULAR | Status: DC | PRN
  Administered 2024-04-21: 40 mg via INTRAVENOUS

## 2024-04-21 MED ORDER — ALUM & MAG HYDROXIDE-SIMETH 200-200-20 MG/5ML PO SUSP
30.0000 mL | Freq: Once | ORAL | Status: AC
  Administered 2024-04-21: 30 mL via ORAL
  Filled 2024-04-21: qty 30

## 2024-04-21 MED ORDER — PROPOFOL 500 MG/50ML IV EMUL
INTRAVENOUS | Status: AC
Start: 2024-04-21 — End: ?
  Filled 2024-04-21: qty 50

## 2024-04-21 MED ORDER — OMEPRAZOLE 40 MG PO CPDR: 40.0000 mg | DELAYED_RELEASE_CAPSULE | Freq: Two times a day (BID) | ORAL | 4 refills | Status: DC

## 2024-04-21 MED ORDER — OXYCODONE HCL 5 MG PO TABS: 5.0000 mg | ORAL_TABLET | Freq: Once | ORAL | Status: DC | PRN

## 2024-04-21 MED ORDER — FENTANYL CITRATE (PF) 100 MCG/2ML IJ SOLN: 25.0000 ug | INTRAMUSCULAR | Status: DC | PRN

## 2024-04-21 MED ORDER — ONDANSETRON HCL 4 MG/2ML IJ SOLN
INTRAMUSCULAR | Status: DC | PRN
  Administered 2024-04-21: 4 mg via INTRAVENOUS

## 2024-04-21 MED ORDER — MEPERIDINE HCL 50 MG/ML IJ SOLN: 6.2500 mg | INTRAMUSCULAR | Status: DC | PRN

## 2024-04-21 MED ORDER — PROPOFOL 10 MG/ML IV BOLUS
INTRAVENOUS | Status: DC | PRN
Start: 2024-04-21 — End: 2024-04-21
  Administered 2024-04-21: 40 mg via INTRAVENOUS
  Administered 2024-04-21: 70 mg via INTRAVENOUS
  Administered 2024-04-21 (×2): 30 mg via INTRAVENOUS
  Administered 2024-04-21: 50 mg via INTRAVENOUS
  Administered 2024-04-21 (×2): 40 mg via INTRAVENOUS
  Administered 2024-04-21: 30 mg via INTRAVENOUS
  Administered 2024-04-21: 90 mg via INTRAVENOUS
  Administered 2024-04-21: 10 mg via INTRAVENOUS
  Administered 2024-04-21: 100 mg via INTRAVENOUS

## 2024-04-21 MED ORDER — LIDOCAINE VISCOUS HCL 2 % MT SOLN: 30.0000 mL | Freq: Four times a day (QID) | OROMUCOSAL | 3 refills | Status: DC

## 2024-04-21 MED ORDER — DICYCLOMINE HCL 10 MG/5ML PO SOLN
10.0000 mg | Freq: Once | ORAL | Status: AC
  Administered 2024-04-21: 10 mg via ORAL
  Filled 2024-04-21: qty 5

## 2024-04-21 MED ORDER — FENTANYL CITRATE (PF) 100 MCG/2ML IJ SOLN
INTRAMUSCULAR | Status: DC | PRN
  Administered 2024-04-21: 50 ug via INTRAVENOUS

## 2024-04-21 MED ORDER — OXYCODONE HCL 5 MG/5ML PO SOLN: 5.0000 mg | Freq: Once | ORAL | Status: DC | PRN

## 2024-04-21 MED ORDER — ACETYLCYSTEINE 20 % IN SOLN
RESPIRATORY_TRACT | Status: AC
  Filled 2024-04-21: qty 4

## 2024-04-21 MED ORDER — FENTANYL CITRATE (PF) 100 MCG/2ML IJ SOLN
INTRAMUSCULAR | Status: AC
  Filled 2024-04-21: qty 2

## 2024-04-21 MED ORDER — ACETAMINOPHEN-CODEINE 120-12 MG/5ML PO SOLN: 10.0000 mL | Freq: Four times a day (QID) | ORAL | 0 refills | Status: DC | PRN

## 2024-04-21 MED ORDER — SUCRALFATE 1 G PO TABS: 1.0000 g | ORAL_TABLET | Freq: Four times a day (QID) | ORAL | 1 refills | Status: DC

## 2024-04-21 MED ORDER — SODIUM CHLORIDE 0.9 % IV SOLN: INTRAVENOUS | Status: DC

## 2024-04-21 MED ORDER — MIDAZOLAM HCL 2 MG/2ML IJ SOLN: 0.5000 mg | Freq: Once | INTRAMUSCULAR | Status: DC | PRN

## 2024-04-21 NOTE — Transfer of Care (Signed)
 Immediate Anesthesia Transfer of Care Note  Patient: Jordan Berg  Procedure(s) Performed: EGD (ESOPHAGOGASTRODUODENOSCOPY) RESECTION, MUCOSAL LESION, GI TRACT, ENDOSCOPIC  Patient Location: Endoscopy Unit  Anesthesia Type:MAC  Level of Consciousness: awake, alert , oriented, and patient cooperative  Airway & Oxygen Therapy: Patient Spontanous Breathing and Patient connected to face mask oxygen  Post-op Assessment: Report given to RN and Post -op Vital signs reviewed and stable  Post vital signs: Reviewed and stable  Last Vitals:  Vitals Value Taken Time  BP 100/75 04/21/24 0937  Temp 36.2 C 04/21/24 0937  Pulse 79 04/21/24 0939  Resp 9 04/21/24 0939  SpO2 91 % 04/21/24 0939  Vitals shown include unfiled device data.  Last Pain:  Vitals:   04/21/24 0937  TempSrc: Temporal  PainSc: 0-No pain         Complications: No notable events documented.

## 2024-04-21 NOTE — Telephone Encounter (Signed)
 Rx was faxed to [facility]

## 2024-04-21 NOTE — Telephone Encounter (Signed)
 Dr Brice Campi, please resend acetaminophen -codeine solution to the [facility] in [place]. ( I updated the pharmacy in the chart for you.)   I will resend GI cocktail to [facility]. Chief Operating Officer.)

## 2024-04-21 NOTE — Discharge Instructions (Signed)

## 2024-04-21 NOTE — H&P (Signed)
 GASTROENTEROLOGY PROCEDURE H&P NOTE   Primary Care Physician: Shelvia Dick, MD  HPI: Jordan Berg is a 68 y.o. male who presents for EGD for treatment of Barrett's HGD with ablation +/- EMR.  Past Medical History:  Diagnosis Date   Arthritis    mild   Cataract    forming   Colon polyps 01/20/2009; 11/24/18; 04/2019   2010: NON-adenomatous-recall 10 yrs.  11/2018->adenomatous. repeat 05/03/19 mo b/c of piecemeal resection of one of the polyps->small area at 11/2018 polypectomy site bx'd->benign.  +Hx of postpolypectomy bleeding-->colonoscopy was done 3d after 11/2018 polypectomy to fix this.   GERD (gastroesophageal reflux disease)    +Barrett's esoph 02/2024   Helicobacter pylori gastritis 02/2018   EGD+.  Stool antigen NEG after abx treatment.   Hepatic steatosis    Noted on MRI abd 12/2017 (done for RUQ pain). also noted on EUS.   Hypercholesterolemia 12/2017   Recommended statin 12/2017: pt chose [facility] and repeat chol 3 mo. Recommended statin 01/2020.   Kidney stone    Obesity, Class II, BMI 35-39.9    Primary hypogonadism in male    Managed by pt's urologist (including biannual DREs, Hbs and PSAs).   RUQ abdominal pain Dec 2018;Jan 2019   Labs nl, abd plain films nl, MRCP nl.  GI did EUS 2019, normal except chronic gastritis, + H pylori.  Stool antigen test NEG after abx tx.   Past Surgical History:  Procedure Laterality Date   CHOLECYSTECTOMY  2010   COLONOSCOPY     COLONOSCOPY N/A 11/25/2018   Adenomatous: recall 6 mo due to piecemeal resection of one of the polyps.  Procedure: COLONOSCOPY;  Surgeon: Brice Campi Albino Alu., MD;  Location: [facility];  Service: Gastroenterology;  Laterality: N/A;   COLONOSCOPY  11/28/2018   for post polypectomy bleeding.  Vessel cauterized/clipped   COLONOSCOPY  05/03/2019   f/u view of polypectomy site from 11/2018 was done due to piecemeal resection. Bx ->inflam/benign. Recall 3 yrs.   COLONOSCOPY W/ POLYPECTOMY  01/20/2009    Multiple nonadenomatous polyps.  Recall 10 yrs.   ESOPHAGOGASTRODUODENOSCOPY  02/14/2018   +EUS: Chronic active gastritis, + H PYLORI, tx'd with abx by GI, stool antigen test NEG after abx (04/2018)   ESOPHAGOGASTRODUODENOSCOPY     02/2024, Barrett's esoph with focal high grade dysplasia-->plan for ablation   EUS N/A 02/14/2018   Procedure: UPPER ENDOSCOPIC ULTRASOUND (EUS) LINEAR;  Surgeon: Janel Medford, MD;  Location: [facility];  Service: Endoscopy;  Laterality: N/A;   HOT HEMOSTASIS N/A 11/25/2018   Procedure: HOT HEMOSTASIS (ARGON PLASMA COAGULATION/BICAP);  Surgeon: Normie Becton., MD;  Location: [facility];  Service: Gastroenterology;  Laterality: N/A;   KNEE SURGERY Left 2000 and 2017   arthroscopic   LUMBAR SPINE SURGERY  2000   No hardware   NASAL SEPTUM SURGERY  1981   POLYPECTOMY     SUBMUCOSAL INJECTION  11/25/2018   Procedure: SUBMUCOSAL INJECTION;  Surgeon: Normie Becton., MD;  Location: [facility];  Service: Gastroenterology;;   TONSILLECTOMY  1978   Current Facility-Administered Medications  Medication Dose Route Frequency Provider Last Rate Last Admin   0.9 %  sodium chloride  infusion   Intravenous Continuous Mansouraty, Sheniah Supak Jr., MD        Current Facility-Administered Medications:    0.9 %  sodium chloride  infusion, , Intravenous, Continuous, Mansouraty, Albino Alu., MD Allergies  Allergen Reactions   Levofloxacin Itching, Other (See Comments) and Hives    Whelps   Family History  Problem Relation Age of Onset   Arthritis Mother    Depression Mother    Alzheimer's disease Mother    Alcohol abuse Father    Arthritis Father    COPD Father    Hearing loss Father    Heart disease Father    Colon polyps Sister    Colon cancer Sister 86   Colon polyps Paternal Aunt    Colon cancer Paternal Aunt    Colon polyps Cousin    Colon cancer Cousin    Esophageal cancer Neg Hx    Rectal cancer Neg Hx    Stomach cancer Neg Hx     Social History   Socioeconomic History   Marital status: Married    Spouse name: Not on file   Number of children: Not on file   Years of education: Not on file   Highest education level: Not on file  Occupational History   Not on file  Tobacco Use   Smoking status: Former    Current packs/day: 0.00    Average packs/day: 1.5 packs/day for 3.0 years (4.5 ttl pk-yrs)    Types: Cigarettes    Start date: 12/11/1973    Quit date: 12/11/1976    Years since quitting: 47.3   Smokeless tobacco: Never  Vaping Use   Vaping status: Never Used  Substance and Sexual Activity   Alcohol use: No   Drug use: No   Sexual activity: Not on file  Other Topics Concern   Not on file  Social History Narrative   Not on file   Social Drivers of Health   Financial Resource Strain: Not on file  Food Insecurity: Not on file  Transportation Needs: Not on file  Physical Activity: Not on file  Stress: Not on file  Social Connections: Not on file  Intimate Partner Violence: Not on file    Physical Exam: Today's Vitals   04/16/24 1123 04/21/24 0818  BP:  131/77  Resp:  13  Temp:  98 F (36.7 C)  TempSrc:  Temporal  SpO2:  95%  Weight: 110.7 kg 110.7 kg  Height:  5\' 11"  (1.803 m)  PainSc:  0-No pain   Body mass index is 34.03 kg/m. GEN: NAD EYE: Sclerae anicteric ENT: MMM CV: Non-tachycardic GI: Soft, NT/ND NEURO:  Alert & Oriented x 3  Lab Results: No results for input(s): "WBC", "HGB", "HCT", "PLT" in the last 72 hours. BMET No results for input(s): "NA", "K", "CL", "CO2", "GLUCOSE", "BUN", "CREATININE", "CALCIUM" in the last 72 hours. LFT No results for input(s): "PROT", "ALBUMIN", "AST", "ALT", "ALKPHOS", "BILITOT", "BILIDIR", "IBILI" in the last 72 hours. PT/INR No results for input(s): "LABPROT", "INR" in the last 72 hours.   Impression / Plan: This is a 68 y.o.male who presents for EGD for treatment of Barrett's HGD with ablation +/- EMR.  Based upon the description and  endoscopic pictures I do feel that it is reasonable to pursue an Advanced Polypectomy attempt of the polyp/lesion.  We discussed some of the techniques of advanced polypectomy which include Endoscopic Mucosal Resection, OVESCO Full-Thickness Resection, Endorotor Morcellation, and Tissue Ablation via Fulguration.  We also reviewed images of typical techniques as noted above.  The risks and benefits of endoscopic evaluation were discussed with the patient; these include but are not limited to the risk of perforation, infection, bleeding, missed lesions, lack of diagnosis, severe illness requiring hospitalization, as well as anesthesia and sedation related illnesses.  During attempts at advanced resection, the risks of bleeding and  perforation/leak are increased as opposed to diagnostic and screening procedures, and that was discussed with the patient as well.   In addition, I explained that with the possible need for piecemeal resection, subsequent short-interval endoscopic evaluation for follow up and potential retreatment of the lesion/area may be necessary.  All patient questions were answered, to the best of my ability, and the patient agrees to the aforementioned plan of action with follow-up as indicated.   The risks and benefits of endoscopic evaluation/treatment were discussed with the patient and/or family; these include but are not limited to the risk of perforation, infection, bleeding, missed lesions, lack of diagnosis, severe illness requiring hospitalization, as well as anesthesia and sedation related illnesses.  The patient's history has been reviewed, patient examined, no change in status, and deemed stable for procedure.  The patient and/or family is agreeable to proceed.    Yong Henle, MD [facility] Advanced Endoscopy Office # [phone]

## 2024-04-21 NOTE — Anesthesia Procedure Notes (Signed)
 Procedure Name: MAC Date/Time: 04/21/2024 8:50 AM  Performed by: Mervyn Ace, CRNAPre-anesthesia Checklist: Patient identified, Emergency Drugs available, Suction available, Patient being monitored and Timeout performed Patient Re-evaluated:Patient Re-evaluated prior to induction Oxygen Delivery Method: Non-rebreather mask and Simple face mask Preoxygenation: Pre-oxygenation with 100% oxygen (POM mask) Induction Type: IV induction Dental Injury: Teeth and Oropharynx as per pre-operative assessment

## 2024-04-21 NOTE — Op Note (Signed)
 [facility] Patient Name: Jordan Berg Procedure Date: 04/21/2024 MRN: [id number] Attending MD: Yong Henle , MD, [id number] Date of Birth: [birth year] CSN: [id number] Age: 68 Admit Type: Ambulatory Procedure:                Upper GI endoscopy Indications:              Barrett's esophagus, For endoscopic therapy of                            Barrett's esophagus with high grade dysplasia Providers:                Yong Henle, MD, Bradley Caffey, Gabino Joe, Technician Referring MD:              Medicines:                Monitored Anesthesia Care Complications:            No immediate complications. Estimated Blood Loss:     Estimated blood loss was minimal. Procedure:                Pre-Anesthesia Assessment:                           - Prior to the procedure, a History and Physical                            was performed, and patient medications and                            allergies were reviewed. The patient's tolerance of                            previous anesthesia was also reviewed. The risks                            and benefits of the procedure and the sedation                            options and risks were discussed with the patient.                            All questions were answered, and informed consent                            was obtained. Prior Anticoagulants: The patient has                            taken no anticoagulant or antiplatelet agents. ASA                            Grade Assessment: III - A patient with severe  systemic disease. After reviewing the risks and                            benefits, the patient was deemed in satisfactory                            condition to undergo the procedure.                           After obtaining informed consent, the endoscope was                            passed under direct vision. Throughout the                             procedure, the patient's blood pressure, pulse, and                            oxygen saturations were monitored continuously. The                            [id number]-[id number] ([id number]) Olympus endoscope was introduced                            through the mouth, and advanced to the second part                            of duodenum. The [id number]-[id number] ([id number]) Olympus                            therapeutic endoscope was introduced through the                            mouth, and advanced to the second part of duodenum.                            The upper GI endoscopy was accomplished without                            difficulty. The patient tolerated the procedure. Scope In: Scope Out: Findings:      No gross lesions were noted in the proximal esophagus and in the mid       esophagus.      The esophagus and gastroesophageal junction were examined with white       light and narrow band imaging (NBI) from a forward view and retroflexed       position. There were esophageal mucosal changes consistent with       short-segment Barrett's esophagus. As on last endoscopy report the       esophagitis had healed. These changes involved the irregular Z-line (40       cm from the incisors). Looking at the area more closely today, there was       evidence of nodularity with mild erosion/ulceration (this had not been       appreciated at last endoscopy) this was present  at 39 cm. The maximum       longitudinal extent of these esophageal mucosal changes was 1 cm in       length. As a result of the nodularity with erosion/ulceration, I did not       feel that he was an appropriate candidate for RFA today thus decision       was made for EMR of the Barrett's esophagus. Preparations were made for       mucosal resection. Demarcation of the lesion was performed with       high-definition white light and narrow band imaging to clearly identify       the boundaries of the lesion. Using the EMR Captivator  kit, a single       band ligator was placed over the abnormal tissue after suction and snare       mucosal resection was performed. Resection and retrieval were complete.       Resected tissue margins were examined and clear of polyp tissue. I did       not see any evidence of a defect of the muscle layer. Due to the amount       of tissue that was removed from that area this traversed into the       proximal gastric cardia. No evidence of any bleeding was noted. However       to decrease the risk of bleeding and maintain hemostasis, the area was       successfully injected with 3 mL PuraStat.      A non-obstructing Schatzki ring was found at the gastroesophageal       junction.      A 2 cm hiatal hernia was present.      Patchy mildly erythematous mucosa without bleeding was found in the       entire examined stomach.      No gross lesions were noted in the duodenal bulb, in the first portion       of the duodenum and in the second portion of the duodenum. Impression:               - No gross lesions in the proximal esophagus and in                            the mid esophagus.                           - Esophageal mucosal changes consistent with                            short-segment Barrett's esophagus. As noted above,                            the area was nodular with erosion/ulceration and                            not appropriate for RFA without first resection of                            the area. EMR CAP Band ligation performed. Snare  mucosal resection performed. PuraStat injected.                           - Non-obstructing Schatzki ring.                           - 2 cm hiatal hernia.                           - Erythematous mucosa in the stomach.                           - No gross lesions in the duodenal bulb, in the                            first portion of the duodenum and in the second                            portion of the  duodenum. Moderate Sedation:      Not Applicable - Patient had care per Anesthesia. Recommendation:           - The patient will be observed post-procedure,                            until all discharge criteria are met.                           - Discharge patient to home.                           - Patient has a contact number available for                            emergencies. The signs and symptoms of potential                            delayed complications were discussed with the                            patient. Return to normal activities tomorrow.                            Written discharge instructions were provided to the                            patient.                           - Clear liquid diet for 24 hours. Then, full liquid                            diet for 48 hours. Then, soft diet for 1 week                            thereafter. Then advance your diet  thereafter to                            regular.                           - Continue your PPI 40 mg twice daily.                           - You will be prescribed a lidocaine/antiacid                            mixture that you can take up to 4 times per day as                            needed (prescription has been sent to your                            pharmacy) - take no matter what for at least first                            3-days.                           - You will be prescribed liquid Tylenol  with                            codeine that you can use up to 4 times daily for                            the next 72 hours if needed (prescription to be                            sent to your pharmacy) - I recommend using this                            even if you do not have pain for the first 2-3 days.                           - You will be prescribed Sucralfate  Suspension                            (sending in tablet form as it is less expensive you                            will need to  crush it and dissolve in 15-20 mL of                            fluid) and you will take this 4 times daily (make                            sure you are not taking any other medication 1  hour                            before or 1 hour after this medication is taken) to                            allow coating and healing of your esophagus - this                            needs to be taken for at least 58-month (you should                            have refills available but call if you run out).                           - Observe patient's clinical course.                           - Continue present medications otherwise.                           - Await pathology results.                           - Repeat upper endoscopy in 4 months per Barrett's                            esophagus dysplasia protocol. At that point, as                            long as no evidence of nodularity, we will begin                            RFA ablations.                           - The findings and recommendations were discussed                            with the patient.                           - The findings and recommendations were discussed                            with the patient's family. Procedure Code(s):        --- Professional ---                           [phone], Esophagogastroduodenoscopy, flexible,                            transoral; with endoscopic mucosal resection                           [id number],  59, Esophagogastroduodenoscopy, flexible,                            transoral; with control of bleeding, any method Diagnosis Code(s):        --- Professional ---                           K22.2, Esophageal obstruction                           K44.9, Diaphragmatic hernia without obstruction or                            gangrene                           K31.89, Other diseases of stomach and duodenum                           K22.711, Barrett's esophagus with high grade                             dysplasia CPT copyright 2022 American Medical Association. All rights reserved. The codes documented in this report are preliminary and upon coder review may  be revised to meet current compliance requirements. Yong Henle, MD 04/21/2024 9:41:34 AM Number of Addenda: 0

## 2024-04-21 NOTE — Anesthesia Postprocedure Evaluation (Signed)
 Anesthesia Post Note  Patient: Jordan Berg  Procedure(s) Performed: EGD (ESOPHAGOGASTRODUODENOSCOPY) RESECTION, MUCOSAL LESION, GI TRACT, ENDOSCOPIC     Patient location during evaluation: Endoscopy Anesthesia Type: MAC Level of consciousness: awake and alert, patient cooperative and oriented Pain management: pain level controlled Vital Signs Assessment: post-procedure vital signs reviewed and stable Respiratory status: spontaneous breathing, nonlabored ventilation and respiratory function stable Cardiovascular status: stable and blood pressure returned to baseline Postop Assessment: no apparent nausea or vomiting and able to ambulate Anesthetic complications: no   No notable events documented.  Last Vitals:  Vitals:   04/21/24 1000 04/21/24 1010  BP: (!) 135/92 110/84  Pulse: 67 63  Resp: 14 18  Temp:    SpO2: 94% 96%    Last Pain:  Vitals:   04/21/24 1016  TempSrc:   PainSc: 0-No pain                 Britini Garcilazo,E. Voyd Groft

## 2024-04-21 NOTE — Anesthesia Preprocedure Evaluation (Addendum)
 Anesthesia Evaluation  Patient identified by MRN, date of birth, ID band Patient awake    Reviewed: Allergy & Precautions, NPO status , Patient's Chart, lab work & pertinent test results  History of Anesthesia Complications Negative for: history of anesthetic complications  Airway Mallampati: I  TM Distance: >3 FB Neck ROM: Full    Dental  (+) Caps, Dental Advisory Given   Pulmonary former smoker   breath sounds clear to auscultation       Cardiovascular negative cardio ROS  Rhythm:Regular Rate:Normal     Neuro/Psych negative neurological ROS     GI/Hepatic Neg liver ROS,GERD  Medicated and Controlled,,  Endo/Other  BMI 34  Renal/GU H/o stones     Musculoskeletal  (+) Arthritis ,    Abdominal   Peds  Hematology negative hematology ROS (+)   Anesthesia Other Findings   Reproductive/Obstetrics                              Anesthesia Physical Anesthesia Plan  ASA: 2  Anesthesia Plan: MAC   Post-op Pain Management: Minimal or no pain anticipated   Induction:   PONV Risk Score and Plan: Treatment may vary due to age or medical condition  Airway Management Planned: Natural Airway and Simple Face Mask  Additional Equipment: None  Intra-op Plan:   Post-operative Plan:   Informed Consent: I have reviewed the patients History and Physical, chart, labs and discussed the procedure including the risks, benefits and alternatives for the proposed anesthesia with the patient or authorized representative who has indicated his/her understanding and acceptance.     Dental advisory given  Plan Discussed with: CRNA and Surgeon  Anesthesia Plan Comments:          Anesthesia Quick Evaluation

## 2024-04-21 NOTE — Telephone Encounter (Signed)
 Inbound call from [facility] in [facility] in regards to medications called into their pharmacy.   State the pain medication needs to [facility] [place] because since it's a controlled substance they cannot do it on their end.  [facility] [place] states they also cannot compound medication state mouth wash needs to be sent to [facility] pharmacy on [address]

## 2024-04-22 ENCOUNTER — Encounter (HOSPITAL_COMMUNITY): Payer: Self-pay | Admitting: Gastroenterology

## 2024-04-23 ENCOUNTER — Encounter: Payer: Self-pay | Admitting: Family Medicine

## 2024-04-23 LAB — SURGICAL PATHOLOGY

## 2024-04-26 ENCOUNTER — Encounter (HOSPITAL_COMMUNITY): Payer: Self-pay

## 2024-04-26 ENCOUNTER — Inpatient Hospital Stay (HOSPITAL_COMMUNITY)
Admission: EM | Admit: 2024-04-26 | Discharge: 2024-04-29 | DRG: 919 | Disposition: A | Discharge status: Home / Self Care | Attending: Family Medicine | Admitting: Family Medicine

## 2024-04-26 ENCOUNTER — Other Ambulatory Visit: Payer: Self-pay

## 2024-04-26 ENCOUNTER — Telehealth: Payer: Self-pay | Admitting: Pediatrics

## 2024-04-26 DIAGNOSIS — K9189 Other postprocedural complications and disorders of digestive system: Secondary | ICD-10-CM | POA: Diagnosis not present

## 2024-04-26 DIAGNOSIS — K22711 Barrett's esophagus with high grade dysplasia: Secondary | ICD-10-CM | POA: Diagnosis not present

## 2024-04-26 DIAGNOSIS — K219 Gastro-esophageal reflux disease without esophagitis: Secondary | ICD-10-CM | POA: Diagnosis not present

## 2024-04-26 DIAGNOSIS — E7800 Pure hypercholesterolemia, unspecified: Secondary | ICD-10-CM | POA: Diagnosis present

## 2024-04-26 DIAGNOSIS — K2211 Ulcer of esophagus with bleeding: Secondary | ICD-10-CM | POA: Diagnosis not present

## 2024-04-26 DIAGNOSIS — Z87891 Personal history of nicotine dependence: Secondary | ICD-10-CM

## 2024-04-26 DIAGNOSIS — Z6833 Body mass index (BMI) 33.0-33.9, adult: Secondary | ICD-10-CM

## 2024-04-26 DIAGNOSIS — K91840 Postprocedural hemorrhage and hematoma of a digestive system organ or structure following a digestive system procedure: Secondary | ICD-10-CM | POA: Diagnosis not present

## 2024-04-26 DIAGNOSIS — D62 Acute posthemorrhagic anemia: Secondary | ICD-10-CM | POA: Diagnosis not present

## 2024-04-26 DIAGNOSIS — K921 Melena: Secondary | ICD-10-CM | POA: Diagnosis not present

## 2024-04-26 DIAGNOSIS — Y838 Other surgical procedures as the cause of abnormal reaction of the patient, or of later complication, without mention of misadventure at the time of the procedure: Secondary | ICD-10-CM | POA: Diagnosis present

## 2024-04-26 DIAGNOSIS — E66811 Obesity, class 1: Secondary | ICD-10-CM | POA: Diagnosis present

## 2024-04-26 DIAGNOSIS — K2270 Barrett's esophagus without dysplasia: Secondary | ICD-10-CM | POA: Diagnosis not present

## 2024-04-26 DIAGNOSIS — K922 Gastrointestinal hemorrhage, unspecified: Secondary | ICD-10-CM | POA: Diagnosis not present

## 2024-04-26 LAB — COMPREHENSIVE METABOLIC PANEL WITH GFR
ALT: 36 U/L (ref 0–44)
AST: 25 U/L (ref 15–41)
Albumin: 3.6 g/dL (ref 3.5–5.0)
Alkaline Phosphatase: 42 U/L (ref 38–126)
Anion gap: 9 (ref 5–15)
BUN: 50 mg/dL — ABNORMAL HIGH (ref 8–23)
CO2: 23 mmol/L (ref 22–32)
Calcium: 8.9 mg/dL (ref 8.9–10.3)
Chloride: 100 mmol/L (ref 98–111)
Creatinine, Ser: 1.16 mg/dL (ref 0.61–1.24)
GFR, Estimated: >60 mL/min (ref >60)
Glucose, Bld: 135 mg/dL — ABNORMAL HIGH (ref 70–99)
Potassium: 3.9 mmol/L (ref 3.5–5.1)
Sodium: 132 mmol/L — ABNORMAL LOW (ref 135–145)
Total Bilirubin: 0.8 mg/dL (ref 0.0–1.2)
Total Protein: 6.5 g/dL (ref 6.5–8.1)

## 2024-04-26 LAB — CBC
HCT: 44.6 % (ref 39.0–52.0)
Hemoglobin: 14.6 g/dL (ref 13.0–17.0)
MCH: 31.8 pg (ref 26.0–34.0)
MCHC: 32.7 g/dL (ref 30.0–36.0)
MCV: 97.2 fL (ref 80.0–100.0)
Platelets: 239 10*3/uL (ref 150–400)
RBC: 4.59 MIL/uL (ref 4.22–5.81)
RDW: 13 % (ref 11.5–15.5)
WBC: 12.7 10*3/uL — ABNORMAL HIGH (ref 4.0–10.5)
nRBC: 0 % (ref 0.0–0.2)

## 2024-04-26 LAB — TYPE AND SCREEN
ABO/RH(D): A POS
Antibody Screen: NEGATIVE

## 2024-04-26 LAB — POC OCCULT BLOOD, ED: Fecal Occult Bld: POSITIVE — AB

## 2024-04-26 MED ORDER — PANTOPRAZOLE SODIUM 40 MG IV SOLR
80.0000 mg | Freq: Once | INTRAVENOUS | Status: AC
Start: 2024-04-26 — End: 2024-04-26
  Administered 2024-04-26: 80 mg via INTRAVENOUS
  Filled 2024-04-26: qty 20

## 2024-04-26 MED ORDER — ONDANSETRON HCL 4 MG/2ML IJ SOLN
4.0000 mg | Freq: Four times a day (QID) | INTRAMUSCULAR | Status: DC | PRN
Start: 2024-04-26 — End: 2024-04-29
  Administered 2024-04-28: 4 mg via INTRAVENOUS

## 2024-04-26 MED ORDER — SODIUM CHLORIDE 0.9 % IV BOLUS
1000.0000 mL | Freq: Once | INTRAVENOUS | Status: AC
  Administered 2024-04-26: 1000 mL via INTRAVENOUS

## 2024-04-26 MED ORDER — SENNOSIDES-DOCUSATE SODIUM 8.6-50 MG PO TABS: 1.0000 | ORAL_TABLET | Freq: Every evening | ORAL | Status: DC | PRN

## 2024-04-26 MED ORDER — MELATONIN 3 MG PO TABS
3.0000 mg | ORAL_TABLET | Freq: Every day | ORAL | Status: DC
  Administered 2024-04-26 – 2024-04-28 (×3): 3 mg via ORAL
  Filled 2024-04-26 (×3): qty 1

## 2024-04-26 MED ORDER — ONDANSETRON HCL 4 MG PO TABS: 4.0000 mg | ORAL_TABLET | Freq: Four times a day (QID) | ORAL | Status: DC | PRN

## 2024-04-26 MED ORDER — ACETAMINOPHEN 325 MG PO TABS: 650.0000 mg | ORAL_TABLET | Freq: Four times a day (QID) | ORAL | Status: DC | PRN

## 2024-04-26 MED ORDER — OMEGA-3-ACID ETHYL ESTERS 1 G PO CAPS
1.0000 g | ORAL_CAPSULE | Freq: Every day | ORAL | Status: DC
  Administered 2024-04-27: 1 g via ORAL
  Filled 2024-04-26: qty 1

## 2024-04-26 MED ORDER — SUCRALFATE 1 G PO TABS
1.0000 g | ORAL_TABLET | Freq: Four times a day (QID) | ORAL | Status: DC
  Administered 2024-04-26 – 2024-04-27 (×2): 1 g via ORAL
  Filled 2024-04-26 (×2): qty 1

## 2024-04-26 MED ORDER — APOAEQUORIN 10 MG PO CAPS: ORAL_CAPSULE | Freq: Every morning | ORAL | Status: DC

## 2024-04-26 MED ORDER — PANTOPRAZOLE SODIUM 40 MG IV SOLR
40.0000 mg | Freq: Two times a day (BID) | INTRAVENOUS | Status: DC
  Administered 2024-04-27 – 2024-04-28 (×3): 40 mg via INTRAVENOUS
  Filled 2024-04-26 (×3): qty 10

## 2024-04-26 MED ORDER — ACETAMINOPHEN 650 MG RE SUPP: 650.0000 mg | Freq: Four times a day (QID) | RECTAL | Status: DC | PRN

## 2024-04-26 NOTE — Progress Notes (Signed)
 PHARMACIST - PHYSICIAN ORDER COMMUNICATION  CONCERNING: P&T Medication Policy on Herbal Medications  DESCRIPTION:  This patient's order for:  prevagen  has been noted.  This product(s) is classified as an "herbal" or natural product. Due to a lack of definitive safety studies or FDA approval, nonstandard manufacturing practices, plus the potential risk of unknown drug-drug interactions while on inpatient medications, the Pharmacy and Therapeutics Committee does not permit the use of "herbal" or natural products of this type within [facility].   ACTION TAKEN: The pharmacy department is unable to verify this order at this time and your patient has been informed of this safety policy. Please reevaluate patient's clinical condition at discharge and address if the herbal or natural product(s) should be resumed at that time.   Beau Bound RPh 04/26/2024, 10:40 PM

## 2024-04-26 NOTE — ED Triage Notes (Signed)
 Pt presents with a hx of Barrett's esophagus and he had an endoscopy and and esophageal nodule removed on 5/12. Today pt noted dark, tarry stool and fatigue. His gastroenterologist referred him to the ED.

## 2024-04-26 NOTE — H&P (Signed)
 History and Physical  Jordan Berg NGE:[id number] DOB: 1956-07-03 DOA: 04/26/2024  PCP: Shelvia Dick, MD   Chief Complaint: Melena, fatigue  HPI: Jordan Berg is a 68 y.o. male with medical history significant for GERD, Barrett's esophagus, gastritis, HLD, and obesity who presented to the ED for evaluation of melena and fatigue. Patient had EGD on 04/21/2024 with endoscopic mucosal resection of Barrett's esophagus and did well a few days after the procedure. This morning he woke up feeling fatigued and "queasy" . Throughout the day, he had about 7-8 bowel movements described as dark tarry stools. He reports feeling slightly lightheaded while in the ER but denies any abdominal pain, hematochezia, hematemesis, vomiting, hematuria or weakness.  He called the on-call GI provider and was advised to present to the ED for further evaluation.  ED Course: Initial vitals show patient tachycardic with HR in the 100s but otherwise normotensive and stable. Labs significant for WBC 12.7, Hgb 14.6 (from 17.3 4 months ago), platelet 239, sodium 132, K+ 3.9, BUN/creatinine 50/1.16, positive fecal occult test. GI was consulted for evaluation. Recommended IV Protonix and n.p.o. at midnight. Patient received IV Protonix 80 mg x 1. TRH consulted for admission.  Review of Systems: Please see HPI for pertinent positives and negatives. A complete 10 system review of systems are otherwise negative.  Past Medical History:  Diagnosis Date   Arthritis    mild   Cataract    forming   Colon polyps 01/20/2009; 11/24/18; 04/2019   2010: NON-adenomatous-recall 10 yrs.  11/2018->adenomatous. repeat 05/03/19 mo b/c of piecemeal resection of one of the polyps->small area at 11/2018 polypectomy site bx'd->benign.  +Hx of postpolypectomy bleeding-->colonoscopy was done 3d after 11/2018 polypectomy to fix this.   GERD (gastroesophageal reflux disease)    +Barrett's esoph 02/2024   Helicobacter pylori gastritis 02/2018    EGD+.  Stool antigen NEG after abx treatment.   Hepatic steatosis    Noted on MRI abd 12/2017 (done for RUQ pain). also noted on EUS.   Hypercholesterolemia 12/2017   Recommended statin 12/2017: pt chose [facility] and repeat chol 3 mo. Recommended statin 01/2020.   Kidney stone    Obesity, Class II, BMI 35-39.9    Primary hypogonadism in male    Managed by pt's urologist (including biannual DREs, Hbs and PSAs).   RUQ abdominal pain Dec 2018;Jan 2019   Labs nl, abd plain films nl, MRCP nl.  GI did EUS 2019, normal except chronic gastritis, + H pylori.  Stool antigen test NEG after abx tx.   Past Surgical History:  Procedure Laterality Date   CHOLECYSTECTOMY  2010   COLONOSCOPY     COLONOSCOPY N/A 11/25/2018   Adenomatous: recall 6 mo due to piecemeal resection of one of the polyps.  Procedure: COLONOSCOPY;  Surgeon: Brice Campi Albino Alu., MD;  Location: [facility];  Service: Gastroenterology;  Laterality: N/A;   COLONOSCOPY  11/28/2018   for post polypectomy bleeding.  Vessel cauterized/clipped   COLONOSCOPY  05/03/2019   f/u view of polypectomy site from 11/2018 was done due to piecemeal resection. Bx ->inflam/benign. Recall 3 yrs.   COLONOSCOPY W/ POLYPECTOMY  01/20/2009   Multiple nonadenomatous polyps.  Recall 10 yrs.   ENDOSCOPIC MUCOSAL RESECTION N/A 04/21/2024   Procedure: RESECTION, MUCOSAL LESION, GI TRACT, ENDOSCOPIC;  Surgeon: Brice Campi, Albino Alu., MD;  Location: [facility];  Service: Gastroenterology;  Laterality: N/A;   ESOPHAGOGASTRODUODENOSCOPY  02/14/2018   +EUS: Chronic active gastritis, + H PYLORI, tx'd with abx by GI, stool  antigen test NEG after abx (04/2018)   ESOPHAGOGASTRODUODENOSCOPY     02/2024, Barrett's esoph with focal high grade dysplasia-->plan for ablation   ESOPHAGOGASTRODUODENOSCOPY N/A 04/21/2024   Some low grade and high grade dysplasia on path. Procedure: EGD (ESOPHAGOGASTRODUODENOSCOPY);  Surgeon: Normie Becton., MD;  Location: [facility];  Service: Gastroenterology;  Laterality: N/A;   EUS N/A 02/14/2018   Procedure: UPPER ENDOSCOPIC ULTRASOUND (EUS) LINEAR;  Surgeon: Janel Medford, MD;  Location: [facility];  Service: Endoscopy;  Laterality: N/A;   HOT HEMOSTASIS N/A 11/25/2018   Procedure: HOT HEMOSTASIS (ARGON PLASMA COAGULATION/BICAP);  Surgeon: Normie Becton., MD;  Location: [facility];  Service: Gastroenterology;  Laterality: N/A;   KNEE SURGERY Left 2000 and 2017   arthroscopic   LUMBAR SPINE SURGERY  2000   No hardware   NASAL SEPTUM SURGERY  1981   POLYPECTOMY     SUBMUCOSAL INJECTION  11/25/2018   Procedure: SUBMUCOSAL INJECTION;  Surgeon: Normie Becton., MD;  Location: [facility];  Service: Gastroenterology;;   TONSILLECTOMY  1978   Social History:  reports that he quit smoking about 47 years ago. His smoking use included cigarettes. He started smoking about 50 years ago. He has a 4.5 pack-year smoking history. He has never used smokeless tobacco. He reports that he does not drink alcohol and does not use drugs.  Allergies  Allergen Reactions   Levofloxacin Itching, Other (See Comments) and Hives    [place]    Family History  Problem Relation Age of Onset   Arthritis Mother    Depression Mother    Alzheimer's disease Mother    Alcohol abuse Father    Arthritis Father    COPD Father    Hearing loss Father    Heart disease Father    Colon polyps Sister    Colon cancer Sister 66   Colon polyps Paternal Aunt    Colon cancer Paternal Aunt    Colon polyps Cousin    Colon cancer Cousin    Esophageal cancer Neg Hx    Rectal cancer Neg Hx    Stomach cancer Neg Hx      Prior to Admission medications   Medication Sig Start Date End Date Taking? Authorizing Provider  acetaminophen -codeine  120-12 MG/5ML solution Take 10 mLs by mouth every 6 (six) hours as needed for moderate pain (pain score 4-6). 04/21/24   Mansouraty, Gabriel Jr., MD  Apoaequorin (PREVAGEN PO) Take by  mouth.    [provider]  B-D 3CC LUER-LOK SYR 22GX1-1/2 22G X 1-1/2" 3 ML MISC  01/06/20   [provider]  carboxymethylcellulose (REFRESH PLUS) 0.5 % SOLN Place 1 drop into both eyes 3 (three) times daily as needed (dry eyes).    [provider]  ciclopirox  (PENLAC ) 8 % solution Apply topically at bedtime. Apply over nail and surrounding skin. Apply daily over previous coat. After seven (7) days, may remove with alcohol and continue cycle. 02/14/24   Sikora, Rebecca, DPM  clobetasol cream (TEMOVATE) 0.05 % Apply 1 application  topically daily as needed (dry skin).    [provider]  Coenzyme Q10 (CO Q 10 PO) Take by mouth daily. 2 teaspoon    [provider]  cyanocobalamin  (VITAMIN B12) 1000 MCG tablet Take 1,000 mcg by mouth daily.    [provider]  ferrous sulfate  324 MG TBEC Take 324 mg by mouth.    [provider]  Garlic 2000 MG TBEC Take by mouth.    [provider]  lidocaine  (XYLOCAINE ) 2 % solution TAKE 30 MLS BY MOUTH IN THE MORNING, AT NOON, IN THE EVENING, AND AT BEDTIME. SWISH AND SWALLOW 30 ML FOUR TIMES DAILY 04/21/24   Mansouraty, Albino Alu., MD  Misc Natural Products (PROSTATE HEALTH PO) Take 1 capsule by mouth daily.    [provider]  Omega-3 Fatty Acids (FISH OIL) 1000 MG CAPS Take 1 capsule by mouth daily.     [provider]  omeprazole  (PRILOSEC) 40 MG capsule Take 1 capsule (40 mg total) by mouth 2 (two) times daily. 04/21/24   Mansouraty, Gabriel Jr., MD  psyllium (METAMUCIL) 58.6 % powder Take 1 packet by mouth 3 (three) times daily.    [provider]  Salicylic Acid 3 % OINT Apply 1 application  topically daily as needed (for remove dry, dead skin).    [provider]  sucralfate  (CARAFATE ) 1 g tablet Take 1 tablet (1 g total) by mouth 4 (four) times daily. 4 times daily for 1 month then twice daily for 1 month then stop 04/21/24 04/21/25  Mansouraty, Albino Alu.,  MD  testosterone  cypionate (DEPOTESTOSTERONE CYPIONATE) 200 MG/ML injection Inject 60 mg into the muscle every Thursday.  11/02/17   [provider]  Turmeric (QC TUMERIC COMPLEX PO) Take by mouth.    [provider]    Physical Exam: BP 121/78   Pulse (!) 109   Temp 98.3 F (36.8 C) (Oral)   Resp 18   Ht 5\' 11"  (1.803 m)   Wt 109.8 kg   SpO2 96%   BMI 33.75 kg/m  General: Pleasant, well-appearing elderly man laying in bed. No acute distress. HEENT: [place]/AT. Anicteric sclera.  Dry mucous membrane. CV: Tachycardic. Regular rhythm. No murmurs, rubs, or gallops. No LE edema Pulmonary: Lungs CTAB. Normal effort. No wheezing or rales. Abdominal: Soft, nontender, nondistended. Normal bowel sounds. Extremities: Palpable radial and DP pulses. Normal ROM. Skin: Warm and dry. No obvious rash or lesions. Neuro: A&Ox3. Moves all extremities. Normal sensation to light touch. No focal deficit. Psych: Normal mood and affect          Labs on Admission:  Basic Metabolic Panel: Recent Labs  Lab 04/26/24 2126  NA 132*  K 3.9  CL 100  CO2 23  GLUCOSE 135*  BUN 50*  CREATININE 1.16  CALCIUM 8.9   Liver Function Tests: Recent Labs  Lab 04/26/24 2126  AST 25  ALT 36  ALKPHOS 42  BILITOT 0.8  PROT 6.5  ALBUMIN 3.6   No results for input(s): "LIPASE", "AMYLASE" in the last 168 hours. No results for input(s): "AMMONIA" in the last 168 hours. CBC: Recent Labs  Lab 04/26/24 2126  WBC 12.7*  HGB 14.6  HCT 44.6  MCV 97.2  PLT 239   Cardiac Enzymes: No results for input(s): "CKTOTAL", "CKMB", "CKMBINDEX", "TROPONINI" in the last 168 hours. BNP (last 3 results) No results for input(s): "BNP" in the last 8760 hours.  ProBNP (last 3 results) No results for input(s): "PROBNP" in the last 8760 hours.  CBG: No results for input(s): "GLUCAP" in the last 168 hours.  Radiological Exams on Admission: No results found. Assessment/Plan Jordan Berg is a 68  y.o. male with medical history significant for GERD, Barrett's esophagus, gastritis, HLD, and obesity who presented to the ED for evaluation of melena and fatigue and admitted for upper GI bleed.  # Upper GI bleed # Melena - Recent EGD now presenting with multiple episodes of melena -  Hgb stable at 14.6, had one more melenic stool while in the ED - Slightly tachycardic but hemodynamically stable - Likely secondary to bleeding from recent endoscopic mucosal resection site - GI consulted, will see in a.m. - IV Protonix 40 mg every 12 hours - Trend CBC - N.p.o. at midnight  # Barrett's esophagus # Gastritis # GERD - Had EGD with endoscopic mucosal resection of Barrett's esophagus on 04/21/2024 by Dr. Mansouraty - Surgical pathology shows squamocolumnar junctional mucosa with intestinal (Barrett's mucosa) metaplasia, extensive low-grade dysplasia and focal high-grade dysplasia - Continue IV PPI as above - Continue Carafate   # HLD - Continue omega-3 fatty acid  # Class I obesity Body mass index is 33.75 kg/m. Filed Weights   04/26/24 2040  Weight: 109.8 kg  - Weight loss and nutrition counseling in the outpatient  DVT prophylaxis: SCDs    Code Status: Full Code  Consults called: GI  Family Communication: Discussed admission with spouse at bedside  Severity of Illness: The appropriate patient status for this patient is OBSERVATION. Observation status is judged to be reasonable and necessary in order to provide the required intensity of service to ensure the patient's safety. The patient's presenting symptoms, physical exam findings, and initial radiographic and laboratory data in the context of their medical condition is felt to place them at decreased risk for further clinical deterioration. Furthermore, it is anticipated that the patient will be medically stable for discharge from the hospital within 2 midnights of admission.   Level of care: Med-Surg   This record has been  created using Conservation officer, historic buildings. Errors have been sought and corrected, but may not always be located. Such creation errors do not reflect on the standard of care.   Vita Grip, MD 04/26/2024, 10:11 PM [facility] Pager: [phone] Isaiah 41:10   If 7PM-7AM, please contact night-coverage [email or web address] Password [email or web address]

## 2024-04-26 NOTE — ED Provider Notes (Signed)
 [place] EMERGENCY DEPARTMENT AT [facility] Provider Note   CSN: [id number] Arrival date & time: 04/26/24  2021     History  No chief complaint on file.   Jordan Berg is a 68 y.o. male.  HPI 68 year old male presents with concern for fatigue and black stool.  He states that this morning he had a light-colored stool but then has had about 7 or 8 bowel movements throughout the rest of the day that have all been black.  He has been feeling overall fatigued.  He states his abdomen did not feel quite right earlier in the day but he has no pain currently.  No shortness of breath or chest pain.  He is not on any anticoagulation.  He had an upper endoscopy about 5 days ago but has not had any issues until today.  He called the gastroenterology on-call provider and they told him to come to the ER for evaluation.  Home Medications Prior to Admission medications   Medication Sig Start Date End Date Taking? Authorizing Provider  acetaminophen -codeine  120-12 MG/5ML solution Take 10 mLs by mouth every 6 (six) hours as needed for moderate pain (pain score 4-6). 04/21/24   Mansouraty, Gabriel Jr., MD  Apoaequorin (PREVAGEN PO) Take by mouth.    [provider]  B-D 3CC LUER-LOK SYR 22GX1-1/2 22G X 1-1/2" 3 ML MISC  01/06/20   [provider]  carboxymethylcellulose (REFRESH PLUS) 0.5 % SOLN Place 1 drop into both eyes 3 (three) times daily as needed (dry eyes).    [provider]  ciclopirox  (PENLAC ) 8 % solution Apply topically at bedtime. Apply over nail and surrounding skin. Apply daily over previous coat. After seven (7) days, may remove with alcohol and continue cycle. 02/14/24   Sikora, Rebecca, DPM  clobetasol cream (TEMOVATE) 0.05 % Apply 1 application  topically daily as needed (dry skin).    [provider]  Coenzyme Q10 (CO Q 10 PO) Take by mouth daily. 2 teaspoon    [provider]  cyanocobalamin  (VITAMIN B12) 1000 MCG tablet Take  1,000 mcg by mouth daily.    [provider]  ferrous sulfate  324 MG TBEC Take 324 mg by mouth.    [provider]  Garlic 2000 MG TBEC Take by mouth.    [provider]  lidocaine  (XYLOCAINE ) 2 % solution TAKE 30 MLS BY MOUTH IN THE MORNING, AT NOON, IN THE EVENING, AND AT BEDTIME. SWISH AND SWALLOW 30 ML FOUR TIMES DAILY 04/21/24   Mansouraty, Albino Alu., MD  Misc Natural Products (PROSTATE HEALTH PO) Take 1 capsule by mouth daily.    [provider]  Omega-3 Fatty Acids (FISH OIL) 1000 MG CAPS Take 1 capsule by mouth daily.     [provider]  omeprazole  (PRILOSEC) 40 MG capsule Take 1 capsule (40 mg total) by mouth 2 (two) times daily. 04/21/24   Mansouraty, Gabriel Jr., MD  psyllium (METAMUCIL) 58.6 % powder Take 1 packet by mouth 3 (three) times daily.    [provider]  Salicylic Acid 3 % OINT Apply 1 application  topically daily as needed (for remove dry, dead skin).    [provider]  sucralfate  (CARAFATE ) 1 g tablet Take 1 tablet (1 g total) by mouth 4 (four) times daily. 4 times daily for 1 month then twice daily for 1 month then stop 04/21/24 04/21/25  Mansouraty, Albino Alu., MD  testosterone  cypionate (DEPOTESTOSTERONE CYPIONATE) 200 MG/ML injection Inject 60 mg into  the muscle every Thursday.  11/02/17   [provider]  Turmeric (QC TUMERIC COMPLEX PO) Take by mouth.    [provider]      Allergies    Levofloxacin    Review of Systems   Review of Systems  Respiratory:  Negative for shortness of breath.   Cardiovascular:  Negative for chest pain.  Gastrointestinal:  Negative for abdominal pain and vomiting.    Physical Exam Updated Vital Signs BP 121/78   Pulse (!) 109   Temp 98.3 F (36.8 C) (Oral)   Resp 18   Ht 5\' 11"  (1.803 m)   Wt 109.8 kg   SpO2 96%   BMI 33.75 kg/m  Physical Exam Vitals and nursing note reviewed.  Constitutional:      General: He is not in acute distress.     Appearance: He is well-developed. He is not ill-appearing or diaphoretic.  HENT:     Head: Normocephalic and atraumatic.  Cardiovascular:     Rate and Rhythm: Regular rhythm. Tachycardia present.     Heart sounds: Normal heart sounds.     Comments: Heart rate in the low 100s Pulmonary:     Effort: Pulmonary effort is normal.     Breath sounds: Normal breath sounds.  Abdominal:     General: There is no distension.     Palpations: Abdomen is soft.     Tenderness: There is no abdominal tenderness.  Skin:    General: Skin is warm and dry.  Neurological:     Mental Status: He is alert.     ED Results / Procedures / Treatments   Labs (all labs ordered are listed, but only abnormal results are displayed) Labs Reviewed  COMPREHENSIVE METABOLIC PANEL WITH GFR - Abnormal; Notable for the following components:      Result Value   Sodium 132 (*)    Glucose, Bld 135 (*)    BUN 50 (*)    All other components within normal limits  CBC - Abnormal; Notable for the following components:   WBC 12.7 (*)    All other components within normal limits  POC OCCULT BLOOD, ED - Abnormal; Notable for the following components:   Fecal Occult Bld POSITIVE (*)    All other components within normal limits  TYPE AND SCREEN    EKG None  Radiology No results found.  Procedures Procedures    Medications Ordered in ED Medications  pantoprazole (PROTONIX) injection 80 mg (80 mg Intravenous Given 04/26/24 2151)    ED Course/ Medical Decision Making/ A&P                                 Medical Decision Making Amount and/or Complexity of Data Reviewed External Data Reviewed: notes. Labs: ordered.    Details: Hemoglobin is 14 which is normal but lower than his typical baseline of 17.  BUN is elevated at 50.  Risk Prescription drug management. Decision regarding hospitalization.   Patient's rectal exam was difficult as patient is raw from multiple bowel movements.  However there is clearly  melena both on exam and on Hemoccult testing.  Hemoglobin is lower than baseline at 14 and his BUN is significantly elevated.  He will need serial hemoglobins.  I have given him a dose of IV Protonix.  I have contacted GI on-call, Dr. Yvone Herd, who advises IV Protonix twice per day, n.p.o. at midnight, and the GI team will  see in the morning.  Discussed with Dr. Yvonne Hering for admission.        Final Clinical Impression(s) / ED Diagnoses Final diagnoses:  Melena    Rx / DC Orders ED Discharge Orders     None         Jerilynn Montenegro, MD 04/26/24 2210

## 2024-04-26 NOTE — Telephone Encounter (Signed)
 I was contacted on-call by Mr. Jordan Berg reporting symptoms of multiple dark bowel movements, malaise and queasiness status post upper endoscopy with endoscopic mucosal resection (EMR) of Barrett's esophagus on 04/21/2024 by Dr. Brice Campi.  Mr. Jordan Berg states that he was clinically stable after his procedure earlier this week Has been taking Meprazole, GI cocktail and Carafate  as prescribed  This morning noted malaise when he awoke and felt queasy Over the course of the day has had 6 dark bowel movements that he describes as being black and loose in nature No bright red blood per rectum Denies chest pain, dysphagia, odynophagia, abdominal pain, vomiting Denies feeling lightheaded or dizzy  Discussed that his symptoms could represent bleeding from EMR site and advised ER evaluation to assess hemodynamics and labs Patient and wife agreed to go to ER-either [facility] or [facility]

## 2024-04-27 ENCOUNTER — Ambulatory Visit: Payer: Self-pay | Admitting: Gastroenterology

## 2024-04-27 DIAGNOSIS — K2270 Barrett's esophagus without dysplasia: Secondary | ICD-10-CM

## 2024-04-27 DIAGNOSIS — D62 Acute posthemorrhagic anemia: Secondary | ICD-10-CM | POA: Diagnosis not present

## 2024-04-27 DIAGNOSIS — K22711 Barrett's esophagus with high grade dysplasia: Secondary | ICD-10-CM | POA: Diagnosis present

## 2024-04-27 DIAGNOSIS — K922 Gastrointestinal hemorrhage, unspecified: Secondary | ICD-10-CM

## 2024-04-27 DIAGNOSIS — E7800 Pure hypercholesterolemia, unspecified: Secondary | ICD-10-CM | POA: Diagnosis present

## 2024-04-27 DIAGNOSIS — Z87891 Personal history of nicotine dependence: Secondary | ICD-10-CM | POA: Diagnosis not present

## 2024-04-27 DIAGNOSIS — Z6833 Body mass index (BMI) 33.0-33.9, adult: Secondary | ICD-10-CM | POA: Diagnosis not present

## 2024-04-27 DIAGNOSIS — Y838 Other surgical procedures as the cause of abnormal reaction of the patient, or of later complication, without mention of misadventure at the time of the procedure: Secondary | ICD-10-CM | POA: Diagnosis present

## 2024-04-27 DIAGNOSIS — K2211 Ulcer of esophagus with bleeding: Secondary | ICD-10-CM | POA: Diagnosis not present

## 2024-04-27 DIAGNOSIS — K9189 Other postprocedural complications and disorders of digestive system: Secondary | ICD-10-CM

## 2024-04-27 DIAGNOSIS — E66811 Obesity, class 1: Secondary | ICD-10-CM | POA: Diagnosis present

## 2024-04-27 DIAGNOSIS — K91840 Postprocedural hemorrhage and hematoma of a digestive system organ or structure following a digestive system procedure: Secondary | ICD-10-CM | POA: Diagnosis present

## 2024-04-27 DIAGNOSIS — K219 Gastro-esophageal reflux disease without esophagitis: Secondary | ICD-10-CM | POA: Diagnosis present

## 2024-04-27 DIAGNOSIS — K921 Melena: Secondary | ICD-10-CM | POA: Diagnosis not present

## 2024-04-27 LAB — BASIC METABOLIC PANEL WITH GFR
Anion gap: 5 (ref 5–15)
BUN: 49 mg/dL — ABNORMAL HIGH (ref 8–23)
CO2: 25 mmol/L (ref 22–32)
Calcium: 8.5 mg/dL — ABNORMAL LOW (ref 8.9–10.3)
Chloride: 106 mmol/L (ref 98–111)
Creatinine, Ser: 1.15 mg/dL (ref 0.61–1.24)
GFR, Estimated: >60 mL/min (ref >60)
Glucose, Bld: 103 mg/dL — ABNORMAL HIGH (ref 70–99)
Potassium: 3.9 mmol/L (ref 3.5–5.1)
Sodium: 136 mmol/L (ref 135–145)

## 2024-04-27 LAB — CBC
HCT: 40.2 % (ref 39.0–52.0)
Hemoglobin: 12.7 g/dL — ABNORMAL LOW (ref 13.0–17.0)
MCH: 32.2 pg (ref 26.0–34.0)
MCHC: 31.6 g/dL (ref 30.0–36.0)
MCV: 101.8 fL — ABNORMAL HIGH (ref 80.0–100.0)
Platelets: 182 10*3/uL (ref 150–400)
RBC: 3.95 MIL/uL — ABNORMAL LOW (ref 4.22–5.81)
RDW: 13.1 % (ref 11.5–15.5)
WBC: 10.3 10*3/uL (ref 4.0–10.5)
nRBC: 0 % (ref 0.0–0.2)

## 2024-04-27 LAB — HIV ANTIBODY (ROUTINE TESTING W REFLEX): HIV Screen 4th Generation wRfx: NONREACTIVE

## 2024-04-27 MED ORDER — SUCRALFATE 1 GM/10ML PO SUSP
1.0000 g | Freq: Four times a day (QID) | ORAL | Status: DC
  Administered 2024-04-27 – 2024-04-29 (×5): 1 g via ORAL
  Filled 2024-04-27 (×7): qty 10

## 2024-04-27 NOTE — TOC Initial Note (Signed)
 Transition of Care [facility]) - Initial/Assessment Note    Patient Details  Name: Jordan Berg MRN: [id number] Date of Birth: Jan 13, 1956  [facility] CM/SW Contact:    Bari Leys, RN Phone Number: 04/27/2024, 10:53 AM  Clinical Narrative:  Met with patient and spouse at bedside to introduce role of TOC/NCM and review fo dc planning, pt confirmed he has an established PCP and pharmacy, no current home care services or home DME, reports he resides with his spouse and feels safe returning home. MOON explained to spouse and his wife, patient declined to sign, copy provided to patient. NCM will notify patient tomorrow if admit status changes to inpatient. TOC will continue to follow.                  Expected Discharge Plan: Home/Self Care     Patient Goals and CMS Choice Patient states their goals for this hospitalization and ongoing recovery are:: return home          Expected Discharge Plan and Services       Living arrangements for the past 2 months: Single Family Home                                      Prior Living Arrangements/Services Living arrangements for the past 2 months: Single Family Home   Patient language and need for interpreter reviewed:: Yes Do you feel safe going back to the place where you live?: Yes        Care giver support system in place?: Yes (comment)   Criminal Activity/Legal Involvement Pertinent to Current Situation/Hospitalization: No - Comment as needed  Activities of Daily Living   ADL Screening (condition at time of admission) Independently performs ADLs?: Yes (appropriate for developmental age) Is the patient deaf or have difficulty hearing?: No Does the patient have difficulty seeing, even when wearing glasses/contacts?: No Does the patient have difficulty concentrating, remembering, or making decisions?: No  Permission Sought/Granted                  Emotional Assessment Appearance:: Appears  stated age Attitude/Demeanor/Rapport: Engaged Affect (typically observed): Accepting Orientation: : Oriented to Self, Oriented to Place, Oriented to  Time, Oriented to Situation Alcohol / Substance Use: Not Applicable Psych Involvement: No (comment)  Admission diagnosis:  Melena [K92.1] Upper GI bleed [K92.2] Patient Active Problem List   Diagnosis Date Noted   Upper GI bleed 04/26/2024   Melena 04/26/2024   Barrett's esophagus with dysplasia 04/21/2024   Nodule of esophagus 04/21/2024   Psoriasis 04/26/2023   Other chest pain 11/10/2021   Olecranon bursitis of left elbow 10/28/2021   Acute lower GI bleeding 11/24/2018   History of colonic polyps 11/24/2018   GI bleeding 11/24/2018   Gastritis and gastroduodenitis    Hypercholesterolemia 12/11/2017   CHOLELITHIASIS 12/28/2008   RUQ pain 12/25/2008   ABDOMINAL PAIN 12/24/2008   PCP:  Shelvia Dick, MD Pharmacy:   [facility] [phone] - [place], [place] - [address] [place] [address] Phone: [phone] Fax: [phone]     Social Drivers of Health (SDOH) Social History: SDOH Screenings   Food Insecurity: No Food Insecurity (04/27/2024)  Housing: Low Risk  (04/27/2024)  Transportation Needs: No Transportation Needs (04/27/2024)  Utilities: Not At Risk (04/27/2024)  Depression (PHQ2-9): Low Risk  (01/30/2020)  Social Connections: Moderately Isolated (04/27/2024)  Tobacco Use: Medium Risk (04/26/2024)   SDOH Interventions:     Readmission Risk Interventions     No data to display

## 2024-04-27 NOTE — Telephone Encounter (Signed)
 NMG, I am sorry to hear this. Thank you for taking the call last night. I have reviewed the chart. I see his Hgb relatively stable, but his BUN is quite elevated. Certainly at risk for EMR site bleeding. I'll review things further tomorrow. GM

## 2024-04-27 NOTE — Hospital Course (Signed)
 68 y.o. M with obesity, Barrett's esophagus, recent EGD and mucosal resection who presented with melena, Hgb down to 14 from 17.  Admitted for overnight observation.

## 2024-04-27 NOTE — Care Management Obs Status (Signed)
 MEDICARE OBSERVATION STATUS NOTIFICATION   Patient Details  Name: Jordan Berg MRN: [id number] Date of Birth: [birth year]   Medicare Observation Status Notification Given:  Yes    Bari Leys, RN 04/27/2024, 10:32 AM

## 2024-04-27 NOTE — Progress Notes (Signed)
  Progress Note   Patient: Jordan Berg ZOX:[id number] DOB: 1956-11-08 DOA: 04/26/2024     0 DOS: the patient was seen and examined on 04/27/2024 at 8:39 AM      Brief hospital course: 68 y.o. M with obesity, Barrett's esophagus, recent EGD and mucosal resection who presented with melena, Hgb down to 14 from 17.  Admitted for overnight observation.     Assessment and Plan: Acute GI bleed, likely post mucosal resection bleeding of esophageal nodule Hemodynamically stable, but hemoglobin down to 12.7 from baseline 17 today.  Still with 2 black and tar-like stools today. - Continue IV PPI - Carafate  - Trend CBC - CLD - Likely EGD tomorrow   Obesity, class I Complicates care          Subjective: Patient still having melena, he has no abdominal pain, he feels little bit weak and tired      Physical Exam: BP (!) 148/85 (BP Location: Left Arm)   Pulse 97   Temp 98.3 F (36.8 C) (Oral)   Resp 18   Ht 5\' 11"  (1.803 m)   Wt 109.8 kg   SpO2 99%   BMI 33.75 kg/m   Adult male, lying in bed, interactive and appropriate RRR, no murmurs, no peripheral edema Respiratory normal, lungs clear without rales or wheezes   Data Reviewed: CBC shows hemoglobin down to 12 Basic metabolic panel shows increased BUN to creatinine ratio  Family Communication: Wife at the bedside    Disposition: Status is: Inpatient         Author: Ephriam Hashimoto, MD 04/27/2024 3:14 PM  For on call review [email or web address].

## 2024-04-27 NOTE — Consult Note (Signed)
 Chief Complaint: Melena  Referring Provider:  No ref. provider found      ASSESSMENT AND PLAN;   #1.  Barrett's nodule s/p EGD with EMR followed by PuraStat 5/12, now with post EMR bleeding. HD stable. Bx- 1 cm polyp.  No Ca but did show high-grade dysplasia  Plan: - IV Protonix - Carafate  elixir - Trend CBC - OK for clear liquid diet - NPO past MN. Dr Bernabe Brew aware. If continued melena, EGD in AM with Dr Bernabe Brew.  - If with significant GI bleed, then will perform emergent EGD tonight - D/W pt and wife extensively.   HPI:    Jordan Berg is a 68 y.o. male  With GERD, Barrett's, HLD, obesity, hypogonadism on testosterone  S/P EGD with EMR of esophageal nodule 04/21/2024 Admitted with melena, lightheadedness and dizziness since 5/17 AM Hb 17 (baseline) to 14.6 to 12.7 today  No chest pains.  No fever or chills. Currently denies having any nausea or vomiting.  No odynophagia or dysphagia..      Latest Ref Rng & Units 04/27/2024    5:32 AM 04/26/2024    9:26 PM 01/04/2024    9:44 AM  CBC  WBC 4.0 - 10.5 K/uL 10.3  12.7  7.2   Hemoglobin 13.0 - 17.0 g/dL 16.1  09.6  04.5   Hematocrit 39.0 - 52.0 % 40.2  44.6  50.9   Platelets 150 - 400 K/uL 182  239  178.0      Review of previous GI procedures:  EGD with EMR 04/21/2024 (Dr Bernabe Brew) - No gross lesions in the proximal esophagus and in the mid esophagus. - Esophageal mucosal changes consistent with short- segment Barrett' s esophagus. As noted above, the area was nodular with erosion/ ulceration and not appropriate for RFA without first resection of the area. EMR CAP Band ligation performed. Snare mucosal resection performed. PuraStat injected. - Non- obstructing Schatzki ring. - 2 cm hiatal hernia. - Erythematous mucosa in the stomach. - No gross lesions in the duodenal bulb, in the first portion of the duodenum and in the second portion of the duodenum. Bx: Gross: 1.1 x 0.9 x 0.5 cm tan-red polyp.  -Bx- Path: Squamocolumnar  junctional mucosa with intestinal (Barrett mucosa)  metaplasia, extensive low-grade dysplasia and focal high-grade  dysplasia.  Past Medical History:  Diagnosis Date   Arthritis    mild   Cataract    forming   Colon polyps 01/20/2009; 11/24/18; 04/2019   2010: NON-adenomatous-recall 10 yrs.  11/2018->adenomatous. repeat 05/03/19 mo b/c of piecemeal resection of one of the polyps->small area at 11/2018 polypectomy site bx'd->benign.  +Hx of postpolypectomy bleeding-->colonoscopy was done 3d after 11/2018 polypectomy to fix this.   GERD (gastroesophageal reflux disease)    +Barrett's esoph 02/2024   Helicobacter pylori gastritis 02/2018   EGD+.  Stool antigen NEG after abx treatment.   Hepatic steatosis    Noted on MRI abd 12/2017 (done for RUQ pain). also noted on EUS.   Hypercholesterolemia 12/2017   Recommended statin 12/2017: pt chose [facility] and repeat chol 3 mo. Recommended statin 01/2020.   Kidney stone    Obesity, Class II, BMI 35-39.9    Primary hypogonadism in male    Managed by pt's urologist (including biannual DREs, Hbs and PSAs).   RUQ abdominal pain Dec 2018;Jan 2019   Labs nl, abd plain films nl, MRCP nl.  GI did EUS 2019, normal except chronic gastritis, + H pylori.  Stool antigen test NEG after abx  tx.    Past Surgical History:  Procedure Laterality Date   CHOLECYSTECTOMY  2010   COLONOSCOPY     COLONOSCOPY N/A 11/25/2018   Adenomatous: recall 6 mo due to piecemeal resection of one of the polyps.  Procedure: COLONOSCOPY;  Surgeon: Brice Campi Albino Alu., MD;  Location: [facility];  Service: Gastroenterology;  Laterality: N/A;   COLONOSCOPY  11/28/2018   for post polypectomy bleeding.  Vessel cauterized/clipped   COLONOSCOPY  05/03/2019   f/u view of polypectomy site from 11/2018 was done due to piecemeal resection. Bx ->inflam/benign. Recall 3 yrs.   COLONOSCOPY W/ POLYPECTOMY  01/20/2009   Multiple nonadenomatous polyps.  Recall 10 yrs.   ENDOSCOPIC MUCOSAL RESECTION  N/A 04/21/2024   Procedure: RESECTION, MUCOSAL LESION, GI TRACT, ENDOSCOPIC;  Surgeon: Brice Campi, Albino Alu., MD;  Location: [facility];  Service: Gastroenterology;  Laterality: N/A;   ESOPHAGOGASTRODUODENOSCOPY  02/14/2018   +EUS: Chronic active gastritis, + H PYLORI, tx'd with abx by GI, stool antigen test NEG after abx (04/2018)   ESOPHAGOGASTRODUODENOSCOPY     02/2024, Barrett's esoph with focal high grade dysplasia-->plan for ablation   ESOPHAGOGASTRODUODENOSCOPY N/A 04/21/2024   Some low grade and high grade dysplasia on path. Procedure: EGD (ESOPHAGOGASTRODUODENOSCOPY);  Surgeon: Normie Becton., MD;  Location: [facility];  Service: Gastroenterology;  Laterality: N/A;   EUS N/A 02/14/2018   Procedure: UPPER ENDOSCOPIC ULTRASOUND (EUS) LINEAR;  Surgeon: Janel Medford, MD;  Location: [facility];  Service: Endoscopy;  Laterality: N/A;   HOT HEMOSTASIS N/A 11/25/2018   Procedure: HOT HEMOSTASIS (ARGON PLASMA COAGULATION/BICAP);  Surgeon: Normie Becton., MD;  Location: [facility];  Service: Gastroenterology;  Laterality: N/A;   KNEE SURGERY Left 2000 and 2017   arthroscopic   LUMBAR SPINE SURGERY  2000   No hardware   NASAL SEPTUM SURGERY  1981   POLYPECTOMY     SUBMUCOSAL INJECTION  11/25/2018   Procedure: SUBMUCOSAL INJECTION;  Surgeon: Normie Becton., MD;  Location: [facility];  Service: Gastroenterology;;   TONSILLECTOMY  1978    Family History  Problem Relation Age of Onset   Arthritis Mother    Depression Mother    Alzheimer's disease Mother    Alcohol abuse Father    Arthritis Father    COPD Father    Hearing loss Father    Heart disease Father    Colon polyps Sister    Colon cancer Sister 63   Colon polyps Paternal Aunt    Colon cancer Paternal Aunt    Colon polyps Cousin    Colon cancer Cousin    Esophageal cancer Neg Hx    Rectal cancer Neg Hx    Stomach cancer Neg Hx     Social History   Tobacco Use   Smoking status:  Former    Current packs/day: 0.00    Average packs/day: 1.5 packs/day for 3.0 years (4.5 ttl pk-yrs)    Types: Cigarettes    Start date: 12/11/1973    Quit date: 12/11/1976    Years since quitting: 47.4   Smokeless tobacco: Never  Vaping Use   Vaping status: Never Used  Substance Use Topics   Alcohol use: No   Drug use: No    Current Facility-Administered Medications  Medication Dose Route Frequency Provider Last Rate Last Admin   acetaminophen  (TYLENOL ) tablet 650 mg  650 mg Oral Q6H PRN Amponsah, Prosper M, MD       Or   acetaminophen  (TYLENOL ) suppository 650 mg  650 mg Rectal Q6H  PRN Amponsah, Prosper M, MD       melatonin tablet 3 mg  3 mg Oral QHS Amponsah, Prosper M, MD   3 mg at 04/26/24 2358   omega-3 acid ethyl esters (LOVAZA) capsule 1 g  1 g Oral Daily Amponsah, Prosper M, MD   1 g at 04/27/24 0454   ondansetron  (ZOFRAN ) tablet 4 mg  4 mg Oral Q6H PRN Amponsah, Prosper M, MD       Or   ondansetron  (ZOFRAN ) injection 4 mg  4 mg Intravenous Q6H PRN Amponsah, Prosper M, MD       pantoprazole (PROTONIX) injection 40 mg  40 mg Intravenous Q12H Amponsah, Prosper M, MD   40 mg at 04/27/24 0981   senna-docusate (Senokot-S) tablet 1 tablet  1 tablet Oral QHS PRN Vita Grip, MD       sucralfate  (CARAFATE ) tablet 1 g  1 g Oral QID Amponsah, Prosper M, MD   1 g at 04/27/24 1914    Allergies  Allergen Reactions   Levofloxacin Itching, Other (See Comments) and Hives    Whelps    Review of Systems:  Neg except for dizziness     Physical Exam:    BP 90/75 (BP Location: Left Arm)   Pulse 92   Temp 97.8 F (36.6 C) (Oral)   Resp 16   Ht 5\' 11"  (1.803 m)   Wt 109.8 kg   SpO2 98%   BMI 33.75 kg/m  Wt Readings from Last 3 Encounters:  04/26/24 109.8 kg  04/21/24 110.7 kg  02/20/24 113.4 kg   Constitutional:  Well-developed, in no acute distress. Psychiatric: Normal mood and affect. Behavior is normal. HEENT: Pupils normal.  Conjunctivae are normal. No scleral  icterus.  Cardiovascular: Normal rate, regular rhythm. No edema Pulmonary/chest: Effort normal and breath sounds normal. No wheezing, rales or rhonchi. Abdominal: Soft, nondistended. Nontender. Bowel sounds active throughout. There are no masses palpable. No hepatomegaly. Rectal: Deferred Neurological: Alert and oriented to person place and time. Skin: Skin is warm and dry. No rashes noted.  Data Reviewed: I have personally reviewed following labs and imaging studies  CBC:    Latest Ref Rng & Units 04/27/2024    5:32 AM 04/26/2024    9:26 PM 01/04/2024    9:44 AM  CBC  WBC 4.0 - 10.5 K/uL 10.3  12.7  7.2   Hemoglobin 13.0 - 17.0 g/dL 78.2  95.6  21.3   Hematocrit 39.0 - 52.0 % 40.2  44.6  50.9   Platelets 150 - 400 K/uL 182  239  178.0     CMP:    Latest Ref Rng & Units 04/27/2024    5:32 AM 04/26/2024    9:26 PM 07/11/2023   10:00 AM  CMP  Glucose 70 - 99 mg/dL 086  578  469   BUN 8 - 23 mg/dL 49  50  18   Creatinine 0.61 - 1.24 mg/dL 6.29  5.28  4.13   Sodium 135 - 145 mmol/L 136  132  139   Potassium 3.5 - 5.1 mmol/L 3.9  3.9  3.7   Chloride 98 - 111 mmol/L 106  100  102   CO2 22 - 32 mmol/L 25  23  30    Calcium 8.9 - 10.3 mg/dL 8.5  8.9  9.3   Total Protein 6.5 - 8.1 g/dL  6.5  7.2   Total Bilirubin 0.0 - 1.2 mg/dL  0.8  1.0   Alkaline Phos 38 -  126 U/L  42  38   AST 15 - 41 U/L  25  26   ALT 0 - 44 U/L  36  31     GFR: Estimated Creatinine Clearance: 76.4 mL/min (by C-G formula based on SCr of 1.15 mg/dL). Liver Function Tests: Recent Labs  Lab 04/26/24 2126  AST 25  ALT 36  ALKPHOS 42  BILITOT 0.8  PROT 6.5  ALBUMIN 3.6      Magnus Schuller, MD 04/27/2024, 11:40 AM  Cc: No ref. provider found

## 2024-04-27 NOTE — Plan of Care (Signed)

## 2024-04-28 ENCOUNTER — Encounter (HOSPITAL_COMMUNITY): Payer: Self-pay | Admitting: Family Medicine

## 2024-04-28 ENCOUNTER — Inpatient Hospital Stay (HOSPITAL_COMMUNITY): Admitting: Anesthesiology

## 2024-04-28 ENCOUNTER — Encounter (HOSPITAL_COMMUNITY)
Admission: EM | Disposition: A | Discharge status: Home / Self Care | Payer: Self-pay | Source: Home / Self Care | Attending: Family Medicine

## 2024-04-28 DIAGNOSIS — K922 Gastrointestinal hemorrhage, unspecified: Secondary | ICD-10-CM

## 2024-04-28 DIAGNOSIS — D62 Acute posthemorrhagic anemia: Secondary | ICD-10-CM | POA: Diagnosis not present

## 2024-04-28 DIAGNOSIS — K921 Melena: Secondary | ICD-10-CM

## 2024-04-28 DIAGNOSIS — K2211 Ulcer of esophagus with bleeding: Secondary | ICD-10-CM | POA: Diagnosis not present

## 2024-04-28 DIAGNOSIS — K22711 Barrett's esophagus with high grade dysplasia: Secondary | ICD-10-CM

## 2024-04-28 HISTORY — PX: ESOPHAGOGASTRODUODENOSCOPY: SHX5428

## 2024-04-28 LAB — PROTIME-INR
INR: 1.1 (ref 0.8–1.2)
Prothrombin Time: 14.6 s (ref 11.4–15.2)

## 2024-04-28 LAB — BASIC METABOLIC PANEL WITH GFR
Anion gap: 8 (ref 5–15)
BUN: 33 mg/dL — ABNORMAL HIGH (ref 8–23)
CO2: 24 mmol/L (ref 22–32)
Calcium: 8.5 mg/dL — ABNORMAL LOW (ref 8.9–10.3)
Chloride: 104 mmol/L (ref 98–111)
Creatinine, Ser: 1.11 mg/dL (ref 0.61–1.24)
GFR, Estimated: >60 mL/min (ref >60)
Glucose, Bld: 91 mg/dL (ref 70–99)
Potassium: 3.7 mmol/L (ref 3.5–5.1)
Sodium: 136 mmol/L (ref 135–145)

## 2024-04-28 LAB — CBC
HCT: 37.8 % — ABNORMAL LOW (ref 39.0–52.0)
Hemoglobin: 12.3 g/dL — ABNORMAL LOW (ref 13.0–17.0)
MCH: 32.1 pg (ref 26.0–34.0)
MCHC: 32.5 g/dL (ref 30.0–36.0)
MCV: 98.7 fL (ref 80.0–100.0)
Platelets: 171 10*3/uL (ref 150–400)
RBC: 3.83 MIL/uL — ABNORMAL LOW (ref 4.22–5.81)
RDW: 13.4 % (ref 11.5–15.5)
WBC: 8.1 10*3/uL (ref 4.0–10.5)
nRBC: 0 % (ref 0.0–0.2)

## 2024-04-28 SURGERY — EGD (ESOPHAGOGASTRODUODENOSCOPY)
Anesthesia: Monitor Anesthesia Care

## 2024-04-28 MED ORDER — SODIUM CHLORIDE (PF) 0.9 % IJ SOLN
PREFILLED_SYRINGE | INTRAMUSCULAR | Status: DC | PRN
  Administered 2024-04-28: 2 mL

## 2024-04-28 MED ORDER — PANTOPRAZOLE SODIUM 40 MG PO TBEC
40.0000 mg | DELAYED_RELEASE_TABLET | Freq: Two times a day (BID) | ORAL | Status: DC
  Administered 2024-04-29: 40 mg via ORAL
  Filled 2024-04-28 (×2): qty 1

## 2024-04-28 MED ORDER — SODIUM CHLORIDE 0.9 % IV SOLN: INTRAVENOUS | Status: AC

## 2024-04-28 MED ORDER — ONDANSETRON HCL 4 MG/2ML IJ SOLN: 4.0000 mg | Freq: Four times a day (QID) | INTRAMUSCULAR | Status: DC | PRN

## 2024-04-28 MED ORDER — PROPOFOL 500 MG/50ML IV EMUL
INTRAVENOUS | Status: AC
  Filled 2024-04-28: qty 200

## 2024-04-28 MED ORDER — LIDOCAINE HCL (PF) 2 % IJ SOLN
INTRAMUSCULAR | Status: DC | PRN
  Administered 2024-04-28: 100 mg via INTRADERMAL

## 2024-04-28 MED ORDER — FENTANYL CITRATE (PF) 100 MCG/2ML IJ SOLN: 25.0000 ug | INTRAMUSCULAR | Status: DC | PRN

## 2024-04-28 MED ORDER — PROPOFOL 500 MG/50ML IV EMUL
INTRAVENOUS | Status: DC | PRN
  Administered 2024-04-28: 125 ug/kg/min via INTRAVENOUS
  Administered 2024-04-28: 100 mg via INTRAVENOUS

## 2024-04-28 MED ORDER — PROPOFOL 1000 MG/100ML IV EMUL
INTRAVENOUS | Status: AC
  Filled 2024-04-28: qty 200

## 2024-04-28 MED ORDER — OXYCODONE HCL 5 MG PO TABS: 5.0000 mg | ORAL_TABLET | Freq: Once | ORAL | Status: DC | PRN

## 2024-04-28 MED ORDER — PANTOPRAZOLE SODIUM 40 MG IV SOLR
40.0000 mg | Freq: Two times a day (BID) | INTRAVENOUS | Status: AC
  Administered 2024-04-28: 40 mg via INTRAVENOUS
  Filled 2024-04-28 (×2): qty 10

## 2024-04-28 MED ORDER — OXYCODONE HCL 5 MG/5ML PO SOLN: 5.0000 mg | Freq: Once | ORAL | Status: DC | PRN

## 2024-04-28 NOTE — Op Note (Signed)
 [facility] Patient Name: Jordan Berg Procedure Date: 04/28/2024 MRN: [id number] Attending MD: Yong Henle , MD, [id number] Date of Birth: [birth year] CSN: [id number] Age: 68 Admit Type: Inpatient Procedure:                Upper GI endoscopy Indications:              Acute post hemorrhagic anemia, Melena, Recent                            gastrointestinal bleeding Providers:                Yong Henle, MD, Maxie Spaniel, RN,                            Tyrus Gallus, Technician, Judith Novak, Technician Referring MD:              Medicines:                Monitored Anesthesia Care Complications:            No immediate complications. Estimated Blood Loss:     Estimated blood loss was minimal. Procedure:                Pre-Anesthesia Assessment:                           - Prior to the procedure, a History and Physical                            was performed, and patient medications and                            allergies were reviewed. The patient's tolerance of                            previous anesthesia was also reviewed. The risks                            and benefits of the procedure and the sedation                            options and risks were discussed with the patient.                            All questions were answered, and informed consent                            was obtained. Prior Anticoagulants: The patient has                            taken no anticoagulant or antiplatelet agents. ASA                            Grade Assessment: III - A patient with severe  systemic disease. After reviewing the risks and                            benefits, the patient was deemed in satisfactory                            condition to undergo the procedure.                           After obtaining informed consent, the endoscope was                            passed under direct vision. Throughout the                             procedure, the patient's blood pressure, pulse, and                            oxygen saturations were monitored continuously. The                            [id number]-H190 ([id number]) Olympus endoscope was introduced                            through the mouth, and advanced to the second part                            of duodenum. The upper GI endoscopy was                            accomplished without difficulty. The patient                            tolerated the procedure. Scope In: Scope Out: Findings:      No gross lesions were noted in the proximal esophagus and in the mid       esophagus.      One cratered esophageal ulcer extending over the GE junction into the       very proximal gastric cardia (from recent EMR of Barrett's HGD) was       found. The majority of the esophagus ulcer is clean based. There is       evidence of a nonbleeding visible vessel in the area of the most distal       portion of the resection site within the gastric cardia/GE junction with       stigmata of recent bleeding. The ulcerated lesion was 25 mm in largest       dimension. Area was successfully injected with 2 mL of a 1:100,000       solution of epinephrine  for hemostasis. Coagulation for additional       hemostasis using bipolar probe was successful. To prevent bleeding       post-intervention, two hemostatic clips were successfully placed (MR       conditional). Clip manufacturer: [employer]. There was no       bleeding during, or at the end, of the procedure. Area was successfully  injected with 3 mL PuraStat for additional hemostasis.      No gross lesions were noted in the entire examined stomach.      No gross lesions were noted in the duodenal bulb, in the first portion       of the duodenum and in the second portion of the duodenum. Impression:               - No gross lesions in the proximal esophagus and in                            the mid esophagus.                            - Esophageal ulcer from recent EMR with stigmata of                            a NBVV in the distal aspect of the resection site.                            Injected epinephrine . Treated with bipolar cautery.                            Clips (MR conditional) were placed. Clip                            manufacturer: [employer]. PuraStat placed.                           - No gross lesions in the entire stomach.                           - No gross lesions in the duodenal bulb, in the                            first portion of the duodenum and in the second                            portion of the duodenum. Moderate Sedation:      Not Applicable - Patient had care per Anesthesia. Recommendation:           - The patient will be observed post-procedure,                            until all discharge criteria are met.                           - Return patient to hospital ward for ongoing care.                           - Full liquid diet today and may advance tomorrow                            morning.                           -  Observe patient's clinical course.                           - Trend hemoglobin/hematocrit into tomorrow.                           - PPI IV twice daily today and transition tomorrow                            morning to p.o. PPI twice daily.                           - Carafate  liquid slurry 4 times daily. At time of                            discharge, patient willing to get liquid Carafate                             (even with increased cost).                           - Observe patient's clinical course.                           - Hopeful for discharge tomorrow.                           - Repeat endoscopy in 3 to 4 months for RFA, to be                            scheduled.                           - The findings and recommendations were discussed                            with the patient.                           - The findings  and recommendations were discussed                            with the patient's family. Procedure Code(s):        --- Professional ---                           [phone], Esophagogastroduodenoscopy, flexible,                            transoral; with control of bleeding, any method Diagnosis Code(s):        --- Professional ---                           K22.11, Ulcer of esophagus with bleeding  D62, Acute posthemorrhagic anemia                           K92.1, Melena (includes Hematochezia)                           K92.2, Gastrointestinal hemorrhage, unspecified CPT copyright 2022 American Medical Association. All rights reserved. The codes documented in this report are preliminary and upon coder review may  be revised to meet current compliance requirements. Yong Henle, MD 04/28/2024 12:17:27 PM Number of Addenda: 0

## 2024-04-28 NOTE — Progress Notes (Signed)
  Progress Note   Patient: Jordan Berg ZOX:[id number] DOB: 09-02-1956 DOA: 04/26/2024     1 DOS: the patient was seen and examined on 04/28/2024 at 10:02AM      Brief hospital course: 68 y.o. M with obesity, Barrett's esophagus, recent EGD and mucosal resection who presented with melena, Hgb down to 14 from 17.  Admitted for overnight observation.     Assessment and Plan: Acute upper GI bleed after mucosal resection Patient admitted, placed on PPI.  Underwent EGD that showed nonbleeding visible vessel which was clipped today by Dr. Jonne Netters Roddy. - GI has advance diet - Continue PPI - Continue Carafate   Acute blood loss anemia Hemoglobin down to 12 from baseline 17.  Stable overnight.  Bowel movements less melena.  Obesity Class I obesity, BMI 33        Subjective: Patient's bowel movements are less tar-like.  No dizziness, no hypotension, no confusion.     Physical Exam: BP 133/76 (BP Location: Left Arm)   Pulse 86   Temp 97.7 F (36.5 C) (Oral)   Resp 16   Ht 5\' 11"  (1.803 m)   Wt 109.8 kg   SpO2 99%   BMI 33.75 kg/m   Adult male, sitting up in bed, interactive and appropriate RRR, no murmurs, no peripheral edema Respiratory normal, lungs clear without rales or wheezes Abdomen soft no tenderness palpation or guarding   Data Reviewed: CBC shows stable hemoglobin Discussed with GI  Family Communication: Wife at the bedside    Disposition: Status is: Inpatient         Author: Ephriam Hashimoto, MD 04/28/2024 4:07 PM  For on call review [email or web address].

## 2024-04-28 NOTE — Anesthesia Preprocedure Evaluation (Signed)
 Anesthesia Evaluation  Patient identified by MRN, date of birth, ID band Patient awake    Reviewed: Allergy & Precautions, H&P , NPO status , Patient's Chart, lab work & pertinent test results  Airway Mallampati: II   Neck ROM: full    Dental   Pulmonary former smoker   breath sounds clear to auscultation       Cardiovascular negative cardio ROS  Rhythm:regular Rate:Normal     Neuro/Psych    GI/Hepatic ,GERD  ,,GI bleeding   Endo/Other    Renal/GU      Musculoskeletal  (+) Arthritis ,    Abdominal   Peds  Hematology   Anesthesia Other Findings   Reproductive/Obstetrics                             Anesthesia Physical Anesthesia Plan  ASA: 2  Anesthesia Plan: MAC   Post-op Pain Management:    Induction: Intravenous  PONV Risk Score and Plan: 1 and Propofol  infusion and Treatment may vary due to age or medical condition  Airway Management Planned: Nasal Cannula  Additional Equipment:   Intra-op Plan:   Post-operative Plan:   Informed Consent: I have reviewed the patients History and Physical, chart, labs and discussed the procedure including the risks, benefits and alternatives for the proposed anesthesia with the patient or authorized representative who has indicated his/her understanding and acceptance.     Dental advisory given  Plan Discussed with: CRNA, Anesthesiologist and Surgeon  Anesthesia Plan Comments:        Anesthesia Quick Evaluation

## 2024-04-28 NOTE — Transfer of Care (Signed)
 Immediate Anesthesia Transfer of Care Note  Patient: Jordan Berg  Procedure(s) Performed: EGD (ESOPHAGOGASTRODUODENOSCOPY)  Patient Location: PACU  Anesthesia Type:MAC  Level of Consciousness: awake, alert , and oriented  Airway & Oxygen Therapy: Patient Spontanous Breathing and Patient connected to face mask oxygen  Post-op Assessment: Report given to RN  Post vital signs: Reviewed and stable  Last Vitals:  Vitals Value Taken Time  BP 121/79 04/28/24 1208  Temp    Pulse 75 04/28/24 1210  Resp 12 04/28/24 1210  SpO2 100 % 04/28/24 1210  Vitals shown include unfiled device data.  Last Pain:  Vitals:   04/28/24 1017  TempSrc: Temporal  PainSc: 0-No pain         Complications: No notable events documented.

## 2024-04-28 NOTE — H&P (Signed)
 GASTROENTEROLOGY PROCEDURE H&P NOTE   Primary Care Physician: Shelvia Dick, MD  HPI: Jordan Berg is a 68 y.o. male who presents for EGD for follow-up of recent EMR Barrett's high-grade dysplasia with presentation to hospital with upper GI bleed and melena and acute blood loss anemia.  Past Medical History:  Diagnosis Date   Arthritis    mild   Cataract    forming   Colon polyps 01/20/2009; 11/24/18; 04/2019   2010: NON-adenomatous-recall 10 yrs.  11/2018->adenomatous. repeat 05/03/19 mo b/c of piecemeal resection of one of the polyps->small area at 11/2018 polypectomy site bx'd->benign.  +Hx of postpolypectomy bleeding-->colonoscopy was done 3d after 11/2018 polypectomy to fix this.   GERD (gastroesophageal reflux disease)    +Barrett's esoph 02/2024   Helicobacter pylori gastritis 02/2018   EGD+.  Stool antigen NEG after abx treatment.   Hepatic steatosis    Noted on MRI abd 12/2017 (done for RUQ pain). also noted on EUS.   Hypercholesterolemia 12/2017   Recommended statin 12/2017: pt chose [facility] and repeat chol 3 mo. Recommended statin 01/2020.   Kidney stone    Obesity, Class II, BMI 35-39.9    Primary hypogonadism in male    Managed by pt's urologist (including biannual DREs, Hbs and PSAs).   RUQ abdominal pain Dec 2018;Jan 2019   Labs nl, abd plain films nl, MRCP nl.  GI did EUS 2019, normal except chronic gastritis, + H pylori.  Stool antigen test NEG after abx tx.   Past Surgical History:  Procedure Laterality Date   CHOLECYSTECTOMY  2010   COLONOSCOPY     COLONOSCOPY N/A 11/25/2018   Adenomatous: recall 6 mo due to piecemeal resection of one of the polyps.  Procedure: COLONOSCOPY;  Surgeon: Brice Campi Albino Alu., MD;  Location: [facility];  Service: Gastroenterology;  Laterality: N/A;   COLONOSCOPY  11/28/2018   for post polypectomy bleeding.  Vessel cauterized/clipped   COLONOSCOPY  05/03/2019   f/u view of polypectomy site from 11/2018 was done due to  piecemeal resection. Bx ->inflam/benign. Recall 3 yrs.   COLONOSCOPY W/ POLYPECTOMY  01/20/2009   Multiple nonadenomatous polyps.  Recall 10 yrs.   ENDOSCOPIC MUCOSAL RESECTION N/A 04/21/2024   Procedure: RESECTION, MUCOSAL LESION, GI TRACT, ENDOSCOPIC;  Surgeon: Brice Campi, Albino Alu., MD;  Location: [facility];  Service: Gastroenterology;  Laterality: N/A;   ESOPHAGOGASTRODUODENOSCOPY  02/14/2018   +EUS: Chronic active gastritis, + H PYLORI, tx'd with abx by GI, stool antigen test NEG after abx (04/2018)   ESOPHAGOGASTRODUODENOSCOPY     02/2024, Barrett's esoph with focal high grade dysplasia-->plan for ablation   ESOPHAGOGASTRODUODENOSCOPY N/A 04/21/2024   Some low grade and high grade dysplasia on path. Procedure: EGD (ESOPHAGOGASTRODUODENOSCOPY);  Surgeon: Normie Becton., MD;  Location: [facility];  Service: Gastroenterology;  Laterality: N/A;   EUS N/A 02/14/2018   Procedure: UPPER ENDOSCOPIC ULTRASOUND (EUS) LINEAR;  Surgeon: Janel Medford, MD;  Location: [facility];  Service: Endoscopy;  Laterality: N/A;   HOT HEMOSTASIS N/A 11/25/2018   Procedure: HOT HEMOSTASIS (ARGON PLASMA COAGULATION/BICAP);  Surgeon: Normie Becton., MD;  Location: [facility];  Service: Gastroenterology;  Laterality: N/A;   KNEE SURGERY Left 2000 and 2017   arthroscopic   LUMBAR SPINE SURGERY  2000   No hardware   NASAL SEPTUM SURGERY  1981   POLYPECTOMY     SUBMUCOSAL INJECTION  11/25/2018   Procedure: SUBMUCOSAL INJECTION;  Surgeon: Normie Becton., MD;  Location: [facility];  Service: Gastroenterology;;  TONSILLECTOMY  1978   Current Facility-Administered Medications  Medication Dose Route Frequency Provider Last Rate Last Admin   0.9 %  sodium chloride  infusion   Intravenous Continuous Graciella Lavender, [place]       [facility] Hold] acetaminophen  (TYLENOL ) tablet 650 mg  650 mg Oral Q6H PRN Amponsah, Prosper M, MD       Or   Evette Hoes Hold] acetaminophen  (TYLENOL )  suppository 650 mg  650 mg Rectal Q6H PRN Amponsah, Prosper M, MD       [MAR Hold] melatonin tablet 3 mg  3 mg Oral QHS Amponsah, Prosper M, MD   3 mg at 04/27/24 2057   [facility] Hold] omega-3 acid ethyl esters (LOVAZA ) capsule 1 g  1 g Oral Daily Amponsah, Prosper M, MD   1 g at 04/27/24 0835   [MAR Hold] ondansetron  (ZOFRAN ) tablet 4 mg  4 mg Oral Q6H PRN Amponsah, Prosper M, MD       Or   Evette Hoes Hold] ondansetron  (ZOFRAN ) injection 4 mg  4 mg Intravenous Q6H PRN Amponsah, Prosper M, MD       [MAR Hold] pantoprazole  (PROTONIX ) injection 40 mg  40 mg Intravenous Q12H Amponsah, Prosper M, MD   40 mg at 04/28/24 0836   [MAR Hold] senna-docusate (Senokot-S) tablet 1 tablet  1 tablet Oral QHS PRN Amponsah, Prosper M, MD       [MAR Hold] sucralfate  (CARAFATE ) 1 GM/10ML suspension 1 g  1 g Oral Q6H Lajuan Pila, MD   1 g at 04/27/24 2330    Current Facility-Administered Medications:    0.9 %  sodium chloride  infusion, , Intravenous, Continuous, Lemmon, Kathy Parker, [place]   [facility] Hold] acetaminophen  (TYLENOL ) tablet 650 mg, 650 mg, Oral, Q6H PRN **OR** [MAR Hold] acetaminophen  (TYLENOL ) suppository 650 mg, 650 mg, Rectal, Q6H PRN, Amponsah, Annette Killings, MD   [MAR Hold] melatonin tablet 3 mg, 3 mg, Oral, QHS, Amponsah, Prosper M, MD, 3 mg at 04/27/24 2057   [facility] Hold] omega-3 acid ethyl esters (LOVAZA ) capsule 1 g, 1 g, Oral, Daily, Vita Grip, MD, 1 g at 04/27/24 0835   [MAR Hold] ondansetron  (ZOFRAN ) tablet 4 mg, 4 mg, Oral, Q6H PRN **OR** [MAR Hold] ondansetron  (ZOFRAN ) injection 4 mg, 4 mg, Intravenous, Q6H PRN, Amponsah, Annette Killings, MD   [MAR Hold] pantoprazole  (PROTONIX ) injection 40 mg, 40 mg, Intravenous, Q12H, Vita Grip, MD, 40 mg at 04/28/24 0836   [MAR Hold] senna-docusate (Senokot-S) tablet 1 tablet, 1 tablet, Oral, QHS PRN, Vita Grip, MD   [MAR Hold] sucralfate  (CARAFATE ) 1 GM/10ML suspension 1 g, 1 g, Oral, Q6H, Lajuan Pila, MD, 1 g at 04/27/24 2330 Allergies   Allergen Reactions   Levofloxacin Itching, Other (See Comments) and Hives    W[place]ps   Family History  Problem Relation Age of Onset   Arthritis Mother    Depression Mother    Alzheimer's disease Mother    Alcohol abuse Father    Arthritis Father    COPD Father    Hearing loss Father    Heart disease Father    Colon polyps Sister    Colon cancer Sister 11   Colon polyps Paternal Aunt    Colon cancer Paternal Aunt    Colon polyps Cousin    Colon cancer Cousin    Esophageal cancer Neg Hx    Rectal cancer Neg Hx    Stomach cancer Neg Hx    Social History   Socioeconomic History   Marital status: Married  Spouse name: Not on file   Number of children: Not on file   Years of education: Not on file   Highest education level: Not on file  Occupational History   Not on file  Tobacco Use   Smoking status: Former    Current packs/day: 0.00    Average packs/day: 1.5 packs/day for 3.0 years (4.5 ttl pk-yrs)    Types: Cigarettes    Start date: 12/11/1973    Quit date: 12/11/1976    Years since quitting: 47.4   Smokeless tobacco: Never  Vaping Use   Vaping status: Never Used  Substance and Sexual Activity   Alcohol use: No   Drug use: No   Sexual activity: Not on file  Other Topics Concern   Not on file  Social History Narrative   Not on file   Social Drivers of Health   Financial Resource Strain: Not on file  Food Insecurity: No Food Insecurity (04/27/2024)   Hunger Vital Sign    Worried About Running Out of Food in the Last Year: Never true    Ran Out of Food in the Last Year: Never true  Transportation Needs: No Transportation Needs (04/27/2024)   PRAPARE - Administrator, Civil Service (Medical): No    Lack of Transportation (Non-Medical): No  Physical Activity: Not on file  Stress: Not on file  Social Connections: Moderately Isolated (04/27/2024)   Social Connection and Isolation Panel [NHANES]    Frequency of Communication with Friends and Family:  More than three times a week    Frequency of Social Gatherings with Friends and Family: More than three times a week    Attends Religious Services: Never    Database administrator or Organizations: No    Attends Banker Meetings: Patient declined    Marital Status: Married  Catering manager Violence: Not At Risk (04/27/2024)   Humiliation, Afraid, Rape, and Kick questionnaire    Fear of Current or Ex-Partner: No    Emotionally Abused: No    Physically Abused: No    Sexually Abused: No    Physical Exam: Today's Vitals   04/28/24 0530 04/28/24 0846 04/28/24 0931 04/28/24 1017  BP: 117/73  132/77   Pulse: 86  79   Resp: 18  16 15   Temp: 97.8 F (36.6 C)  97.9 F (36.6 C) 97.9 F (36.6 C)  TempSrc:   Oral Temporal  SpO2: 96%  99% 99%  Weight:      Height:      PainSc:  0-No pain  0-No pain   Body mass index is 33.75 kg/m. GEN: NAD EYE: Sclerae anicteric ENT: MMM CV: Non-tachycardic GI: Soft, NT/ND NEURO:  Alert & Oriented x 3  Lab Results: Recent Labs    04/26/24 2126 04/27/24 0532 04/28/24 0717  WBC 12.7* 10.3 8.1  HGB 14.6 12.7* 12.3*  HCT 44.6 40.2 37.8*  PLT 239 182 171   BMET Recent Labs    04/26/24 2126 04/27/24 0532 04/28/24 0428  NA 132* 136 136  K 3.9 3.9 3.7  CL 100 106 104  CO2 23 25 24   GLUCOSE 135* 103* 91  BUN 50* 49* 33*  CREATININE 1.16 1.15 1.11  CALCIUM 8.9 8.5* 8.5*   LFT Recent Labs    04/26/24 2126  PROT 6.5  ALBUMIN 3.6  AST 25  ALT 36  ALKPHOS 42  BILITOT 0.8   PT/INR Recent Labs    04/28/24 0428  LABPROT 14.6  INR 1.1  Impression / Plan: This is a 68 y.o.male who presents for EGD for follow-up of recent EMR Barrett's high-grade dysplasia with presentation to hospital with upper GI bleed and melena and acute blood loss anemia.  The risks and benefits of endoscopic evaluation/treatment were discussed with the patient and/or family; these include but are not limited to the risk of perforation,  infection, bleeding, missed lesions, lack of diagnosis, severe illness requiring hospitalization, as well as anesthesia and sedation related illnesses.  The patient's history has been reviewed, patient examined, no change in status, and deemed stable for procedure.  The patient and/or family is agreeable to proceed.    Yong Henle, MD [facility] Office # [phone]

## 2024-04-28 NOTE — Plan of Care (Signed)

## 2024-04-28 NOTE — Progress Notes (Signed)
    Progress Note   Subjective  Hospital day #2 Chief Complaint: Melena, Anemia  Today, patient tells me he has had no further bowel movements since yesterday, and generally feels well with no abdominal pain.  Describes that he had a bleed status post polypectomy a few years back and now he has had this bleed, concerned about what this means for him or how to prevent it in the future.  He would prefer for us  to go ahead and do an EGD while he is here so that he can be reassured.   Objective   Vital signs in last 24 hours: Temp:  [97.6 F (36.4 C)-98.6 F (37 C)] 97.9 F (36.6 C) (05/19 1017) Pulse Rate:  [78-97] 79 (05/19 0931) Resp:  [15-18] 15 (05/19 1017) BP: (115-148)/(67-85) 132/77 (05/19 0931) SpO2:  [94 %-99 %] 99 % (05/19 1017) Last BM Date : 04/27/24 General:    white male in NAD Heart:  Regular rate and rhythm; no murmurs Lungs: Respirations even and unlabored, lungs CTA bilaterally Abdomen:  Soft, nontender and nondistended. Normal bowel sounds. Psych:  Cooperative. Normal mood and affect.  Intake/Output from previous day: 05/18 0701 - 05/19 0700 In: 570 [P.O.:570] Out: -   Lab Results: Recent Labs    04/26/24 2126 04/27/24 0532 04/28/24 0717  WBC 12.7* 10.3 8.1  HGB 14.6 12.7* 12.3*  HCT 44.6 40.2 37.8*  PLT 239 182 171   BMET Recent Labs    04/26/24 2126 04/27/24 0532 04/28/24 0428  NA 132* 136 136  K 3.9 3.9 3.7  CL 100 106 104  CO2 23 25 24   GLUCOSE 135* 103* 91  BUN 50* 49* 33*  CREATININE 1.16 1.15 1.11  CALCIUM 8.9 8.5* 8.5*   LFT Recent Labs    04/26/24 2126  PROT 6.5  ALBUMIN 3.6  AST 25  ALT 36  ALKPHOS 42  BILITOT 0.8   PT/INR Recent Labs    04/28/24 0428  LABPROT 14.6  INR 1.1    Assessment / Plan:   Assessment: 1.  Melena/anemia: Status post EGD on 5/12 with removal of Barrett's nodule followed by PuraStat, now with post EMR bleeding, hemodynamically stable, hemoglobin 12.7--> 12.3 overnight with no further  description of melena  Plan: 1.  Plan for EGD today at 1230.  Did review risks, benefits, limitations and alternatives and the patient agrees to proceed. 2.  Continue IV Protonix  40 mg twice daily 3.  Continue to monitor hemoglobin and transfusion as needed thus 7 4.  Please await further recommendations from Dr. Brice Campi after TAVR procedure. 5. Patient to remain NPO until after time of procedure.  Thank you for your kind consultation.   LOS: 1 day   Graciella Lavender  04/28/2024, 10:18 AM

## 2024-04-28 NOTE — Plan of Care (Signed)
   Problem: Education: Goal: Knowledge of General Education information will improve Description: Including pain rating scale, medication(s)/side effects and non-pharmacologic comfort measures Outcome: Progressing   Problem: Clinical Measurements: Goal: Will remain free from infection Outcome: Progressing Goal: Diagnostic test results will improve Outcome: Progressing

## 2024-04-29 ENCOUNTER — Other Ambulatory Visit (HOSPITAL_COMMUNITY): Payer: Self-pay

## 2024-04-29 DIAGNOSIS — K921 Melena: Secondary | ICD-10-CM | POA: Diagnosis not present

## 2024-04-29 DIAGNOSIS — K922 Gastrointestinal hemorrhage, unspecified: Secondary | ICD-10-CM | POA: Diagnosis not present

## 2024-04-29 DIAGNOSIS — D62 Acute posthemorrhagic anemia: Secondary | ICD-10-CM | POA: Diagnosis not present

## 2024-04-29 LAB — CBC
HCT: 36.6 % — ABNORMAL LOW (ref 39.0–52.0)
Hemoglobin: 12.2 g/dL — ABNORMAL LOW (ref 13.0–17.0)
MCH: 32.4 pg (ref 26.0–34.0)
MCHC: 33.3 g/dL (ref 30.0–36.0)
MCV: 97.3 fL (ref 80.0–100.0)
Platelets: 162 10*3/uL (ref 150–400)
RBC: 3.76 MIL/uL — ABNORMAL LOW (ref 4.22–5.81)
RDW: 13.1 % (ref 11.5–15.5)
WBC: 6.4 10*3/uL (ref 4.0–10.5)
nRBC: 0.3 % — ABNORMAL HIGH (ref 0.0–0.2)

## 2024-04-29 LAB — BASIC METABOLIC PANEL WITH GFR
Anion gap: 7 (ref 5–15)
BUN: 20 mg/dL (ref 8–23)
CO2: 27 mmol/L (ref 22–32)
Calcium: 8.6 mg/dL — ABNORMAL LOW (ref 8.9–10.3)
Chloride: 102 mmol/L (ref 98–111)
Creatinine, Ser: 1.19 mg/dL (ref 0.61–1.24)
GFR, Estimated: >60 mL/min (ref >60)
Glucose, Bld: 91 mg/dL (ref 70–99)
Potassium: 3.4 mmol/L — ABNORMAL LOW (ref 3.5–5.1)
Sodium: 136 mmol/L (ref 135–145)

## 2024-04-29 MED ORDER — POTASSIUM CHLORIDE CRYS ER 20 MEQ PO TBCR
40.0000 meq | EXTENDED_RELEASE_TABLET | Freq: Once | ORAL | Status: AC
  Administered 2024-04-29: 40 meq via ORAL
  Filled 2024-04-29: qty 2

## 2024-04-29 MED ORDER — SUCRALFATE 1 G PO TABS
1.0000 g | ORAL_TABLET | Freq: Four times a day (QID) | ORAL | 0 refills | Status: DC
  Filled 2024-04-29: qty 70, 18d supply, fill #0

## 2024-04-29 MED ORDER — SUCRALFATE 1 GM/10ML PO SUSP
1.0000 g | Freq: Three times a day (TID) | ORAL | 0 refills | Status: DC
  Filled 2024-04-29: qty 473, 12d supply, fill #0

## 2024-04-29 MED ORDER — PANTOPRAZOLE SODIUM 40 MG PO TBEC
40.0000 mg | DELAYED_RELEASE_TABLET | Freq: Two times a day (BID) | ORAL | 0 refills | Status: DC
  Filled 2024-04-29: qty 60, 30d supply, fill #0

## 2024-04-29 NOTE — Anesthesia Postprocedure Evaluation (Signed)
 Anesthesia Post Note  Patient: Hazle Lites  Procedure(s) Performed: EGD (ESOPHAGOGASTRODUODENOSCOPY)     Patient location during evaluation: Endoscopy Anesthesia Type: MAC Level of consciousness: awake and alert Pain management: pain level controlled Vital Signs Assessment: post-procedure vital signs reviewed and stable Respiratory status: spontaneous breathing, nonlabored ventilation, respiratory function stable and patient connected to nasal cannula oxygen Cardiovascular status: stable and blood pressure returned to baseline Postop Assessment: no apparent nausea or vomiting Anesthetic complications: no   No notable events documented.  Last Vitals:  Vitals:   04/28/24 2012 04/29/24 0552  BP: (!) 114/94 121/85  Pulse: 86 82  Resp: 18 18  Temp: 36.6 C 36.4 C  SpO2: 96% 96%    Last Pain:  Vitals:   04/29/24 0900  TempSrc:   PainSc: 0-No pain                 Roselee Tayloe S

## 2024-04-29 NOTE — Plan of Care (Signed)
   Problem: Clinical Measurements: Goal: Respiratory complications will improve Outcome: Progressing

## 2024-04-29 NOTE — Progress Notes (Signed)
 Discharge instructions given to patient and wife questions asked and answered. Sylvia Everts  RN

## 2024-04-29 NOTE — Progress Notes (Signed)
    Progress Note   Subjective  Hospital day #3 Chief Complaint: Melena, anemia  EGD 5/19 with no gross lesions in the proximal esophagus and in the midesophagus, esophageal ulcer from recent EMR with stigmata of NB VV in the distal aspect of the resection site, injected with epinephrine  and treated with bipolar cautery as well as clipped  Overnight the patient has done well, no further episodes of bleeding and hemoglobin stable.  He is ready to go home.  Does have some questions about procedure and course going forward.    Objective   Vital signs in last 24 hours: Temp:  [97.5 F (36.4 C)-98.4 F (36.9 C)] 97.6 F (36.4 C) (05/20 0552) Pulse Rate:  [77-88] 82 (05/20 0552) Resp:  [12-18] 18 (05/20 0552) BP: (114-146)/(74-94) 121/85 (05/20 0552) SpO2:  [96 %-100 %] 96 % (05/20 0552) Last BM Date : 04/29/24 General:    white male in NAD Heart:  Regular rate and rhythm; no murmurs Lungs: Respirations even and unlabored, lungs CTA bilaterally Abdomen:  Soft, nontender and nondistended. Normal bowel sounds. Psych:  Cooperative. Normal mood and affect.  Intake/Output from previous day: 05/19 0701 - 05/20 0700 In: 1440 [P.O.:1440] Out: -    Lab Results: Recent Labs    04/27/24 0532 04/28/24 0717 04/29/24 0504  WBC 10.3 8.1 6.4  HGB 12.7* 12.3* 12.2*  HCT 40.2 37.8* 36.6*  PLT 182 171 162   BMET Recent Labs    04/27/24 0532 04/28/24 0428 04/29/24 0504  NA 136 136 136  K 3.9 3.7 3.4*  CL 106 104 102  CO2 25 24 27   GLUCOSE 103* 91 91  BUN 49* 33* 20  CREATININE 1.15 1.11 1.19  CALCIUM 8.5* 8.5* 8.6*   LFT Recent Labs    04/26/24 2126  PROT 6.5  ALBUMIN 3.6  AST 25  ALT 36  ALKPHOS 42  BILITOT 0.8   PT/INR Recent Labs    04/28/24 0428  LABPROT 14.6  INR 1.1    Assessment / Plan:   Assessment: 1.  Melena/anemia: Status post EGD on 5/12 with removal of Barrett's nodule followed by.  Stat, in the hospital now status post EMR bleeding, EGD repeated  yesterday and area treated with epinephrine  and clipped, hemoglobin remained stable, no further episodes of melena  Plan: 1.  Patient ready to be discharged home today.  Please see recommendations per Dr. Marolyn Sis procedure note 2.  Patient asked today if he had to switch to Pantoprazole  since this is what was sent in but he does not.  He has Omeprazole  at home and can continue 40 mg twice a day of that.  He has a mass quantity. 3.  Continue Carafate  liquid slurry 4 times a day, discussed timing of this. 4.  Patient will need repeat endoscopy in 3 to 4 months for RFA-will coordinate with Dr. Brice Berg  Thank you for your kind consultation, we will sign off.   LOS: 2 days   Jordan Berg  04/29/2024, 10:15 AM

## 2024-04-29 NOTE — Progress Notes (Signed)
 Patient discharged to home, AVS reviewed and IV removed by flow RN. Prescriptions delivered to the bedside. Family to provide transportation.

## 2024-04-30 ENCOUNTER — Encounter (HOSPITAL_COMMUNITY): Payer: Self-pay | Admitting: Gastroenterology

## 2024-04-30 ENCOUNTER — Telehealth: Payer: Self-pay

## 2024-04-30 NOTE — Telephone Encounter (Signed)
 Noted: nurse phone contact with patient for TCM. Signed:  Arletha Lady, MD           04/30/2024

## 2024-04-30 NOTE — Transitions of Care (Post Inpatient/ED Visit) (Signed)
 04/30/2024  Name: Jordan Berg MRN: [id number] DOB: 10/04/1956  Today's TOC FU Call Status: Today's TOC FU Call Status:: Successful TOC FU Call Completed TOC FU Call Complete Date: 04/30/24 Patient's Name and Date of Birth confirmed.  Transition Care Management Follow-up Telephone Call Date of Discharge: 04/29/24 [facility]charge Facility: [facility]) Type of Discharge: Inpatient Admission Primary Inpatient Discharge Diagnosis:: GI bLeed How have you been since you were released from the hospital?: Better Any questions or concerns?: No  Items Reviewed: Did you receive and understand the discharge instructions provided?: Yes Medications obtained,verified, and reconciled?: Yes (Medications Reviewed) Any new allergies since your discharge?: No Dietary orders reviewed?: Yes Do you have support at home?: Yes People in Home [RPT]: spouse  Medications Reviewed Today: Medications Reviewed Today     Reviewed by Darrall Ellison, LPN (Licensed Practical Nurse) on 04/30/24 at 1046  Med List Status: <None>   Medication Order Taking? Sig Documenting Provider Last Dose Status Informant  acetaminophen -codeine  120-12 MG/5ML solution [id number] No Take 10 mLs by mouth every 6 (six) hours as needed for moderate pain (pain score 4-6). Mansouraty, Albino Alu., MD Past Week Active Self, Pharmacy Records  Apoaequorin (PREVAGEN PO) [id number] No Take by mouth. [provider] 04/25/2024 Morning Active Self, Pharmacy Records  B-D 3CC LUER-LOK SYR 22GX1-1/2 22G X 1-1/2" 3 ML MISC [id number] No  [provider] Past Week Active Self, Pharmacy Records  carboxymethylcellulose (REFRESH PLUS) 0.5 % SOLN [id number] No Place 1 drop into both eyes 3 (three) times daily as needed (dry eyes). [provider] Unknown Active Self, Pharmacy Records  ciclopirox  (PENLAC ) 8 % solution [id number] No Apply topically at bedtime. Apply over nail and surrounding skin. Apply daily over previous coat.  After seven (7) days, may remove with alcohol and continue cycle. Jennefer Moats, DPM Past Week Active Self, Pharmacy Records  clobetasol cream (TEMOVATE) 0.05 % [id number] No Apply 1 application  topically daily as needed (dry skin). [provider] Unknown Active Self, Pharmacy Records  Coenzyme Q10 (CO Q 10 PO) [id number] No Take by mouth daily. 2 teaspoon [provider] Past Week Active Self, Pharmacy Records  cyanocobalamin  (VITAMIN B12) 1000 MCG tablet [id number] No Take 1,000 mcg by mouth daily. [provider] Past Week Active Self, Pharmacy Records  ferrous sulfate  324 MG TBEC [id number] No Take 324 mg by mouth. [provider] Past Week Active Self, Pharmacy Records  Garlic 2000 MG TBEC [id number] No Take by mouth. [provider] Past Week Active Self, Pharmacy Records  lidocaine  (XYLOCAINE ) 2 % solution [id number] No TAKE 30 MLS BY MOUTH IN THE MORNING, AT NOON, IN THE EVENING, AND AT BEDTIME. SWISH AND SWALLOW 30 ML FOUR TIMES DAILY Mansouraty, Albino Alu., MD Past Week Active Self, Pharmacy Records  Misc Natural Products (PROSTATE HEALTH PO) [id number] No Take 1 capsule by mouth daily. [provider] Past Week Active Self, Pharmacy Records  Omega-3 Fatty Acids (FISH OIL) 1000 MG CAPS [id number] No Take 1 capsule by mouth daily.  [provider] Past Week Active Self, Pharmacy Records  pantoprazole  (PROTONIX ) 40 MG tablet [id number]  Take 1 tablet (40 mg total) by mouth 2 (two) times daily. Ephriam Hashimoto, MD  Active   psyllium (METAMUCIL) 58.6 % powder [id number] No Take 1 packet by mouth 3 (three) times daily. [provider] Unknown Active Self, Pharmacy Records  Salicylic Acid 3 % OINT [id number] No Apply 1 application  topically daily as needed (for remove dry, dead skin). [provider] Unknown Active Self, Pharmacy Records  sucralfate  (CARAFATE ) 1 GM/10ML suspension [id number]  Take 10 mLs (1 g total) by  mouth 4 (four) times daily -  with meals and at bedtime. Ephriam Hashimoto, MD  Active   testosterone  cypionate (DEPOTESTOSTERONE CYPIONATE) 200 MG/ML injection [id number] No Inject 60 mg into the muscle every Thursday.  [provider] 04/24/2024 Active Self, Pharmacy Records  Turmeric (QC TUMERIC COMPLEX PO) [id number] No Take by mouth. [provider] Unknown Active Self, Pharmacy Records            Home Care and Equipment/Supplies: Were Home Health Services Ordered?: NA Any new equipment or medical supplies ordered?: NA  Functional Questionnaire: Do you need assistance with bathing/showering or dressing?: No Do you need assistance with meal preparation?: No Do you need assistance with eating?: No Do you have difficulty maintaining continence: No Do you need assistance with getting out of bed/getting out of a chair/moving?: No Do you have difficulty managing or taking your medications?: No  Follow up appointments reviewed: PCP Follow-up appointment confirmed?: No (declined) MD Provider Line Number:[phone] Given: No Specialist Hospital Follow-up appointment confirmed?: No Reason Specialist Follow-Up Not Confirmed: Patient has Specialist Provider Number and will Call for Appointment Do you need transportation to your follow-up appointment?: No Do you understand care options if your condition(s) worsen?: Yes-patient verbalized understanding    SIGNATURE Darrall Ellison, LPN [facility] Nurse Health Advisor Direct Dial [phone]

## 2024-05-02 ENCOUNTER — Encounter: Payer: Self-pay | Admitting: Family Medicine

## 2024-05-02 ENCOUNTER — Ambulatory Visit: Payer: Self-pay | Admitting: Family Medicine

## 2024-05-02 ENCOUNTER — Ambulatory Visit: Admitting: Family Medicine

## 2024-05-02 VITALS — BP 106/70 | HR 86 | Temp 98.6°F | Ht 71.0 in | Wt 244.0 lb

## 2024-05-02 DIAGNOSIS — D509 Iron deficiency anemia, unspecified: Secondary | ICD-10-CM | POA: Diagnosis not present

## 2024-05-02 DIAGNOSIS — K922 Gastrointestinal hemorrhage, unspecified: Secondary | ICD-10-CM

## 2024-05-02 LAB — CBC WITH DIFFERENTIAL/PLATELET
Basophils Absolute: 0.1 10*3/uL (ref 0.0–0.1)
Basophils Relative: 1 % (ref 0.0–3.0)
Eosinophils Absolute: 0.4 10*3/uL (ref 0.0–0.7)
Eosinophils Relative: 4.7 % (ref 0.0–5.0)
HCT: 35.8 % — ABNORMAL LOW (ref 39.0–52.0)
Hemoglobin: 12.4 g/dL — ABNORMAL LOW (ref 13.0–17.0)
Lymphocytes Relative: 19.7 % (ref 12.0–46.0)
Lymphs Abs: 1.8 10*3/uL (ref 0.7–4.0)
MCHC: 34.8 g/dL (ref 30.0–36.0)
MCV: 93.2 fl (ref 78.0–100.0)
Monocytes Absolute: 0.7 10*3/uL (ref 0.1–1.0)
Monocytes Relative: 7.5 % (ref 3.0–12.0)
Neutro Abs: 6 10*3/uL (ref 1.4–7.7)
Neutrophils Relative %: 67.1 % (ref 43.0–77.0)
Platelets: 233 10*3/uL (ref 150.0–400.0)
RBC: 3.84 Mil/uL — ABNORMAL LOW (ref 4.22–5.81)
RDW: 13.4 % (ref 11.5–15.5)
WBC: 8.9 10*3/uL (ref 4.0–10.5)

## 2024-05-02 NOTE — Progress Notes (Signed)
 05/02/2024  CC:  Chief Complaint  Patient presents with   Hospitalization Follow-up    Patient is a 68 y.o.male who presents accompanied by his wife Concha Deed for hospital follow up, specifically [facility] face-to-face visit. Dates hospitalized: 5/17 to 04/29/2024. Days since d/c from hospital: 3 days Patient was discharged from hospital to home. Reason for admission to hospital: GI bleed Date of interactive (phone) contact with patient and/or caregiver:04/30/24.  I have reviewed patient's discharge summary plus pertinent specific notes, labs, and imaging from the hospitalization.    CURRENTLY: Jordan Berg feels well, just a little bit tired. No dizziness, melena, hematochezia, abdominal pain, swallowing difficulties, or nausea or vomiting. He has been taking 65 mg iron over-the-counter chronically/long-term.  Medication reconciliation was done today and patient is taking meds as recommended by discharging hospitalist/specialist.   Discharge medication list: Omeprazole  40 mg twice daily, Carafate  liquid slurry 4 times a day.  PMH:  Past Medical History:  Diagnosis Date   Arthritis    mild   Cataract    forming   Colon polyps 01/20/2009; 11/24/18; 04/2019   2010: NON-adenomatous-recall 10 yrs.  11/2018->adenomatous. repeat 05/03/19 mo b/c of piecemeal resection of one of the polyps->small area at 11/2018 polypectomy site bx'd->benign.  +Hx of postpolypectomy bleeding-->colonoscopy was done 3d after 11/2018 polypectomy to fix this.   GERD (gastroesophageal reflux disease)    +Barrett's esoph 02/2024   Helicobacter pylori gastritis 02/2018   EGD+.  Stool antigen NEG after abx treatment.   Hepatic steatosis    Noted on MRI abd 12/2017 (done for RUQ pain). also noted on EUS.   Hypercholesterolemia 12/2017   Recommended statin 12/2017: pt chose TLC and repeat chol 3 mo. Recommended statin 01/2020.   Kidney stone    Obesity, Class II, BMI 35-39.9    Primary hypogonadism in male     Managed by pt's urologist (including biannual DREs, Hbs and PSAs).   RUQ abdominal pain Dec 2018;Jan 2019   Labs nl, abd plain films nl, MRCP nl.  GI did EUS 2019, normal except chronic gastritis, + H pylori.  Stool antigen test NEG after abx tx.   Upper GI bleed    04/2024 esoph ulcer at the site of recent bx/nodule resection    PSH:  Past Surgical History:  Procedure Laterality Date   CHOLECYSTECTOMY  2010   COLONOSCOPY     COLONOSCOPY N/A 11/25/2018   Adenomatous: recall 6 mo due to piecemeal resection of one of the polyps.  Procedure: COLONOSCOPY;  Surgeon: Brice Campi Albino Alu., MD;  Location: [facility];  Service: Gastroenterology;  Laterality: N/A;   COLONOSCOPY  11/28/2018   for post polypectomy bleeding.  Vessel cauterized/clipped   COLONOSCOPY  05/03/2019   f/u view of polypectomy site from 11/2018 was done due to piecemeal resection. Bx ->inflam/benign. Recall 3 yrs.   COLONOSCOPY W/ POLYPECTOMY  01/20/2009   Multiple nonadenomatous polyps.  Recall 10 yrs.   ENDOSCOPIC MUCOSAL RESECTION N/A 04/21/2024   Procedure: RESECTION, MUCOSAL LESION, GI TRACT, ENDOSCOPIC;  Surgeon: Brice Campi, Albino Alu., MD;  Location: [facility];  Service: Gastroenterology;  Laterality: N/A;   ESOPHAGOGASTRODUODENOSCOPY  02/14/2018   +EUS: Chronic active gastritis, + H PYLORI, tx'd with abx by GI, stool antigen test NEG after abx (04/2018)   ESOPHAGOGASTRODUODENOSCOPY     02/2024, Barrett's esoph with focal high grade dysplasia-->plan for ablation   ESOPHAGOGASTRODUODENOSCOPY N/A 04/21/2024   Some low grade and high grade dysplasia on path. Procedure: EGD (ESOPHAGOGASTRODUODENOSCOPY);  Surgeon: Normie Becton., MD;  Location: [facility];  Service: Gastroenterology;  Laterality: N/A;   ESOPHAGOGASTRODUODENOSCOPY N/A 04/28/2024   Procedure: EGD (ESOPHAGOGASTRODUODENOSCOPY);  Surgeon: Normie Becton., MD;  Location: [facility];  Service: Gastroenterology;  Laterality: N/A;    EUS N/A 02/14/2018   Procedure: UPPER ENDOSCOPIC ULTRASOUND (EUS) LINEAR;  Surgeon: Janel Medford, MD;  Location: [facility];  Service: Endoscopy;  Laterality: N/A;   HOT HEMOSTASIS N/A 11/25/2018   Procedure: HOT HEMOSTASIS (ARGON PLASMA COAGULATION/BICAP);  Surgeon: Normie Becton., MD;  Location: [facility];  Service: Gastroenterology;  Laterality: N/A;   KNEE SURGERY Left 2000 and 2017   arthroscopic   LUMBAR SPINE SURGERY  2000   No hardware   NASAL SEPTUM SURGERY  1981   POLYPECTOMY     SUBMUCOSAL INJECTION  11/25/2018   Procedure: SUBMUCOSAL INJECTION;  Surgeon: Normie Becton., MD;  Location: [facility];  Service: Gastroenterology;;   TONSILLECTOMY  1978   Social History   Socioeconomic History   Marital status: Married    Spouse name: Not on file   Number of children: Not on file   Years of education: Not on file   Highest education level: Not on file  Occupational History   Not on file  Tobacco Use   Smoking status: Former    Current packs/day: 0.00    Average packs/day: 1.5 packs/day for 3.0 years (4.5 ttl pk-yrs)    Types: Cigarettes    Start date: 12/11/1973    Quit date: 12/11/1976    Years since quitting: 47.4   Smokeless tobacco: Never  Vaping Use   Vaping status: Never Used  Substance and Sexual Activity   Alcohol use: No   Drug use: No   Sexual activity: Not on file  Other Topics Concern   Not on file  Social History Narrative   Not on file   Social Drivers of Health   Financial Resource Strain: Not on file  Food Insecurity: No Food Insecurity (04/27/2024)   Hunger Vital Sign    Worried About Running Out of Food in the Last Year: Never true    Ran Out of Food in the Last Year: Never true  Transportation Needs: No Transportation Needs (04/27/2024)   PRAPARE - Administrator, Civil Service (Medical): No    Lack of Transportation (Non-Medical): No  Physical Activity: Not on file  Stress: Not on file  Social  Connections: Moderately Isolated (04/27/2024)   Social Connection and Isolation Panel [NHANES]    Frequency of Communication with Friends and Family: More than three times a week    Frequency of Social Gatherings with Friends and Family: More than three times a week    Attends Religious Services: Never    Database administrator or Organizations: No    Attends Engineer, structural: Patient declined    Marital Status: Married    MEDS:  Outpatient Medications Prior to Visit  Medication Sig Dispense Refill   Apoaequorin (PREVAGEN PO) Take by mouth.     B-D 3CC LUER-LOK SYR 22GX1-1/2 22G X 1-1/2" 3 ML MISC      carboxymethylcellulose (REFRESH PLUS) 0.5 % SOLN Place 1 drop into both eyes 3 (three) times daily as needed (dry eyes).     ciclopirox  (PENLAC ) 8 % solution Apply topically at bedtime. Apply over nail and surrounding skin. Apply daily over previous coat. After seven (7) days, may remove with alcohol and continue cycle. 6.6 mL 7   clobetasol cream (TEMOVATE) 0.05 % Apply 1  application  topically daily as needed (dry skin).     Coenzyme Q10 (CO Q 10 PO) Take by mouth daily. 2 teaspoon     cyanocobalamin  (VITAMIN B12) 1000 MCG tablet Take 1,000 mcg by mouth daily.     ferrous sulfate  324 MG TBEC Take 324 mg by mouth.     Garlic 2000 MG TBEC Take by mouth.     Misc Natural Products (PROSTATE HEALTH PO) Take 1 capsule by mouth daily.     Omega-3 Fatty Acids (FISH OIL) 1000 MG CAPS Take 1 capsule by mouth daily.      pantoprazole  (PROTONIX ) 40 MG tablet Take 1 tablet (40 mg total) by mouth 2 (two) times daily. 60 tablet 0   psyllium (METAMUCIL) 58.6 % powder Take 1 packet by mouth 3 (three) times daily.     Salicylic Acid 3 % OINT Apply 1 application  topically daily as needed (for remove dry, dead skin).     sucralfate  (CARAFATE ) 1 GM/10ML suspension Take 10 mLs (1 g total) by mouth 4 (four) times daily -  with meals and at bedtime. 1200 mL 0   testosterone  cypionate  (DEPOTESTOSTERONE CYPIONATE) 200 MG/ML injection Inject 60 mg into the muscle every Thursday.   1   Turmeric (QC TUMERIC COMPLEX PO) Take by mouth.     acetaminophen -codeine  120-12 MG/5ML solution Take 10 mLs by mouth every 6 (six) hours as needed for moderate pain (pain score 4-6). 120 mL 0   lidocaine  (XYLOCAINE ) 2 % solution TAKE 30 MLS BY MOUTH IN THE MORNING, AT NOON, IN THE EVENING, AND AT BEDTIME. SWISH AND SWALLOW 30 ML FOUR TIMES DAILY 550 mL 3   No facility-administered medications prior to visit.    Physical Exam    05/02/2024   11:10 AM 04/29/2024    5:52 AM 04/28/2024    8:12 PM  Vitals with BMI  Height 5\' 11"     Weight 244 lbs    BMI 34.05    Systolic 106 121 409  Diastolic 70 85 94  Pulse 86 82 86   Gen: Alert, well appearing.  Patient is oriented to person, place, time, and situation. AFFECT: pleasant, lucid thought and speech. Skin: No pallor CV: RRR, no m/r/g.   LUNGS: CTA bilat, nonlabored resps, good aeration in all lung fields. EXT: no clubbing or cyanosis.  no edema.     Pertinent labs/imaging Last CBC Lab Results  Component Value Date   WBC 6.4 04/29/2024   HGB 12.2 (L) 04/29/2024   HCT 36.6 (L) 04/29/2024   MCV 97.3 04/29/2024   MCH 32.4 04/29/2024   RDW 13.1 04/29/2024   PLT 162 04/29/2024   Last metabolic panel Lab Results  Component Value Date   GLUCOSE 91 04/29/2024   NA 136 04/29/2024   K 3.4 (L) 04/29/2024   CL 102 04/29/2024   CO2 27 04/29/2024   BUN 20 04/29/2024   CREATININE 1.19 04/29/2024   GFRNONAA >60 04/29/2024   CALCIUM 8.6 (L) 04/29/2024   PROT 6.5 04/26/2024   ALBUMIN 3.6 04/26/2024   LABGLOB 2.1 11/10/2021   AGRATIO 2.1 11/10/2021   BILITOT 0.8 04/26/2024   ALKPHOS 42 04/26/2024   AST 25 04/26/2024   ALT 36 04/26/2024   ANIONGAP 7 04/29/2024   Lab Results  Component Value Date   IRON 116 01/04/2024   TIBC 347.2 01/04/2024   FERRITIN 105.1 01/04/2024   Lab Results  Component Value Date   CHOL 184 01/30/2020    HDL 39 (  L) 01/30/2020   LDLCALC 125 (H) 01/30/2020   TRIG 94 01/30/2020   CHOLHDL 4.7 01/30/2020    ASSESSMENT/PLAN:  #1 upper GI bleed--> postprocedural hemorrhage. Doing well status post cauterization/clip. Hemoglobin at discharge 3 days ago was 12.2. Monitor CBC today. Continue oral iron, omeprazole  40 mg twice daily, and Carafate .  #2 preventative health care:  PSA screening is done through his urologist, who also manages his testosterone  deficiency. Colonoscopy 2019, polyps, he is followed by Dr. Brice Campi in GI. Will recommend Prevnar 20 at next follow-up in 2 months.  Medical decision making of moderate complexity was utilized today  FOLLOW UP: 2 months  Signed:  Arletha Lady, MD           05/02/2024

## 2024-05-02 NOTE — Discharge Summary (Signed)
 Physician Discharge Summary   Patient: Jordan Berg MRN: [id number] DOB: 1956-09-09  Admit date:     04/26/2024  Discharge date: 04/29/2024  Discharge Physician: Ephriam Hashimoto   PCP: Shelvia Dick, MD     Recommendations at discharge:  Follow up with GI for post-mucosal resection bleeding     Discharge Diagnoses: Principal Problem:   Upper GI bleed Active Problems:   Melena   Barrett's esophagus with high grade dysplasia   Acute blood loss anemia   Obesity      Hospital Course: 68 y.o. M with obesity, Barrett's esophagus, recent EGD and mucosal resection who presented with melena, Hgb down to 14 from 17.  Admitted for overnight observation.    Acute upper GI bleed due to mucosal resection Patient was admitted and placed on a PPI.  GI were consulted and he underwent EGD that showed a nonbleeding visible vessel at the edge of his recent mucosal resection.  This was clipped by Dr. Brice Campi.  He was observed subsequently, hemoglobin remained stable, bowel movements turned brown, he was able to tolerate oral diet well.  Discharge with close GI follow-up  Acute blood loss anemia Baseline hemoglobin 17, trended down to 12 in the hospital, bowel movements cleared  Obesity Class I obesity, BMI 33             The [place]  Controlled Substances Registry was reviewed for this patient prior to discharge.   Consultants: GI, Dr. Brice Campi Procedures performed:  EGD  Disposition: Home Diet recommendation:  Regular diet  DISCHARGE MEDICATION: Allergies as of 04/29/2024       Reactions   Levofloxacin Itching, Other (See Comments), Hives   Whelps        Medication List     STOP taking these medications    omeprazole  40 MG capsule Commonly known as: PRILOSEC   sucralfate  1 g tablet Commonly known as: Carafate  Replaced by: sucralfate  1 GM/10ML suspension       TAKE these medications    B-D 3CC LUER-LOK SYR 22GX1-1/2 22G  X 1-1/2" 3 ML Misc Generic drug: SYRINGE-NEEDLE (DISP) 3 ML   carboxymethylcellulose 0.5 % Soln Commonly known as: REFRESH PLUS Place 1 drop into both eyes 3 (three) times daily as needed (dry eyes).   ciclopirox  8 % solution Commonly known as: PENLAC  Apply topically at bedtime. Apply over nail and surrounding skin. Apply daily over previous coat. After seven (7) days, may remove with alcohol and continue cycle.   clobetasol cream 0.05 % Commonly known as: TEMOVATE Apply 1 application  topically daily as needed (dry skin).   CO Q 10 PO Take by mouth daily. 2 teaspoon   cyanocobalamin  1000 MCG tablet Commonly known as: VITAMIN B12 Take 1,000 mcg by mouth daily.   ferrous sulfate  324 MG Tbec Take 324 mg by mouth.   Fish Oil 1000 MG Caps Take 1 capsule by mouth daily.   Garlic 2000 MG Tbec Take by mouth.   pantoprazole  40 MG tablet Commonly known as: PROTONIX  Take 1 tablet (40 mg total) by mouth 2 (two) times daily.   PREVAGEN PO Take by mouth.   PROSTATE HEALTH PO Take 1 capsule by mouth daily.   psyllium 58.6 % powder Commonly known as: METAMUCIL Take 1 packet by mouth 3 (three) times daily.   QC TUMERIC COMPLEX PO Take by mouth.   Salicylic Acid 3 % Oint Apply 1 application  topically daily as needed (for remove dry, dead skin).   sucralfate   1 GM/10ML suspension Commonly known as: CARAFATE  Take 10 mLs (1 g total) by mouth 4 (four) times daily -  with meals and at bedtime. Replaces: sucralfate  1 g tablet   testosterone  cypionate 200 MG/ML injection Commonly known as: DEPOTESTOSTERONE CYPIONATE Inject 60 mg into the muscle every Thursday.        Follow-up Information     McGowen, Minetta Aly, MD. Schedule an appointment as soon as possible for a visit in 1 week(s).   Specialty: Family Medicine Contact information: [address] [place] [address] [phone]         Mansouraty, Albino Alu., MD Follow up.   Specialties:  Gastroenterology, Internal Medicine Contact information: [address] [place] [address] [phone]                 Discharge Instructions     Discharge instructions   Complete by: As directed    **IMPORTANT DISCHARGE INSTRUCTIONS**   From Dr. Darlyn Eke: You were admitted with GI bleeding (the black "tar-like"stools are called "melena" and they are the appearance of blood that has reacted with stomach acid and traveled the length of the GI tract before coming out)  Thankfully, your hemoglobin level only dropped a moderate amount, not nearly enough to require transfusion  Continue oral iron  To protect the spot that was bleeding (a small vessel at the edge of the mucosal resection), continue Carafate  and change your antacid  STOP omeprazole  Take pantoprazole  40 mg twice daily  CONTINUE carafate  liquid four times daily  Go see your primary in 1 week for lab check  AVOID NSAIDs (like ibuprofen, Motrin, Advil, naproxen, Aleve, etc)  Go see Dr. Brice Campi as directed  You may resume a normal diet   Increase activity slowly   Complete by: As directed        Discharge Exam: Filed Weights   04/26/24 2040  Weight: 109.8 kg    General: Pt is alert, awake, not in acute distress Cardiovascular: RRR, nl S1-S2, no murmurs appreciated.   No LE edema.   Respiratory: Normal respiratory rate and rhythm.  CTAB without rales or wheezes. Abdominal: Abdomen soft and non-tender.  No distension or HSM.   Neuro/Psych: Strength symmetric in upper and lower extremities.  Judgment and insight appear normal.   Condition at discharge: good  The results of significant diagnostics from this hospitalization (including imaging, microbiology, ancillary and laboratory) are listed below for reference.   Imaging Studies: No results found.  Microbiology: Results for orders placed or performed in visit on 05/07/18  Helicobacter pylori special antigen     Status: None   Collection  Time: 05/07/18  2:36 PM  Result Value Ref Range Status   MICRO NUMBER: [id number]  Final   SPECIMEN QUALITY ADEQUATE  Final   SOURCE: STOOL  Final   STATUS: FINAL  Final   RESULT:   Final    Not Detected  Antimicrobials, proton pump inhibitors, and bismuth preparations inhibit H. pylori and ingestion up to two weeks prior to testing may cause false negative results. If clinically indicated the test should be repeated on a new specimen  obtained two weeks after discontinuing treatment.     Labs: CBC: Recent Labs  Lab 04/26/24 2126 04/27/24 0532 04/28/24 0717 04/29/24 0504 05/02/24 1149  WBC 12.7* 10.3 8.1 6.4 8.9  NEUTROABS  --   --   --   --  6.0  HGB 14.6 12.7* 12.3* 12.2* 12.4*  HCT 44.6 40.2  37.8* 36.6* 35.8*  MCV 97.2 101.8* 98.7 97.3 93.2  PLT 239 182 171 162 233.0   Basic Metabolic Panel: Recent Labs  Lab 04/26/24 2126 04/27/24 0532 04/28/24 0428 04/29/24 0504  NA 132* 136 136 136  K 3.9 3.9 3.7 3.4*  CL 100 106 104 102  CO2 23 25 24 27   GLUCOSE 135* 103* 91 91  BUN 50* 49* 33* 20  CREATININE 1.16 1.15 1.11 1.19  CALCIUM 8.9 8.5* 8.5* 8.6*   Liver Function Tests: Recent Labs  Lab 04/26/24 2126  AST 25  ALT 36  ALKPHOS 42  BILITOT 0.8  PROT 6.5  ALBUMIN 3.6   CBG: No results for input(s): "GLUCAP" in the last 168 hours.  Discharge time spent: approximately 25 minutes spent on discharge counseling, evaluation of patient on day of discharge, and coordination of discharge planning with nursing, social work, pharmacy and case management  Signed: Ephriam Hashimoto, MD [facility] 05/02/2024

## 2024-05-15 ENCOUNTER — Ambulatory Visit (INDEPENDENT_AMBULATORY_CARE_PROVIDER_SITE_OTHER): Admitting: Podiatrist

## 2024-05-15 DIAGNOSIS — B351 Tinea unguium: Secondary | ICD-10-CM

## 2024-05-15 NOTE — Progress Notes (Signed)
 Patient presents today for the laser treatment # 6. Diagnosed with mycotic nail infection by Dr. Sikora.   he has been applying penlac  nail laquer at home- relates he is seeing improvement- relates the right first nail has started to attach and general improvement in all nails has been noticed.     Toenails affected are all 10-  with most involvement bilateral first and bilateral fifth nails.   All other systems are negative.  Patients nails x 10  were filed thin using a sterile burr.    Laser therapy via [facility] Laser therapy system was admininstered to affected toenails x 10 with particular attention paid to bilateral hallux nails and bilateral fifth toenails .  The Patient tolerated the treatment well.   All safety precautions were in place.    Follow up in 4-6  weeks for laser # 7

## 2024-05-16 ENCOUNTER — Other Ambulatory Visit: Payer: Medicare Other

## 2024-05-22 ENCOUNTER — Telehealth: Payer: Self-pay | Admitting: Gastroenterology

## 2024-05-22 DIAGNOSIS — R5383 Other fatigue: Secondary | ICD-10-CM

## 2024-05-22 DIAGNOSIS — D649 Anemia, unspecified: Secondary | ICD-10-CM

## 2024-05-22 NOTE — Telephone Encounter (Signed)
 Lab order has been entered and pt aware

## 2024-05-22 NOTE — Telephone Encounter (Signed)
 He can come in next week for a CBC/iron/TIBC/ferritin/B12/folate. If he has evidence of iron deficiency, will consider video capsule endoscopy. No additional other workup/management changes at this time otherwise. GM

## 2024-05-22 NOTE — Telephone Encounter (Signed)
 05/02/24 PCP did labs and HgB is 12.4 he states that he still has some fatigue although he is much better. He wanted to know if he should repeat the labs now or wait a while. Please advise

## 2024-05-22 NOTE — Telephone Encounter (Signed)
 Inbound call from patient's wife stating patient is still very fatigued. States his red blood cell count has been low. Wife is requesting a call to discuss further. Also requesting to discuss if Dr. Brice Campi is able to order lab work to check levels again. Please advise, thank you.

## 2024-05-30 ENCOUNTER — Other Ambulatory Visit (INDEPENDENT_AMBULATORY_CARE_PROVIDER_SITE_OTHER)

## 2024-05-30 DIAGNOSIS — D649 Anemia, unspecified: Secondary | ICD-10-CM

## 2024-05-30 DIAGNOSIS — R5383 Other fatigue: Secondary | ICD-10-CM | POA: Diagnosis not present

## 2024-05-30 LAB — CBC WITH DIFFERENTIAL/PLATELET
Basophils Absolute: 0.1 10*3/uL (ref 0.0–0.1)
Basophils Relative: 1 % (ref 0.0–3.0)
Eosinophils Absolute: 0.4 10*3/uL (ref 0.0–0.7)
Eosinophils Relative: 7.1 % — ABNORMAL HIGH (ref 0.0–5.0)
HCT: 44.4 % (ref 39.0–52.0)
Hemoglobin: 15 g/dL (ref 13.0–17.0)
Lymphocytes Relative: 27.3 % (ref 12.0–46.0)
Lymphs Abs: 1.6 10*3/uL (ref 0.7–4.0)
MCHC: 33.7 g/dL (ref 30.0–36.0)
MCV: 93.1 fl (ref 78.0–100.0)
Monocytes Absolute: 0.7 10*3/uL (ref 0.1–1.0)
Monocytes Relative: 11.8 % (ref 3.0–12.0)
Neutro Abs: 3.1 10*3/uL (ref 1.4–7.7)
Neutrophils Relative %: 52.8 % (ref 43.0–77.0)
Platelets: 187 10*3/uL (ref 150.0–400.0)
RBC: 4.77 Mil/uL (ref 4.22–5.81)
RDW: 14.1 % (ref 11.5–15.5)
WBC: 5.8 10*3/uL (ref 4.0–10.5)

## 2024-05-30 LAB — FOLATE: Folate: 20.2 ng/mL (ref >5.9)

## 2024-05-30 LAB — IBC + FERRITIN
Ferritin: 28.9 ng/mL (ref 22.0–322.0)
Iron: 90 ug/dL (ref 42–165)
Saturation Ratios: 23.8 % (ref 20.0–50.0)
TIBC: 378 ug/dL (ref 250.0–450.0)
Transferrin: 270 mg/dL (ref 212.0–360.0)

## 2024-05-30 LAB — VITAMIN B12: Vitamin B-12: 858 pg/mL (ref 211–911)

## 2024-05-31 ENCOUNTER — Ambulatory Visit: Payer: Self-pay | Admitting: Gastroenterology

## 2024-06-26 ENCOUNTER — Ambulatory Visit: Admitting: Podiatry

## 2024-06-26 ENCOUNTER — Encounter: Payer: Self-pay | Admitting: Podiatry

## 2024-06-26 DIAGNOSIS — B351 Tinea unguium: Secondary | ICD-10-CM | POA: Diagnosis not present

## 2024-06-26 DIAGNOSIS — S46011A Strain of muscle(s) and tendon(s) of the rotator cuff of right shoulder, initial encounter: Secondary | ICD-10-CM | POA: Diagnosis not present

## 2024-06-26 NOTE — Progress Notes (Signed)
  Subjective:  Patient ID: Jordan Berg, male    DOB: December 14, 1955,   MRN: [id number]  Chief Complaint  Patient presents with   Nail Problem    The laser has done very well except for this right big toenail.  I use that polish and keep my shoes sprayed.    68 y.o. male presents for follow-up of fungal nails and to see progress with laser treatments. Has undergone 6 treatments thusfar . Has been using penlac  and has been spraying shoes with lysol. Denies any other pedal complaints. Denies n/v/f/c.   Past Medical History:  Diagnosis Date   Arthritis    mild   Cataract    forming   Colon polyps 01/20/2009; 11/24/18; 04/2019   2010: NON-adenomatous-recall 10 yrs.  11/2018->adenomatous. repeat 05/03/19 mo b/c of piecemeal resection of one of the polyps->small area at 11/2018 polypectomy site bx'd->benign.  +Hx of postpolypectomy bleeding-->colonoscopy was done 3d after 11/2018 polypectomy to fix this.   GERD (gastroesophageal reflux disease)    +Barrett's esoph 02/2024   Helicobacter pylori gastritis 02/2018   EGD+.  Stool antigen NEG after abx treatment.   Hepatic steatosis    Noted on MRI abd 12/2017 (done for RUQ pain). also noted on EUS.   Hypercholesterolemia 12/2017   Recommended statin 12/2017: pt chose TLC and repeat chol 3 mo. Recommended statin 01/2020.   Kidney stone    Obesity, Class II, BMI 35-39.9    Primary hypogonadism in male    Managed by pt's urologist (including biannual DREs, Hbs and PSAs).   RUQ abdominal pain Dec 2018;Jan 2019   Labs nl, abd plain films nl, MRCP nl.  GI did EUS 2019, normal except chronic gastritis, + H pylori.  Stool antigen test NEG after abx tx.   Upper GI bleed    04/2024 esoph ulcer at the site of recent bx/nodule resection    Objective:  Physical Exam: Vascular: DP/PT pulses 2/4 bilateral. CFT <3 seconds. Normal hair growth on digits. No edema.  Skin. No lacerations or abrasions bilateral feet. Bilateral hallux and fifth digits with  thickened and subungual debris. Right hallux with discoloration about 2/3 of the way back to proximal nail fold. Changes noted still to fifth digit nails as well.  Musculoskeletal: MMT 5/5 bilateral lower extremities in DF, PF, Inversion and Eversion. Deceased ROM in DF of ankle joint.  Neurological: Sensation intact to light touch.   Assessment:   1. Onychomycosis      Plan:  Patient was evaluated and treated and all questions answered. -Examined patient -Discussed treatment options for painful dystrophic nails  -Culture positive for fungus and some trauma. Recultured today to check for any residual fungus.  -Discussed fungal nail treatment options including oral, topical, and laser treatments.  -Continue laser and penlac  -Patient to return in 4 weeks   Asberry Failing, DPM

## 2024-06-27 DIAGNOSIS — M25511 Pain in right shoulder: Secondary | ICD-10-CM | POA: Diagnosis not present

## 2024-06-27 NOTE — Addendum Note (Signed)
 Addended by: WAYLAN ELIDIA PARAS on: 06/27/2024 01:25 PM   Modules accepted: Orders

## 2024-07-08 DIAGNOSIS — M25511 Pain in right shoulder: Secondary | ICD-10-CM | POA: Diagnosis not present

## 2024-07-11 ENCOUNTER — Ambulatory Visit: Payer: Medicare Other

## 2024-07-11 DIAGNOSIS — B351 Tinea unguium: Secondary | ICD-10-CM

## 2024-07-11 NOTE — Progress Notes (Signed)
 Patient presents today for the 7th laser treatment. Diagnosed with mycotic nail infection by Dr. Sikora.   Toenail most affected 1-5 bilaterally.  All other systems are negative. Patient may have today's treatment as his final treatment.   Nails were filed thin. Laser therapy was administered to 1-5 toenails bilaterally and patient tolerated the treatment well. All safety precautions were in place.    Follow up in 6-8weeks for laser # 8. Per Dr. Sikora, can do a few treatments at 6-8 week intervals.

## 2024-07-18 DIAGNOSIS — M75101 Unspecified rotator cuff tear or rupture of right shoulder, not specified as traumatic: Secondary | ICD-10-CM | POA: Diagnosis not present

## 2024-07-21 ENCOUNTER — Telehealth: Payer: Self-pay | Admitting: Podiatry

## 2024-07-21 NOTE — Telephone Encounter (Signed)
 Billing Clarification - Act #[id number] (DOS 07/11/2024)  Laser treatment charge of $100 was waived per [facility] documentation on 07/07/2024 (DO NOT CHARGE $100 per Shelly/Laser 7 Dr. Sikora).  Patient is calling because he received a bill for this amount. Please review and address accordingly.  Thank you

## 2024-07-23 ENCOUNTER — Telehealth: Payer: Self-pay | Admitting: Gastroenterology

## 2024-07-23 NOTE — Telephone Encounter (Signed)
 Patient is calling back Patty and stated that he would like Patty to return his call back. Patient also stated if we are not able to get him on the phone to please give his wife a call back at [phone]. Please advise.

## 2024-07-23 NOTE — Telephone Encounter (Signed)
 Inbound call from patient's wife requesting a call to discuss scheduling recall hospital endoscopy for September. Please advise, thank you

## 2024-07-23 NOTE — Telephone Encounter (Signed)
 Left message on machine to call back

## 2024-07-24 NOTE — Telephone Encounter (Signed)
 The pt as been advised that I will reach out today or tomorrow with appt information

## 2024-07-25 ENCOUNTER — Other Ambulatory Visit: Payer: Self-pay

## 2024-07-25 DIAGNOSIS — K2210 Ulcer of esophagus without bleeding: Secondary | ICD-10-CM

## 2024-07-25 DIAGNOSIS — K229 Disease of esophagus, unspecified: Secondary | ICD-10-CM

## 2024-07-25 NOTE — Telephone Encounter (Signed)
 EGD has been entered for 10/13 at 315 pm at [facility] with GM

## 2024-07-25 NOTE — Telephone Encounter (Signed)
EGD scheduled, pt instructed and medications reviewed.  Patient instructions mailed to home.  Patient to call with any questions or concerns.  

## 2024-07-31 ENCOUNTER — Encounter: Payer: Self-pay | Admitting: Podiatry

## 2024-07-31 ENCOUNTER — Ambulatory Visit: Admitting: Podiatry

## 2024-07-31 DIAGNOSIS — B351 Tinea unguium: Secondary | ICD-10-CM | POA: Diagnosis not present

## 2024-07-31 MED ORDER — CICLOPIROX 8 % EX SOLN: Freq: Every day | CUTANEOUS | 7 refills | Status: DC

## 2024-07-31 NOTE — Progress Notes (Signed)
  Subjective:  Patient ID: Jordan Berg, male    DOB: [birth year],   MRN: [id number]  No chief complaint on file.   68 y.o. male presents for follow-up of fungal nails and to see progress with laser treatments. Has undergone 6 treatments thusfar . Has been using penlac  and has been spraying shoes with lysol. Denies any other pedal complaints. Denies n/v/f/c.   Past Medical History:  Diagnosis Date   Arthritis    mild   Cataract    forming   Colon polyps 01/20/2009; 11/24/18; 04/2019   2010: NON-adenomatous-recall 10 yrs.  11/2018->adenomatous. repeat 05/03/19 mo b/c of piecemeal resection of one of the polyps->small area at 11/2018 polypectomy site bx'd->benign.  +Hx of postpolypectomy bleeding-->colonoscopy was done 3d after 11/2018 polypectomy to fix this.   GERD (gastroesophageal reflux disease)    +Barrett's esoph 02/2024   Helicobacter pylori gastritis 02/2018   EGD+.  Stool antigen NEG after abx treatment.   Hepatic steatosis    Noted on MRI abd 12/2017 (done for RUQ pain). also noted on EUS.   Hypercholesterolemia 12/2017   Recommended statin 12/2017: pt chose TLC and repeat chol 3 mo. Recommended statin 01/2020.   Kidney stone    Obesity, Class II, BMI 35-39.9    Primary hypogonadism in male    Managed by pt's urologist (including biannual DREs, Hbs and PSAs).   RUQ abdominal pain Dec 2018;Jan 2019   Labs nl, abd plain films nl, MRCP nl.  GI did EUS 2019, normal except chronic gastritis, + H pylori.  Stool antigen test NEG after abx tx.   Upper GI bleed    04/2024 esoph ulcer at the site of recent bx/nodule resection    Objective:  Physical Exam: Vascular: DP/PT pulses 2/4 bilateral. CFT <3 seconds. Normal hair growth on digits. No edema.  Skin. No lacerations or abrasions bilateral feet. Bilateral hallux and fifth digits with thickened and subungual debris. Right hallux with discoloration about 1/3 of the way back to proximal nail fold. Changes noted still to fifth digit  nails as well.  Musculoskeletal: MMT 5/5 bilateral lower extremities in DF, PF, Inversion and Eversion. Deceased ROM in DF of ankle joint.  Neurological: Sensation intact to light touch.   Assessment:   1. Onychomycosis       Plan:  Patient was evaluated and treated and all questions answered. -Examined patient -Discussed treatment options for painful dystrophic nails  -Culture positive for fungus and some trauma. Reculture does show some residual t rubrum still present.  -Discussed fungal nail treatment options including oral, topical, and laser treatments.  -Continue penalc at this time. Refill sent. Improving.  -Patient to return in 3 months   Jordan Berg, DPM

## 2024-08-01 DIAGNOSIS — M75101 Unspecified rotator cuff tear or rupture of right shoulder, not specified as traumatic: Secondary | ICD-10-CM | POA: Diagnosis not present

## 2024-08-22 DIAGNOSIS — M75101 Unspecified rotator cuff tear or rupture of right shoulder, not specified as traumatic: Secondary | ICD-10-CM | POA: Diagnosis not present

## 2024-09-05 ENCOUNTER — Other Ambulatory Visit: Payer: Medicare Other

## 2024-09-12 DIAGNOSIS — M75101 Unspecified rotator cuff tear or rupture of right shoulder, not specified as traumatic: Secondary | ICD-10-CM | POA: Diagnosis not present

## 2024-09-15 ENCOUNTER — Telehealth: Payer: Self-pay | Admitting: Gastroenterology

## 2024-09-15 NOTE — Telephone Encounter (Signed)
 Please advise at [phone]

## 2024-09-15 NOTE — Telephone Encounter (Addendum)
 Procedure:Endoscopy Procedure date: 09/22/24 Procedure location: [facility] Arrival Time: 2:45 pm Spoke with the patient Y/N: Yes Any prep concerns? No  Has the patient obtained the prep from the pharmacy ? No prep needed  Do you have a care partner and transportation: Yes Any additional concerns? No

## 2024-09-15 NOTE — Telephone Encounter (Signed)
Patient has procedure questions. Please advise

## 2024-09-15 NOTE — Telephone Encounter (Signed)
 Called and spoke with pt's spouse regarding questions about procedure. She reports that the letter the pt originally received stated the arrival time should be 1:45 PM. She reports that when the pt received the call today, he was instructed of a different arrival time of 2:45 PM. After reviewing appt information, the scheduled start time has been pushed back 1 hour. Discussed with spouse that the pt's new arrival time is 2:45 PM. She verbalized understanding.

## 2024-09-16 ENCOUNTER — Encounter (HOSPITAL_COMMUNITY): Payer: Self-pay | Admitting: Gastroenterology

## 2024-09-16 NOTE — Progress Notes (Signed)
 Attempted to obtain medical history for pre op call via telephone, unable to reach at this time. HIPAA compliant voicemail message left requesting return call to pre surgical testing department.

## 2024-09-22 ENCOUNTER — Encounter: Payer: Self-pay | Admitting: Gastroenterology

## 2024-09-22 ENCOUNTER — Telehealth: Payer: Self-pay | Admitting: Gastroenterology

## 2024-09-22 ENCOUNTER — Encounter (HOSPITAL_COMMUNITY): Admission: RE | Disposition: A | Payer: Self-pay | Source: Home / Self Care | Attending: Gastroenterology

## 2024-09-22 ENCOUNTER — Encounter (HOSPITAL_COMMUNITY): Payer: Self-pay | Admitting: Gastroenterology

## 2024-09-22 ENCOUNTER — Other Ambulatory Visit: Payer: Self-pay | Admitting: Gastroenterology

## 2024-09-22 ENCOUNTER — Ambulatory Visit (HOSPITAL_COMMUNITY)
Admission: RE | Admit: 2024-09-22 | Discharge: 2024-09-22 | Disposition: A | Discharge status: Home / Self Care | Attending: Gastroenterology | Admitting: Gastroenterology

## 2024-09-22 ENCOUNTER — Ambulatory Visit (HOSPITAL_COMMUNITY): Admitting: Anesthesiology

## 2024-09-22 ENCOUNTER — Telehealth: Payer: Self-pay

## 2024-09-22 ENCOUNTER — Other Ambulatory Visit: Payer: Self-pay

## 2024-09-22 DIAGNOSIS — Z87891 Personal history of nicotine dependence: Secondary | ICD-10-CM | POA: Diagnosis not present

## 2024-09-22 DIAGNOSIS — K22711 Barrett's esophagus with high grade dysplasia: Secondary | ICD-10-CM | POA: Insufficient documentation

## 2024-09-22 DIAGNOSIS — K449 Diaphragmatic hernia without obstruction or gangrene: Secondary | ICD-10-CM | POA: Diagnosis not present

## 2024-09-22 DIAGNOSIS — K2210 Ulcer of esophagus without bleeding: Secondary | ICD-10-CM | POA: Diagnosis present

## 2024-09-22 DIAGNOSIS — K922 Gastrointestinal hemorrhage, unspecified: Secondary | ICD-10-CM | POA: Diagnosis not present

## 2024-09-22 HISTORY — PX: ESOPHAGOGASTRODUODENOSCOPY: SHX5428

## 2024-09-22 SURGERY — EGD (ESOPHAGOGASTRODUODENOSCOPY)
Anesthesia: Monitor Anesthesia Care

## 2024-09-22 MED ORDER — FENTANYL CITRATE (PF) 100 MCG/2ML IJ SOLN
INTRAMUSCULAR | Status: DC | PRN
  Administered 2024-09-22 (×2): 50 ug via INTRAVENOUS

## 2024-09-22 MED ORDER — FENTANYL CITRATE (PF) 100 MCG/2ML IJ SOLN
INTRAMUSCULAR | Status: AC
  Filled 2024-09-22: qty 2

## 2024-09-22 MED ORDER — PROPOFOL 500 MG/50ML IV EMUL
INTRAVENOUS | Status: DC | PRN
  Administered 2024-09-22: 150 ug/kg/min via INTRAVENOUS

## 2024-09-22 MED ORDER — ACETAMINOPHEN-CODEINE 120-12 MG/5ML PO SOLN: 10.0000 mL | Freq: Four times a day (QID) | ORAL | 0 refills | Status: DC | PRN

## 2024-09-22 MED ORDER — AMBULATORY NON FORMULARY MEDICATION: 0 refills | Status: DC

## 2024-09-22 MED ORDER — SUCRALFATE 1 G PO TABS: 1.0000 g | ORAL_TABLET | Freq: Four times a day (QID) | ORAL | 1 refills | Status: AC

## 2024-09-22 MED ORDER — STERILE WATER FOR INJECTION IJ SOLN
RESPIRATORY_TRACT | Status: DC | PRN
  Administered 2024-09-22: 50 mL via OROMUCOSAL

## 2024-09-22 MED ORDER — OMEPRAZOLE 40 MG PO CPDR: 40.0000 mg | DELAYED_RELEASE_CAPSULE | Freq: Two times a day (BID) | ORAL | 12 refills | Status: AC

## 2024-09-22 MED ORDER — AMBULATORY NON FORMULARY MEDICATION: 0 refills | Status: AC

## 2024-09-22 MED ORDER — PROPOFOL 10 MG/ML IV BOLUS
INTRAVENOUS | Status: DC | PRN
  Administered 2024-09-22: 50 mg via INTRAVENOUS

## 2024-09-22 MED ORDER — SODIUM CHLORIDE 0.9 % IV SOLN
INTRAVENOUS | Status: AC | PRN
  Administered 2024-09-22: 500 mL via INTRAMUSCULAR

## 2024-09-22 MED ORDER — LIDOCAINE 2% (20 MG/ML) 5 ML SYRINGE
INTRAMUSCULAR | Status: DC | PRN
  Administered 2024-09-22: 50 mg via INTRAVENOUS

## 2024-09-22 MED ORDER — HYDROCODONE-ACETAMINOPHEN 7.5-325 MG/15ML PO SOLN: 15.0000 mL | Freq: Four times a day (QID) | ORAL | 0 refills | Status: AC | PRN

## 2024-09-22 MED ORDER — ACETYLCYSTEINE 20 % IN SOLN
RESPIRATORY_TRACT | Status: AC
  Filled 2024-09-22: qty 4

## 2024-09-22 NOTE — Telephone Encounter (Signed)
 New prescriptions will be sent to [facility].

## 2024-09-22 NOTE — Progress Notes (Signed)
 Rx for GI cocktail sent to [facility].

## 2024-09-22 NOTE — Telephone Encounter (Signed)
-----   Message from [facility] sent at 09/22/2024  3:53 PM EDT ----- Regarding: Follow-up EGD Jordan Berg, This patient needs repeat EGD in 3 to 4 months for Barrett's ablation in setting of high-grade dysplasia. Please place recall. Thanks. GM

## 2024-09-22 NOTE — Telephone Encounter (Signed)
 Acetaminophen -codeine  can not be filled at [facility]. [facility] states that they will have to special order it give bottle, not recommended ML on prescription.   Wife states that they have gotten this at [facility] in the past and would like it sent their. Pharmacy has been updated.   Also re-sending GI cocktail to [facility].

## 2024-09-22 NOTE — H&P (Signed)
 GASTROENTEROLOGY PROCEDURE H&P NOTE   Primary Care Physician: Candise Aleene DEL, MD  HPI: Jordan Berg is a 68 y.o. male who presents for EGD with RFA for Barrett's esophagus high-grade dysplasia/low-grade dysplasia (status post EMR a few months ago with subsequent bleeding requiring intervention).  Past Medical History:  Diagnosis Date   Arthritis    mild   Cataract    forming   Colon polyps 01/20/2009; 11/24/18; 04/2019   2010: NON-adenomatous-recall 10 yrs.  11/2018->adenomatous. repeat 05/03/19 mo b/c of piecemeal resection of one of the polyps->small area at 11/2018 polypectomy site bx'd->benign.  +Hx of postpolypectomy bleeding-->colonoscopy was done 3d after 11/2018 polypectomy to fix this.   GERD (gastroesophageal reflux disease)    +Barrett's esoph 02/2024   Helicobacter pylori gastritis 02/2018   EGD+.  Stool antigen NEG after abx treatment.   Hepatic steatosis    Noted on MRI abd 12/2017 (done for RUQ pain). also noted on EUS.   Hypercholesterolemia 12/2017   Recommended statin 12/2017: pt chose TLC and repeat chol 3 mo. Recommended statin 01/2020.   Kidney stone    Obesity, Class II, BMI 35-39.9    Primary hypogonadism in male    Managed by pt's urologist (including biannual DREs, Hbs and PSAs).   RUQ abdominal pain Dec 2018;Jan 2019   Labs nl, abd plain films nl, MRCP nl.  GI did EUS 2019, normal except chronic gastritis, + H pylori.  Stool antigen test NEG after abx tx.   Upper GI bleed    04/2024 esoph ulcer at the site of recent bx/nodule resection   Past Surgical History:  Procedure Laterality Date   CHOLECYSTECTOMY  2010   COLONOSCOPY     COLONOSCOPY N/A 11/25/2018   Adenomatous: recall 6 mo due to piecemeal resection of one of the polyps.  Procedure: COLONOSCOPY;  Surgeon: Wilhelmenia Aloha Raddle., MD;  Location: [facility];  Service: Gastroenterology;  Laterality: N/A;   COLONOSCOPY  11/28/2018   for post polypectomy bleeding.  Vessel cauterized/clipped    COLONOSCOPY  05/03/2019   f/u view of polypectomy site from 11/2018 was done due to piecemeal resection. Bx ->inflam/benign. Recall 3 yrs.   COLONOSCOPY W/ POLYPECTOMY  01/20/2009   Multiple nonadenomatous polyps.  Recall 10 yrs.   ENDOSCOPIC MUCOSAL RESECTION N/A 04/21/2024   Procedure: RESECTION, MUCOSAL LESION, GI TRACT, ENDOSCOPIC;  Surgeon: Wilhelmenia, Aloha Raddle., MD;  Location: [facility];  Service: Gastroenterology;  Laterality: N/A;   ESOPHAGOGASTRODUODENOSCOPY  02/14/2018   +EUS: Chronic active gastritis, + H PYLORI, tx'd with abx by GI, stool antigen test NEG after abx (04/2018)   ESOPHAGOGASTRODUODENOSCOPY     02/2024, Barrett's esoph with focal high grade dysplasia-->plan for ablation   ESOPHAGOGASTRODUODENOSCOPY N/A 04/21/2024   Some low grade and high grade dysplasia on path. Procedure: EGD (ESOPHAGOGASTRODUODENOSCOPY);  Surgeon: Wilhelmenia Aloha Raddle., MD;  Location: [facility];  Service: Gastroenterology;  Laterality: N/A;   ESOPHAGOGASTRODUODENOSCOPY N/A 04/28/2024   recent nodule resection site cauterized and clipped. Procedure: EGD (ESOPHAGOGASTRODUODENOSCOPY);  Surgeon: Wilhelmenia Aloha Raddle., MD;  Location: [facility];  Service: Gastroenterology;  Laterality: N/A;   EUS N/A 02/14/2018   Procedure: UPPER ENDOSCOPIC ULTRASOUND (EUS) LINEAR;  Surgeon: Teressa Toribio SQUIBB, MD;  Location: [facility];  Service: Endoscopy;  Laterality: N/A;   HOT HEMOSTASIS N/A 11/25/2018   Procedure: HOT HEMOSTASIS (ARGON PLASMA COAGULATION/BICAP);  Surgeon: Wilhelmenia Aloha Raddle., MD;  Location: [facility];  Service: Gastroenterology;  Laterality: N/A;   KNEE SURGERY Left 2000 and 2017   arthroscopic  LUMBAR SPINE SURGERY  2000   No hardware   NASAL SEPTUM SURGERY  1981   POLYPECTOMY     SUBMUCOSAL INJECTION  11/25/2018   Procedure: SUBMUCOSAL INJECTION;  Surgeon: Wilhelmenia Aloha Raddle., MD;  Location: [facility];  Service: Gastroenterology;;   TONSILLECTOMY  1978   Current  Facility-Administered Medications  Medication Dose Route Frequency Provider Last Rate Last Admin   0.9 %  sodium chloride  infusion    Continuous PRN Mansouraty, Aloha Raddle., MD 10 mL/hr at 09/22/24 1422 Restarted at 09/22/24 1443    Current Facility-Administered Medications:    0.9 %  sodium chloride  infusion, , , Continuous PRN, Mansouraty, Aloha Raddle., MD, Last Rate: 10 mL/hr at 09/22/24 1422, Restarted at 09/22/24 1443 Allergies  Allergen Reactions   Levofloxacin Itching, Other (See Comments) and Hives    [place]ps   Family History  Problem Relation Age of Onset   Arthritis Mother    Depression Mother    Alzheimer's disease Mother    Alcohol abuse Father    Arthritis Father    COPD Father    Hearing loss Father    Heart disease Father    Colon polyps Sister    Colon cancer Sister 102   Colon polyps Paternal Aunt    Colon cancer Paternal Aunt    Colon polyps Cousin    Colon cancer Cousin    Esophageal cancer Neg Hx    Rectal cancer Neg Hx    Stomach cancer Neg Hx    Social History   Socioeconomic History   Marital status: Married    Spouse name: Not on file   Number of children: Not on file   Years of education: Not on file   Highest education level: Not on file  Occupational History   Not on file  Tobacco Use   Smoking status: Former    Current packs/day: 0.00    Average packs/day: 1.5 packs/day for 3.0 years (4.5 ttl pk-yrs)    Types: Cigarettes    Start date: 12/11/1973    Quit date: 12/11/1976    Years since quitting: 47.8   Smokeless tobacco: Never  Vaping Use   Vaping status: Never Used  Substance and Sexual Activity   Alcohol use: No   Drug use: No   Sexual activity: Not on file  Other Topics Concern   Not on file  Social History Narrative   Not on file   Social Drivers of Health   Financial Resource Strain: Not on file  Food Insecurity: No Food Insecurity (04/27/2024)   Hunger Vital Sign    Worried About Running Out of Food in the Last Year: Never  true    Ran Out of Food in the Last Year: Never true  Transportation Needs: No Transportation Needs (04/27/2024)   PRAPARE - Administrator, Civil Service (Medical): No    Lack of Transportation (Non-Medical): No  Physical Activity: Not on file  Stress: Not on file  Social Connections: Moderately Isolated (04/27/2024)   Social Connection and Isolation Panel    Frequency of Communication with Friends and Family: More than three times a week    Frequency of Social Gatherings with Friends and Family: More than three times a week    Attends Religious Services: Never    Database administrator or Organizations: No    Attends Banker Meetings: Patient declined    Marital Status: Married  Catering manager Violence: Not At Risk (04/27/2024)   Humiliation, Afraid, Rape,  and Kick questionnaire    Fear of Current or Ex-Partner: No    Emotionally Abused: No    Physically Abused: No    Sexually Abused: No    Physical Exam: Today's Vitals   09/16/24 1214 09/22/24 1419  BP:  (!) 170/96  Pulse:  77  Resp:  16  Temp:  98.2 F (36.8 C)  TempSrc:  Temporal  SpO2:  96%  Weight: 110 kg 110 kg  Height:  5' 11 (1.803 m)  PainSc:  0-No pain   Body mass index is 33.82 kg/m. GEN: NAD EYE: Sclerae anicteric ENT: MMM CV: Non-tachycardic GI: Soft, NT/ND NEURO:  Alert & Oriented x 3  Lab Results: No results for input(s): WBC, HGB, HCT, PLT in the last 72 hours. BMET No results for input(s): NA, K, CL, CO2, GLUCOSE, BUN, CREATININE, CALCIUM in the last 72 hours. LFT No results for input(s): PROT, ALBUMIN, AST, ALT, ALKPHOS, BILITOT, BILIDIR, IBILI in the last 72 hours. PT/INR No results for input(s): LABPROT, INR in the last 72 hours.   Impression / Plan: This is a 68 y.o.male who presents for EGD with RFA for Barrett's esophagus high-grade dysplasia/low-grade dysplasia (status post EMR a few months ago with subsequent  bleeding requiring intervention).  The risks and benefits of endoscopic evaluation/treatment were discussed with the patient and/or family; these include but are not limited to the risk of perforation, infection, bleeding, missed lesions, lack of diagnosis, severe illness requiring hospitalization, as well as anesthesia and sedation related illnesses.  The patient's history has been reviewed, patient examined, no change in status, and deemed stable for procedure.  The patient and/or family is agreeable to proceed.    Aloha Finner, MD [facility] Office # [phone]

## 2024-09-22 NOTE — Transfer of Care (Signed)
 Immediate Anesthesia Transfer of Care Note  Patient: Norleen Vaughan Kerns  Procedure(s) Performed: EGD (ESOPHAGOGASTRODUODENOSCOPY)  Patient Location: Endoscopy Unit  Anesthesia Type:MAC  Level of Consciousness: awake  Airway & Oxygen Therapy: Patient Spontanous Breathing and Patient connected to nasal cannula oxygen  Post-op Assessment: Report given to RN and Post -op Vital signs reviewed and stable  Post vital signs: Reviewed and stable  Last Vitals:  Vitals Value Taken Time  BP    Temp    Pulse    Resp    SpO2      Last Pain:  Vitals:   09/22/24 1419  TempSrc: Temporal  PainSc: 0-No pain         Complications: No notable events documented.

## 2024-09-22 NOTE — Anesthesia Postprocedure Evaluation (Signed)
 Anesthesia Post Note  Patient: Jordan Berg  Procedure(s) Performed: EGD (ESOPHAGOGASTRODUODENOSCOPY)     Patient location during evaluation: PACU Anesthesia Type: MAC Level of consciousness: awake and alert Pain management: pain level controlled Vital Signs Assessment: post-procedure vital signs reviewed and stable Respiratory status: spontaneous breathing Cardiovascular status: stable Anesthetic complications: no   No notable events documented.  Last Vitals:  Vitals:   09/22/24 1550 09/22/24 1600  BP: (!) 152/94 (!) 138/96  Pulse: 83 69  Resp: 18 13  Temp:    SpO2: 95% 95%    Last Pain:  Vitals:   09/22/24 1600  TempSrc:   PainSc: 0-No pain                 Jordan Berg

## 2024-09-22 NOTE — Op Note (Signed)
 [facility] Patient Name: Jordan Berg Procedure Date: 09/22/2024 MRN: [id number] Attending MD: Aloha Finner , MD, [id number] Date of Birth: 1956-09-16 CSN: [id number] Age: 68 Admit Type: Outpatient Procedure:                Upper GI endoscopy Indications:              For endoscopic therapy of Barrett's esophagus with                            low grade dysplasia, For endoscopic therapy of                            Barrett's esophagus with high grade dysplasia, Post                            endoscopic mucosal resection for Barrett's                            esophagus with dysplasia Providers:                Aloha Finner, MD, Robie Breed, RN,                            [facility] [place], Technician, Renay Darner Referring MD:              Medicines:                Monitored Anesthesia Care Complications:            No immediate complications. Estimated Blood Loss:     Estimated blood loss was minimal. Procedure:                Pre-Anesthesia Assessment:                           - Prior to the procedure, a History and Physical                            was performed, and patient medications and                            allergies were reviewed. The patient's tolerance of                            previous anesthesia was also reviewed. The risks                            and benefits of the procedure and the sedation                            options and risks were discussed with the patient.                            All questions were answered, and informed consent  was obtained. Prior Anticoagulants: The patient has                            taken no anticoagulant or antiplatelet agents. ASA                            Grade Assessment: III - A patient with severe                            systemic disease. After reviewing the risks and                            benefits, the patient was deemed in  satisfactory                            condition to undergo the procedure.                           After obtaining informed consent, the endoscope was                            passed under direct vision. Throughout the                            procedure, the patient's blood pressure, pulse, and                            oxygen saturations were monitored continuously. The                            [id number]-H190 ([id number]) Olympus endoscope was introduced                            through the mouth, and advanced to the second part                            of duodenum. The upper GI endoscopy was                            accomplished without difficulty. The patient                            tolerated the procedure. Scope In: Scope Out: Findings:      No gross lesions were noted in the proximal esophagus and in the mid       esophagus.      The esophagus and gastroesophageal junction were examined with white       light and narrow band imaging (NBI) from a forward view and retroflexed       position. There were esophageal mucosal changes consistent with       short-segment Barrett's esophagus, classified as Barrett's stage C0-M2       per Prague criteria. These changes involved the mucosa along an       irregular Z-line (41 cm from the incisors). Three tongues of  salmon-colored mucosa were present from 39 to 41 cm. The maximum       longitudinal extent of these esophageal mucosal changes was 2 cm in       length. Focal radiofrequency ablation of Barrett's esophagus was       performed. With the endoscope in place, the position and extent of the       Barrett's mucosa and the anatomic landmarks including proximal and       distal extent of Barrett's mucosa and top of gastric folds were noted.       Endoscopic visualization identified an ablation site including the       entire visible Barrett's segment. The Barrett's mucosa was irrigated       with N-acetylcysteine  (Mucomyst )  1% mixed with water. Esophageal       contents were suctioned. The endoscope was then removed from the       patient. The Barrx-90 radiofrequency ablation catheter was attached to       the tip of the endoscope. The endoscope with the attached radiofrequency       ablation catheter was then passed transorally under direct vision into       the esophagus and advanced to the areas of Barrett's mucosa. The areas       included tongues of Barrett's mucosa. The radiofrequency ablation       catheter was placed in contact with the surface of the Barrett's mucosa       under direct visualization and energy was applied twice at 12 J/cm2.       Ablation was repeated in a likewise fashion to treat the entire area of       suspected Barrett's mucosa. The ablation zone was cleaned of coagulative       debris. The ablation catheter and endoscope were then removed and the       catheter was cleaned. The catheter and endoscope were reinserted into       the esophagus. A second round of ablation was then performed. Energy was       applied twice at 12 J/cm2 to retreat the areas of Barrett's epithelium       that had been treated with the first series of ablation. The areas of       the esophagus where Barrett's mucosa had been ablated were examined.       Areas of visible Barrett's esophagus were completely ablated. Whitish       changes of ablated mucosa were present. The total number of energy       applications for all mucosal sites treated was 42. There was no evidence       for perforation. Mild bleeding was present but abated before completion       of procedure. Patient has had multiple episodes of bleeding post       endoscopic procedures in the past, decision made to try to decrease this       risk by using PuraStat for additional hemostasis. Area was successfully       injected with 3 mL PuraStat for hemostasis.      A 2 cm hiatal hernia was present.      No gross lesions were noted in the entire  examined stomach.      No gross lesions were noted in the duodenal bulb, in the first portion       of the duodenum and in the second portion of the duodenum. Impression:               -  No gross lesions in the proximal esophagus and in                            the mid esophagus.                           - Esophageal mucosal changes consistent with                            short-segment Barrett's esophagus, classified as                            Barrett's stage C0-M2 per Prague criteria noted                            distally. Treated with radiofrequency ablation.                            Injected PuraStat.                           - 2 cm hiatal hernia.                           - No gross lesions in the entire stomach.                           - No gross lesions in the duodenal bulb, in the                            first portion of the duodenum and in the second                            portion of the duodenum. Moderate Sedation:      Not Applicable - Patient had care per Anesthesia. Recommendation:           - The patient will be observed post-procedure,                            until all discharge criteria are met.                           - Discharge patient to home.                           - Patient has a contact number available for                            emergencies. The signs and symptoms of potential                            delayed complications were discussed with the                            patient. Return to normal activities tomorrow.  Written discharge instructions were provided to the                            patient.                           - Clear liquid diet for 24 hours. Then, full liquid                            diet for 48 hours. Then, soft diet for 1 week                            thereafter. Then advance your diet thereafter to                            regular.                           - Continue  your PPI 40 mg twice daily for the next                            3 months (omeprazole  sent to [facility]).                           - You will be prescribed a lidocaine /antiacid                            mixture that you can take up to 4 times per day as                            needed (prescription has been sent to [facility]) - take no matter what for at least first                            3-days.                           - You will be prescribed liquid Tylenol  with                            codeine  that you can use up to 4 times daily for                            the next 72 hours if needed (prescription to be                            sent to [facility]) - I recommend using                            this even if you do not have pain for the first 2-3  days.                           - You will be prescribed Sucralfate  Suspension and                            you will take this 4 times daily (make sure you are                            not taking any other medication 1 hour before or 1                            hour after this medication is taken) to allow                            coating and healing of your esophagus - this needs                            to be taken for at least 49-month (you should have                            refills available but call if you run out and this                            was sent to [facility] pharmacy per your request). You may                            crush and dissolve the pills with 15 to 20 mL of                            fluid.                           - Monitor for signs/symptoms of bleeding,                            perforation, and infection. If issues please call                            our number to get further assistance as needed.                           - Repeat upper endoscopy in 4 months for                            retreatment.                            - The findings and recommendations were discussed                            with the patient.                           -  The findings and recommendations were discussed                            with the patient's family. Procedure Code(s):        --- Professional ---                           [phone], Esophagogastroduodenoscopy, flexible,                            transoral; with ablation of tumor(s), polyp(s), or                            other lesion(s) (includes pre- and post-dilation                            and guide wire passage, when performed)                           43255, 59, Esophagogastroduodenoscopy, flexible,                            transoral; with control of bleeding, any method Diagnosis Code(s):        --- Professional ---                           K44.9, Diaphragmatic hernia without obstruction or                            gangrene                           K22.711, Barrett's esophagus with high grade                            dysplasia                           Z09, Encounter for follow-up examination after                            completed treatment for conditions other than                            malignant neoplasm CPT copyright 2022 [employer]. All rights reserved. The codes documented in this report are preliminary and upon coder review may  be revised to meet current compliance requirements. Aloha Finner, MD 09/22/2024 3:49:06 PM Number of Addenda: 0

## 2024-09-22 NOTE — Telephone Encounter (Addendum)
 Prescription for GI Cocktail has been re-sent to [facility].   [facility] pharmacy does not have acetaminophen -codeine  solution.   They have hydrocodone 7.5mg  / Acetaminophen  325mg  per 15 ml solution. They will need new rx for this.

## 2024-09-22 NOTE — Discharge Instructions (Signed)

## 2024-09-22 NOTE — Addendum Note (Signed)
 Addended by: SUELLEN PEERS on: 09/22/2024 05:42 PM   Modules accepted: Orders

## 2024-09-22 NOTE — Anesthesia Procedure Notes (Signed)
 Procedure Name: MAC Date/Time: 09/22/2024 3:00 PM  Performed by: Judythe Tanda Aran, CRNAPre-anesthesia Checklist: Patient identified, Emergency Drugs available, Suction available and Patient being monitored Patient Re-evaluated:Patient Re-evaluated prior to induction Oxygen Delivery Method: Nasal cannula

## 2024-09-22 NOTE — Anesthesia Preprocedure Evaluation (Addendum)
 Anesthesia Evaluation  Patient identified by MRN, date of birth, ID band Patient awake    Reviewed: Allergy & Precautions, H&P , NPO status , Patient's Chart, lab work & pertinent test results  Airway Mallampati: III  TM Distance: >3 FB Neck ROM: Full    Dental no notable dental hx. (+) Dental Advisory Given   Pulmonary former smoker   Pulmonary exam normal breath sounds clear to auscultation       Cardiovascular negative cardio ROS Normal cardiovascular exam Rhythm:Regular Rate:Normal     Neuro/Psych    GI/Hepatic ,GERD  ,,GI bleeding   Endo/Other    Renal/GU Renal disease     Musculoskeletal  (+) Arthritis ,    Abdominal  (+) + obese  Peds  Hematology   Anesthesia Other Findings   Reproductive/Obstetrics                              Anesthesia Physical Anesthesia Plan  ASA: 2  Anesthesia Plan: MAC   Post-op Pain Management: Minimal or no pain anticipated   Induction: Intravenous  PONV Risk Score and Plan: 1 and Propofol  infusion, Treatment may vary due to age or medical condition and TIVA  Airway Management Planned: Nasal Cannula  Additional Equipment:   Intra-op Plan:   Post-operative Plan:   Informed Consent: I have reviewed the patients History and Physical, chart, labs and discussed the procedure including the risks, benefits and alternatives for the proposed anesthesia with the patient or authorized representative who has indicated his/her understanding and acceptance.     Dental advisory given  Plan Discussed with: CRNA  Anesthesia Plan Comments:         Anesthesia Quick Evaluation

## 2024-09-22 NOTE — Telephone Encounter (Signed)
 Spoke with patient's wife along with [facility]. They do not have the medication for the codeine  nor the GI cocktail. PT is asking that it be sent to [facility].

## 2024-09-22 NOTE — Telephone Encounter (Signed)
 Recall has been entered

## 2024-09-22 NOTE — Addendum Note (Signed)
 Addended by: WILHELMENIA ROERS on: 09/22/2024 05:47 PM   Modules accepted: Orders

## 2024-09-23 ENCOUNTER — Encounter (HOSPITAL_COMMUNITY): Payer: Self-pay | Admitting: Gastroenterology

## 2024-10-15 ENCOUNTER — Ambulatory Visit

## 2024-10-30 ENCOUNTER — Ambulatory Visit: Admitting: Podiatry

## 2024-10-30 ENCOUNTER — Encounter: Payer: Self-pay | Admitting: Podiatry

## 2024-10-30 DIAGNOSIS — B351 Tinea unguium: Secondary | ICD-10-CM | POA: Diagnosis not present

## 2024-10-30 MED ORDER — CICLOPIROX 8 % EX SOLN: Freq: Every day | CUTANEOUS | 7 refills | Status: AC

## 2024-10-30 NOTE — Progress Notes (Signed)
  Subjective:  Patient ID: Jordan Berg, male    DOB: 1956-07-22,   MRN: [id number]  Chief Complaint  Patient presents with   Nail Problem    Better but I think I made one of my nails hemorrhage underneath it.  My big toenail on my left foot flaked off.  She may need to cut my nails.    68 y.o. male presents for follow-up of fungal nails and to see progress He has continued to use the penlac . . He has been spraying shoes with lysol. Denies any other pedal complaints. Denies n/v/f/c.   Past Medical History:  Diagnosis Date   Arthritis    mild   Cataract    forming   Colon polyps 01/20/2009; 11/24/18; 04/2019   2010: NON-adenomatous-recall 10 yrs.  11/2018->adenomatous. repeat 05/03/19 mo b/c of piecemeal resection of one of the polyps->small area at 11/2018 polypectomy site bx'd->benign.  +Hx of postpolypectomy bleeding-->colonoscopy was done 3d after 11/2018 polypectomy to fix this.   GERD (gastroesophageal reflux disease)    +Barrett's esoph 02/2024   Helicobacter pylori gastritis 02/2018   EGD+.  Stool antigen NEG after abx treatment.   Hepatic steatosis    Noted on MRI abd 12/2017 (done for RUQ pain). also noted on EUS.   Hypercholesterolemia 12/2017   Recommended statin 12/2017: pt chose [facility] and repeat chol 3 mo. Recommended statin 01/2020.   Kidney stone    Obesity, Class II, BMI 35-39.9    Primary hypogonadism in male    Managed by pt's urologist (including biannual DREs, Hbs and PSAs).   RUQ abdominal pain Dec 2018;Jan 2019   Labs nl, abd plain films nl, MRCP nl.  GI did EUS 2019, normal except chronic gastritis, + H pylori.  Stool antigen test NEG after abx tx.   Upper GI bleed    04/2024 esoph ulcer at the site of recent bx/nodule resection    Objective:  Physical Exam: Vascular: DP/PT pulses 2/4 bilateral. CFT <3 seconds. Normal hair growth on digits. No edema.  Skin. No lacerations or abrasions bilateral feet. Bilateral hallux and fifth digits with thickened and  subungual debris. Right hallux with discoloration about 1/3 of the way back to proximal nail fold. Changes noted still to fifth digit nails as well.  Musculoskeletal: MMT 5/5 bilateral lower extremities in DF, PF, Inversion and Eversion. Deceased ROM in DF of ankle joint.  Neurological: Sensation intact to light touch.   Assessment:   1. Onychomycosis       Plan:  Patient was evaluated and treated and all questions answered. -Examined patient -Discussed treatment options for painful dystrophic nails  -Culture retaken again and sent to lab.  -Discussed fungal nail treatment options including oral, topical, and laser treatments.  -Continue penalc at this time. Refill sent. Improving.  -Patient to return after results. Will message.    Asberry Failing, DPM

## 2024-10-30 NOTE — Addendum Note (Signed)
 Addended by: WAYLAN ELIDIA PARAS on: 10/30/2024 10:42 AM   Modules accepted: Orders

## 2024-11-10 ENCOUNTER — Other Ambulatory Visit: Payer: Self-pay | Admitting: Podiatry

## 2024-11-11 ENCOUNTER — Ambulatory Visit: Payer: Self-pay | Admitting: Podiatry

## 2024-11-13 ENCOUNTER — Telehealth: Payer: Self-pay | Admitting: Gastroenterology

## 2024-11-13 NOTE — Telephone Encounter (Signed)
 Inbound call from patients wife stating that they need to schedule him for a recall EGD at the hospital. Patients wife is requesting to see if there is anyway he can get in before the end of the year due to insurance. Requesting a call back to discuss. Please advise.

## 2024-11-13 NOTE — Telephone Encounter (Signed)
 The pt has been advised that we have him in the recall system and will call him as soon as he is due for repeat testing.  I have also asked that he call back if he has not heard from us  by mid January

## 2024-12-23 ENCOUNTER — Other Ambulatory Visit: Payer: Self-pay

## 2024-12-23 DIAGNOSIS — K22719 Barrett's esophagus with dysplasia, unspecified: Secondary | ICD-10-CM

## 2024-12-23 NOTE — Telephone Encounter (Signed)
 Call from pt regarding getting scheduled for hospital EGD Pt stated he is scheduled for an appt and would like to discuss. Informed pt that he is not scheduled currently.  Please advise. Thank you

## 2024-12-23 NOTE — Telephone Encounter (Signed)
EGD scheduled, pt instructed and medications reviewed.  Patient instructions mailed to home and sent to My Chart .  Patient to call with any questions or concerns. ? ?

## 2024-12-23 NOTE — Telephone Encounter (Signed)
 EGD has been set up for 02/04/25 at 930 am at [facility] with [facility]

## 2025-02-04 ENCOUNTER — Ambulatory Visit (HOSPITAL_COMMUNITY): Admit: 2025-02-04 | Admitting: Gastroenterology

## 2025-02-04 ENCOUNTER — Encounter (HOSPITAL_COMMUNITY): Payer: Self-pay

## 2025-02-04 SURGERY — EGD (ESOPHAGOGASTRODUODENOSCOPY)
Anesthesia: Monitor Anesthesia Care
